# Patient Record
Sex: Male | Born: 1940 | Race: White | Hispanic: No | Smoking: Former smoker
Health system: Southern US, Community
[De-identification: ages and names within clinical notes are randomized; demographics above are authoritative.]

## PROBLEM LIST (undated history)

## (undated) DIAGNOSIS — I639 Cerebral infarction, unspecified: Secondary | ICD-10-CM

## (undated) DIAGNOSIS — I6529 Occlusion and stenosis of unspecified carotid artery: Secondary | ICD-10-CM

## (undated) DIAGNOSIS — T8859XA Other complications of anesthesia, initial encounter: Secondary | ICD-10-CM

## (undated) DIAGNOSIS — T4145XA Adverse effect of unspecified anesthetic, initial encounter: Secondary | ICD-10-CM

## (undated) DIAGNOSIS — I10 Essential (primary) hypertension: Secondary | ICD-10-CM

## (undated) HISTORY — DX: Cerebral infarction, unspecified: I63.9

## (undated) HISTORY — PX: NECK SURGERY: SHX720

## (undated) HISTORY — PX: PROSTATE SURGERY: SHX751

## (undated) HISTORY — DX: Essential (primary) hypertension: I10

## (undated) HISTORY — DX: Occlusion and stenosis of unspecified carotid artery: I65.29

## (undated) HISTORY — PX: LAPAROSCOPIC NEPHRECTOMY: SUR781

---
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
## 2019-05-24 ENCOUNTER — Encounter: Payer: Self-pay | Admitting: Cardiovascular Disease

## 2019-05-25 ENCOUNTER — Other Ambulatory Visit: Payer: Self-pay | Admitting: Family Medicine

## 2019-05-25 ENCOUNTER — Ambulatory Visit
Admission: RE | Admit: 2019-05-25 | Discharge: 2019-05-25 | Disposition: A | Discharge status: Home / Self Care | Payer: Medicare Other | Source: Ambulatory Visit | Attending: Family Medicine | Admitting: Family Medicine

## 2019-05-25 DIAGNOSIS — R55 Syncope and collapse: Secondary | ICD-10-CM

## 2019-05-25 IMAGING — CT CT NECK W/O CM
4 of 5 series · 14 of 33 positions shown, 16 images · non-contrast
Comparison: Head CT today reported separately.

CLINICAL DATA: 78-year-old male with syncope and dizziness this
month. Posterior neck pain. Dysphagia.

EXAM:
CT NECK WITHOUT CONTRAST
TECHNIQUE: Multidetector CT imaging of the neck was performed following the
standard protocol without intravenous contrast.

[Series 3: neck 2.00 br40 s3 soft tissue · axial · 0.49mm/px · z∈[-734,-696]mm · 2 of 113 slices shown]
[im 19/113  soft-tissue]
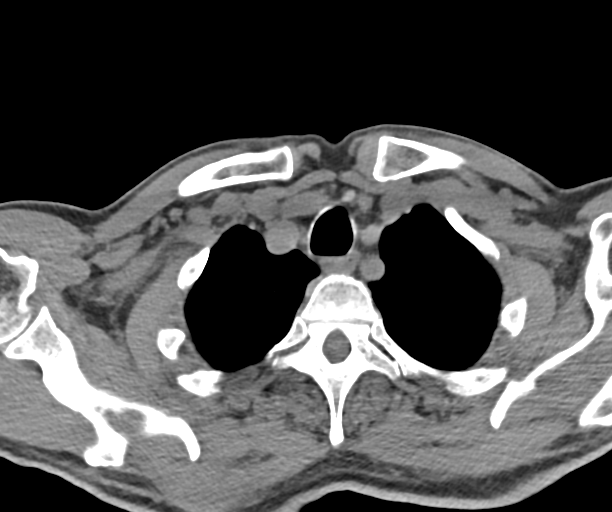
[im 38/113  soft-tissue]
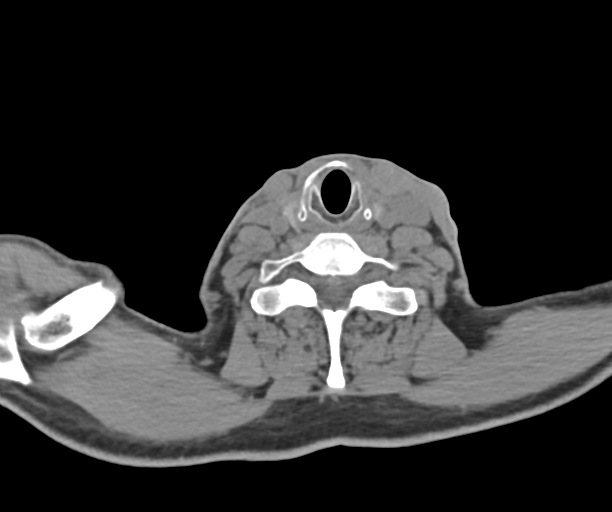

[Series 5: neck 2.00 br44 s3 soft tissue · sagittal · 0.44mm/px · 5 of 149 slices shown, 6 images]
[im 50/149  bone]
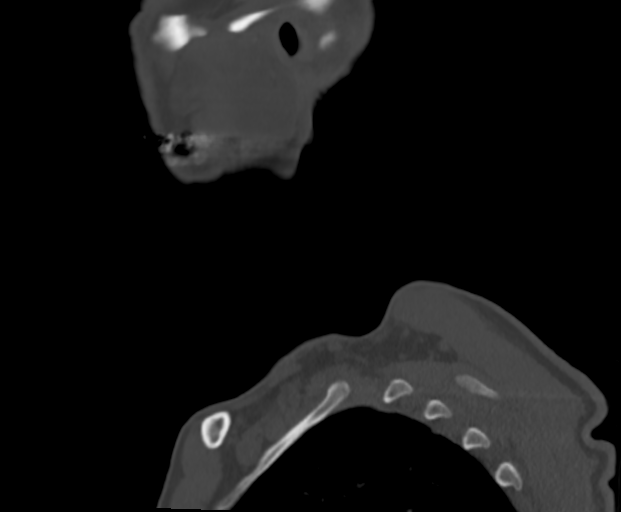
[im 62/149  bone]
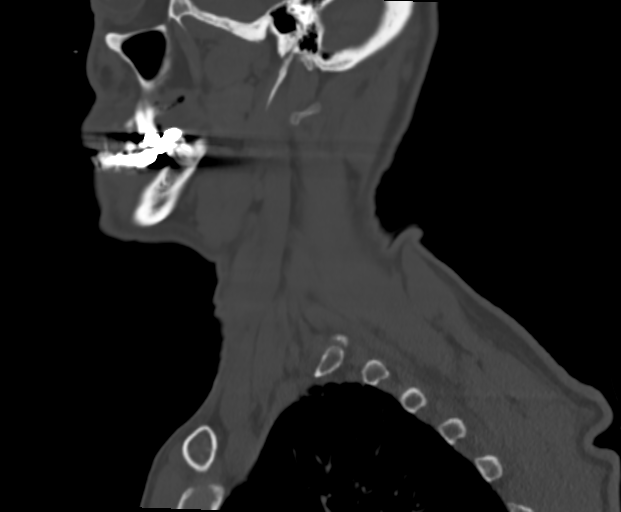
[im 75/149  soft-tissue]
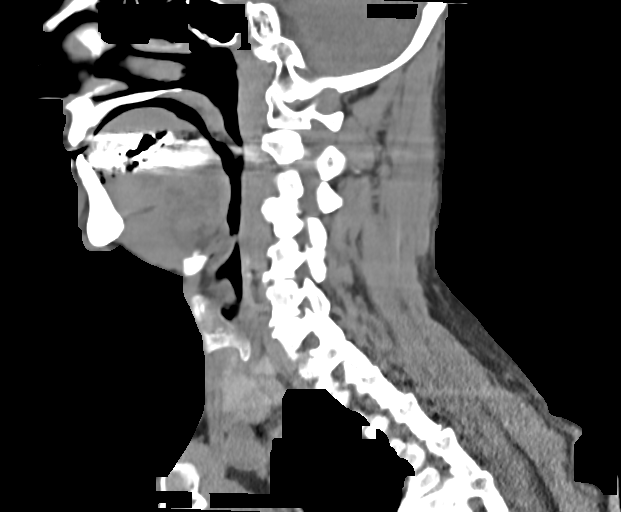
[im 75/149  bone]
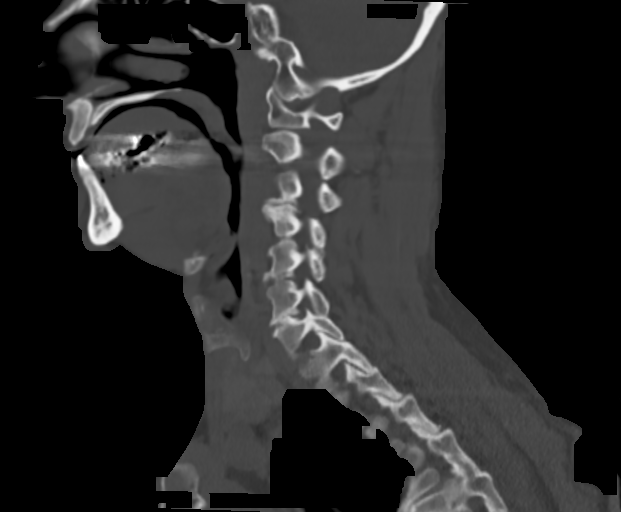
[im 87/149  bone]
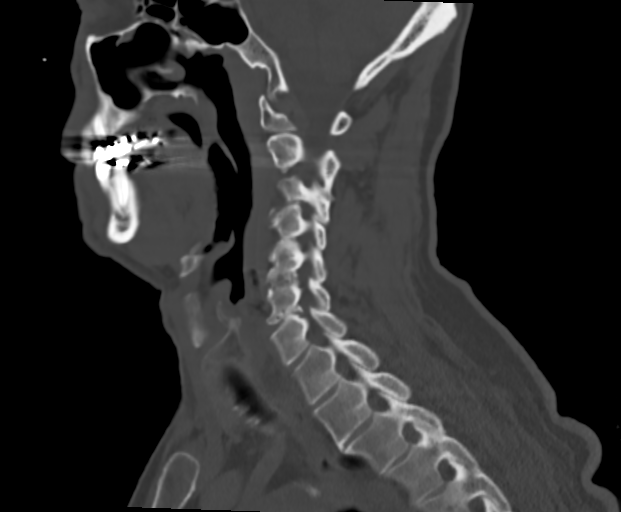
[im 99/149  bone]
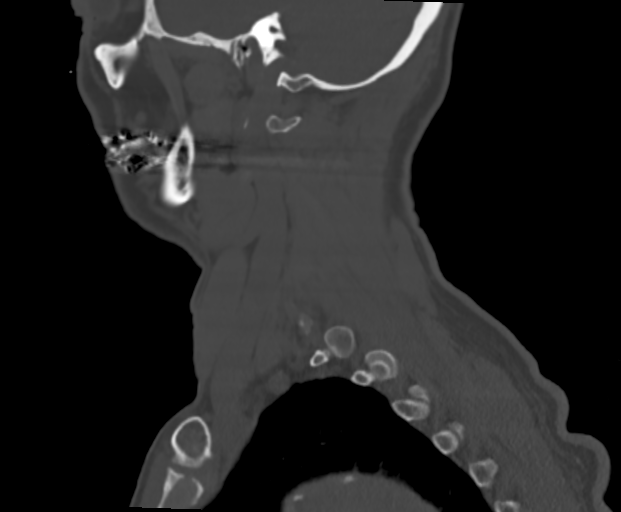

[Series 7: neck 2.00 br40 s3 oropharynx (person_name) · coronal · 0.44mm/px · 3 of 124 slices shown]
[im 25/124  bone]
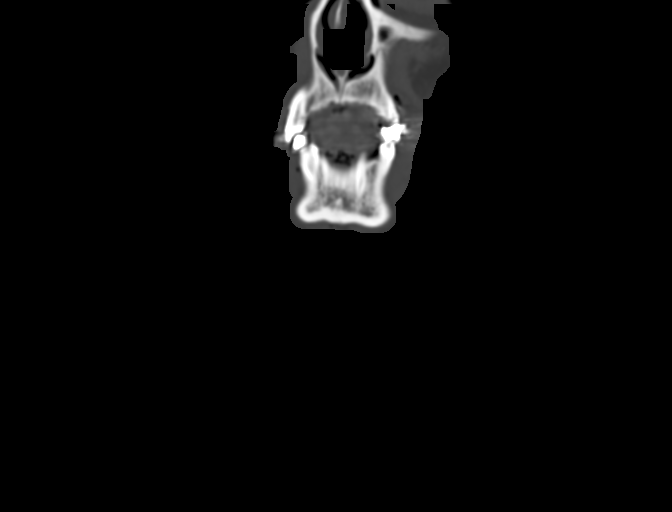
[im 50/124  bone]
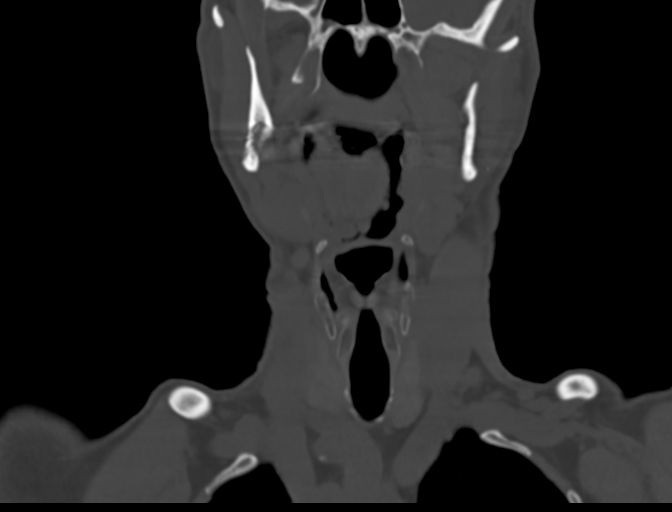
[im 74/124  bone]
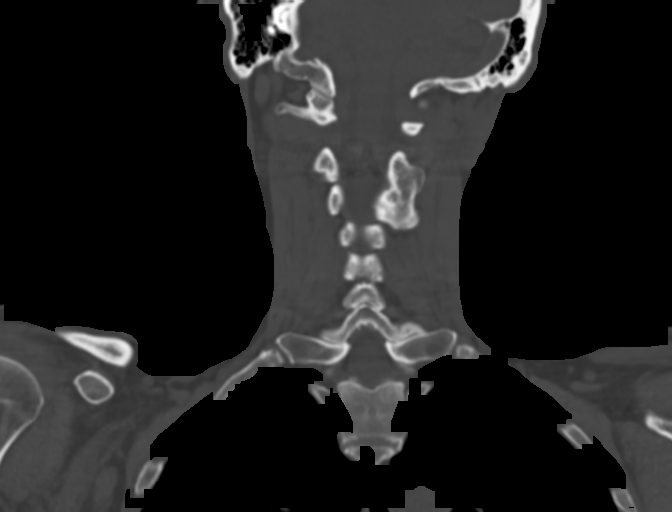

[Series 9: neck 2.00 br60 s3 bone · axial · 0.54mm/px · z∈[-708,-574]mm · 4 of 113 slices shown, 5 images]
[im 23/113  soft-tissue]
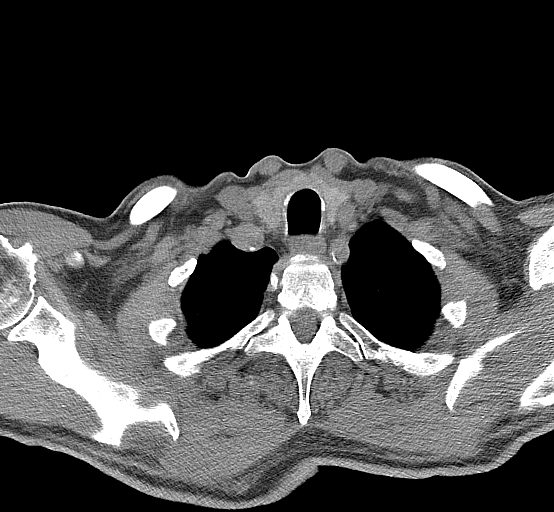
[im 23/113  bone]
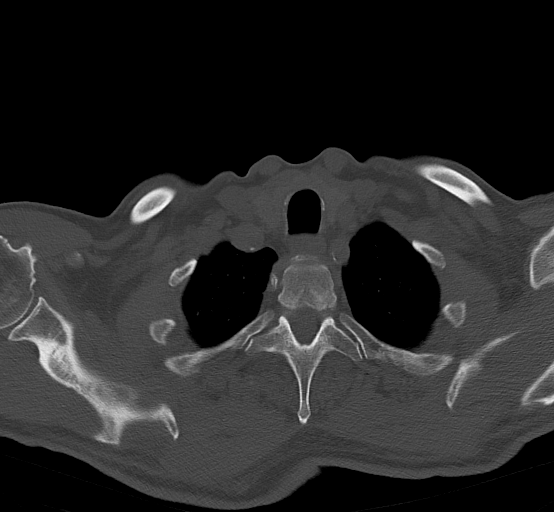
[im 45/113  bone]
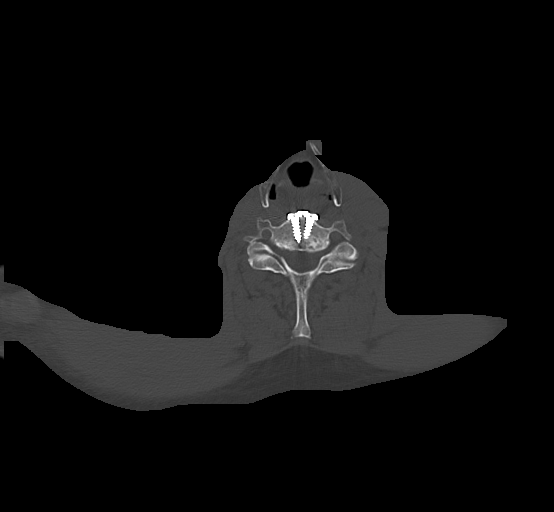
[im 68/113  bone]
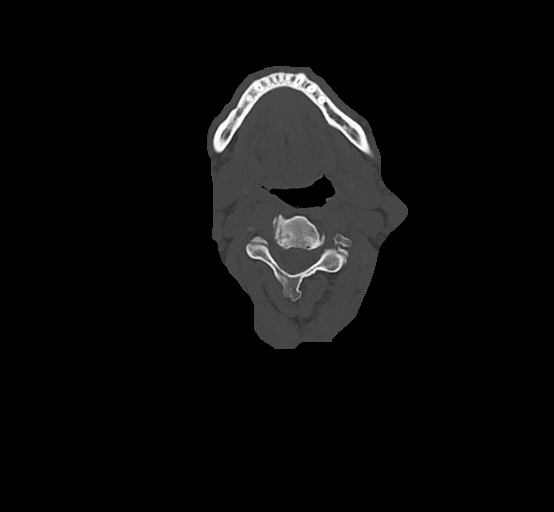
[im 90/113  bone]
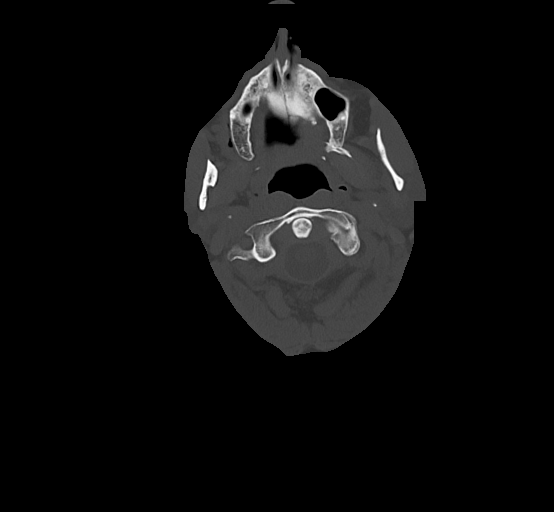

[14 of 33 positions shown; findings below may reference images not displayed]

FINDINGS: Pharynx and larynx: Noncontrast larynx and pharynx soft tissue
contours are within normal limits. Negative noncontrast
parapharyngeal and retropharyngeal spaces.

Decompressed and normal cervical esophagus. Negative visible upper
thoracic esophagus.

Salivary glands: Negative noncontrast sublingual space, bilateral
submandibular glands, parotid glands. No sialolithiasis identified.

Thyroid: Negative.

Lymph nodes: Negative. No cervical lymphadenopathy.

Vascular: Vascular patency is not evaluated in the absence of IV
contrast. Bilateral ICA origins soft and calcified plaque is
evident.

Limited intracranial: Negative.

Visualized orbits: Minimally included, negative.

Mastoids and visualized paranasal sinuses: Paranasal sinuses are
well pneumatized. There is minor mucosal thickening and bubbly
opacity in the anterior left sphenoid. The visible tympanic cavities
and mastoids are clear.

Skeleton: Previous C5-C6 cervical ACDF with solid interbody
arthrodesis and no evidence of hardware loosening. The C5 hardware
protrudes anteriorly only slightly (sagittal image 81). Advanced
disc and endplate degeneration at the adjacent C4-C5 and C6-C7
segments each with some vacuum disc. Similar degeneration at C3-C4.
But superimposed left side C2-C3 interbody and posterior element
ankylosis.

No superimposed No acute osseous abnormality identified.

Upper chest: Partially visible aortic arch calcified plaque. No
superior mediastinal lymphadenopathy. Negative visible upper lungs.
IMPRESSION: 1. No acute or inflammatory process identified in the noncontrast
neck. No explanation for dysphagia, esophagram may be valuable.

2. Previous C5-C6 cervical ACDF with solid interbody arthrodesis and
no evidence of hardware loosening.
Superimposed C2-C3 ankylosis.
Subsequent severe C3-C4, C4-C5, and C6-C7 disc and endplate
degeneration.

3. Aortic Atherosclerosis ([Y8]-[Y8]).

## 2019-05-25 IMAGING — CT CT HEAD W/O CM
4 series · 15 of 47 positions shown, 17 images · non-contrast
Comparison: None.

CLINICAL DATA: Syncopal episode [DATE]. Dizziness. History of
CVA.

EXAM:
CT HEAD WITHOUT CONTRAST
TECHNIQUE: Contiguous axial images were obtained from the base of the skull
through the vertex without intravenous contrast.

[Series 2: head 5.00 hr40 s3 axial ibhc · axial · 0.36mm/px · z∈[-548,-428]mm · 7 of 34 slices shown, 9 images]
[im 5/34  brain]
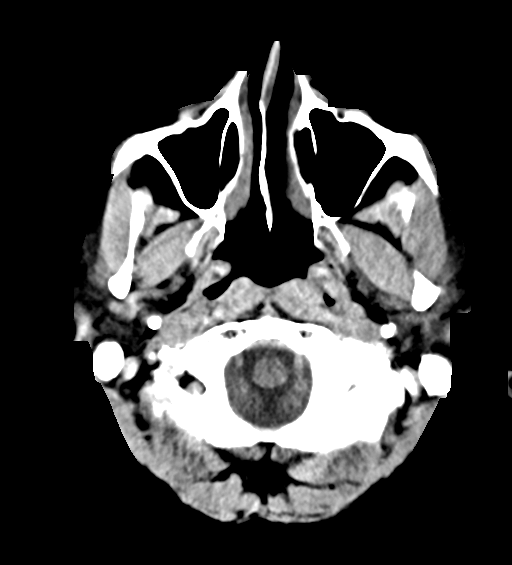
[im 5/34  bone]
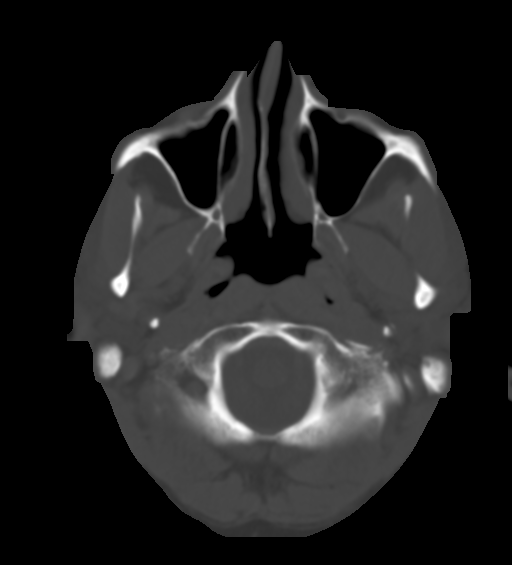
[im 9/34  brain]
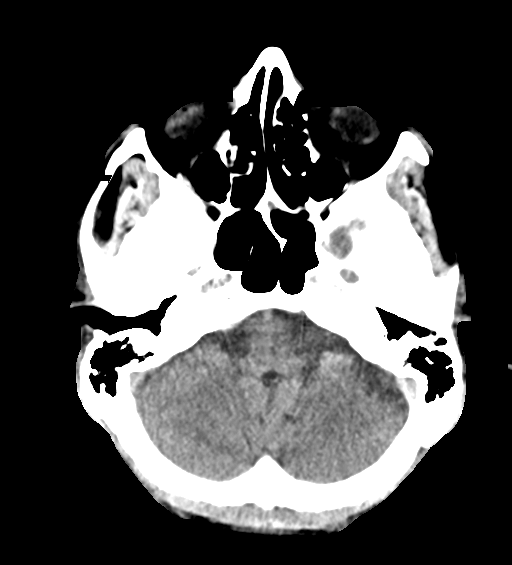
[im 13/34  brain]
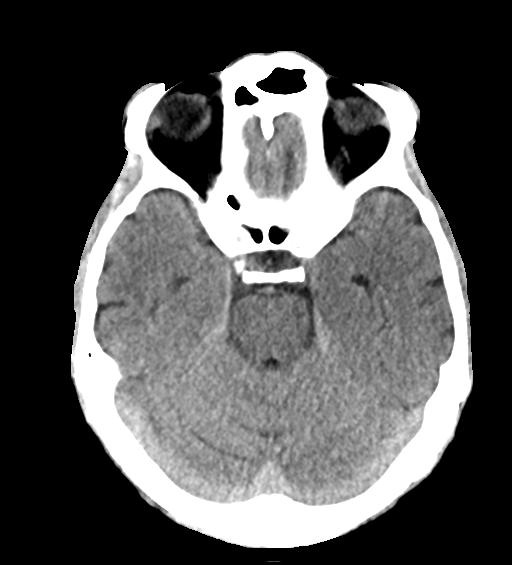
[im 17/34  brain]
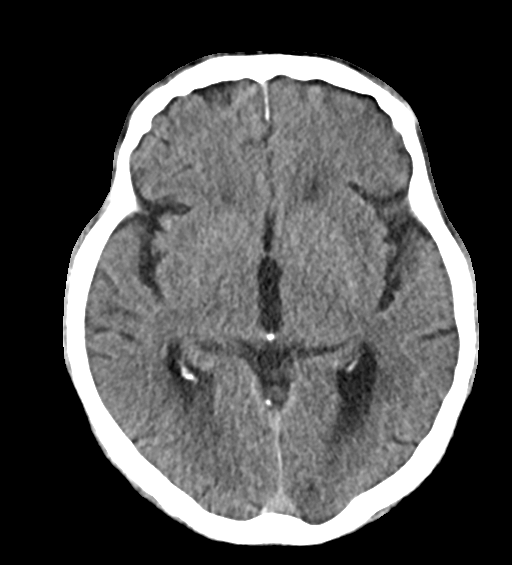
[im 21/34  brain]
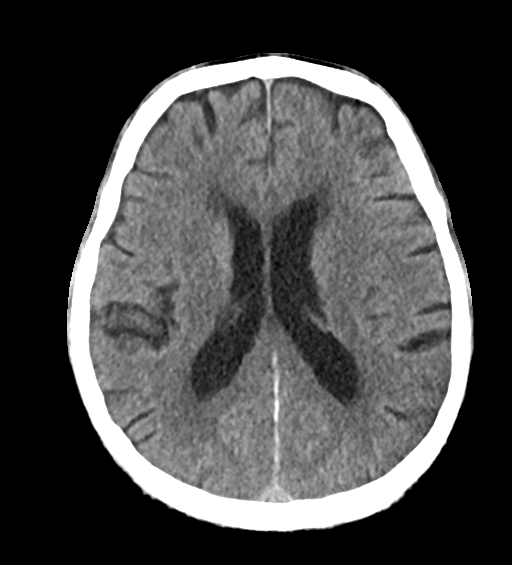
[im 21/34  bone]
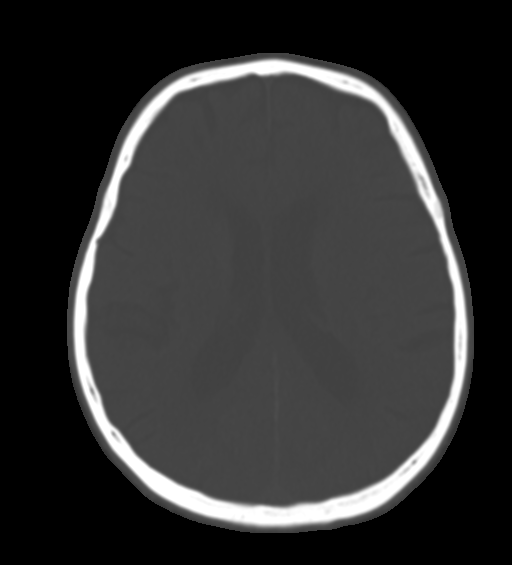
[im 25/34  brain]
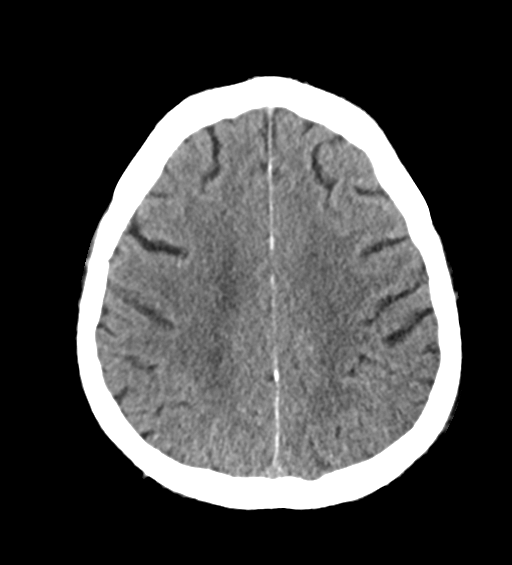
[im 29/34  brain]
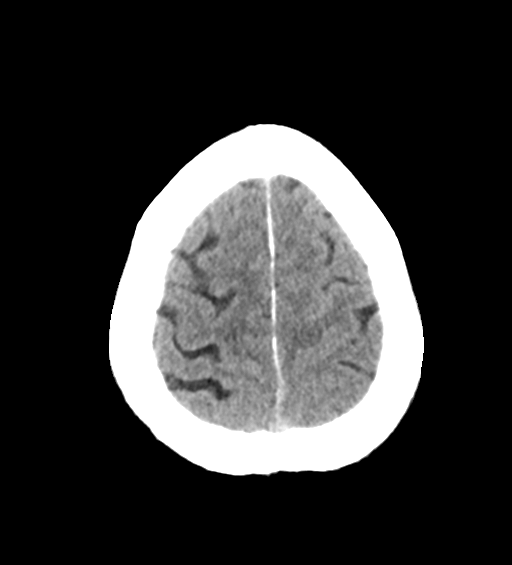

[Series 4: head 2.00 hr60 s3 axial bone · axial · 0.36mm/px · z∈[-553,-537]mm · 2 of 85 slices shown]
[im 9/85  bone]
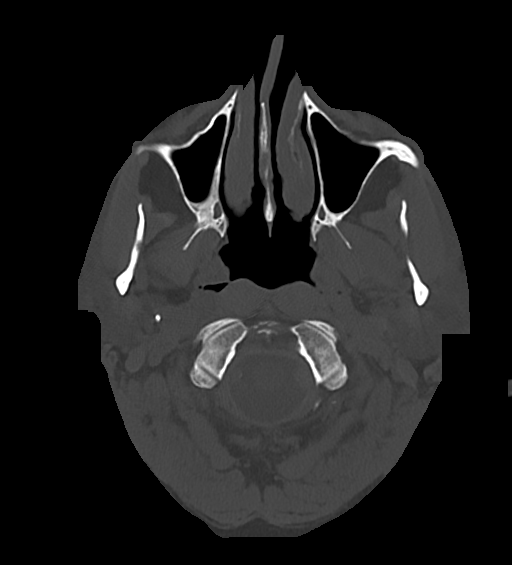
[im 17/85  bone]
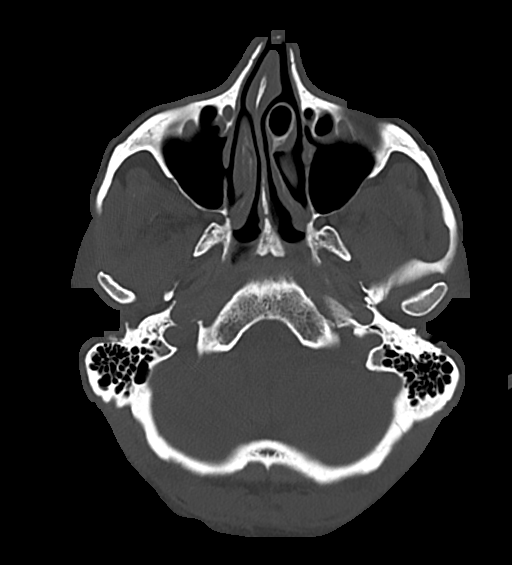

[Series 6: head 3.00 hr40 s3 sag · sagittal · 0.33mm/px · 3 of 92 slices shown]
[im 31/92  brain]
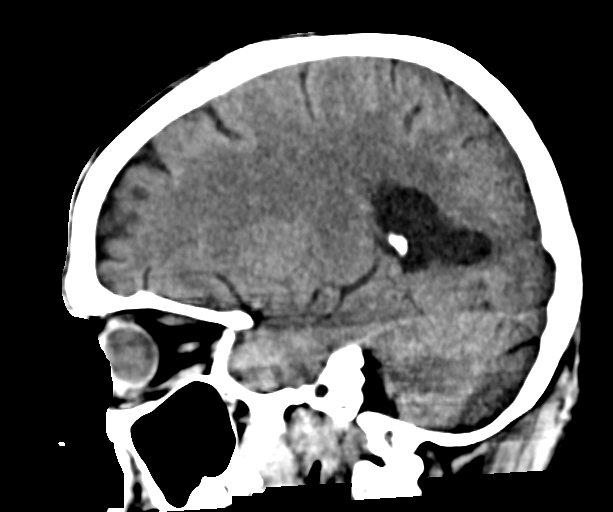
[im 46/92  brain]
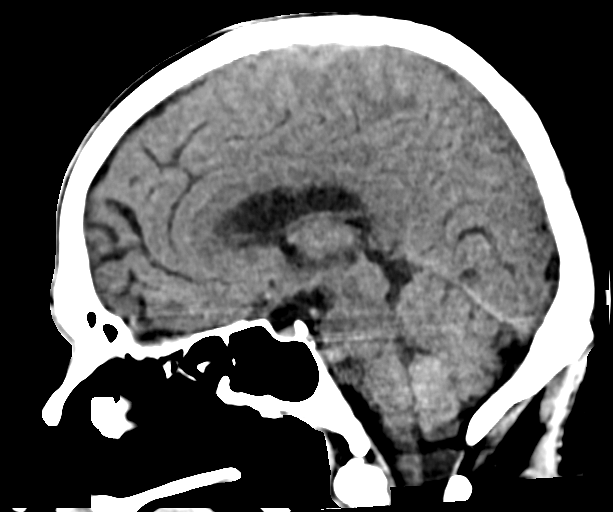
[im 61/92  brain]
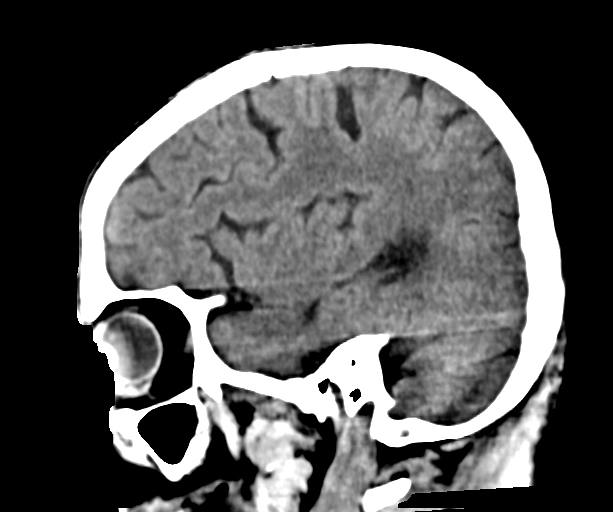

[Series 8: head 3.00 hr40 s3 cor · coronal · 0.33mm/px · 3 of 101 slices shown]
[im 34/101  brain]
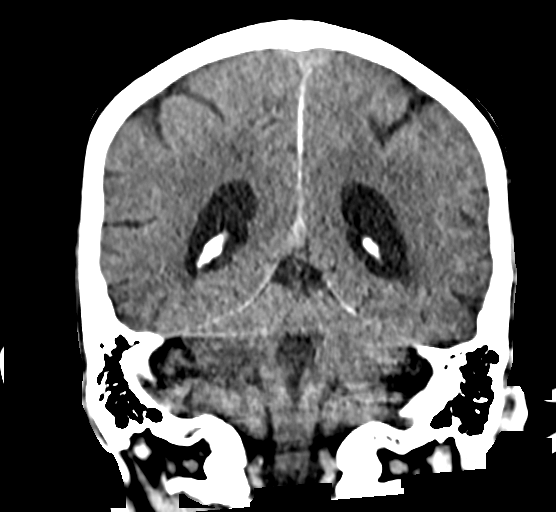
[im 45/101  brain]
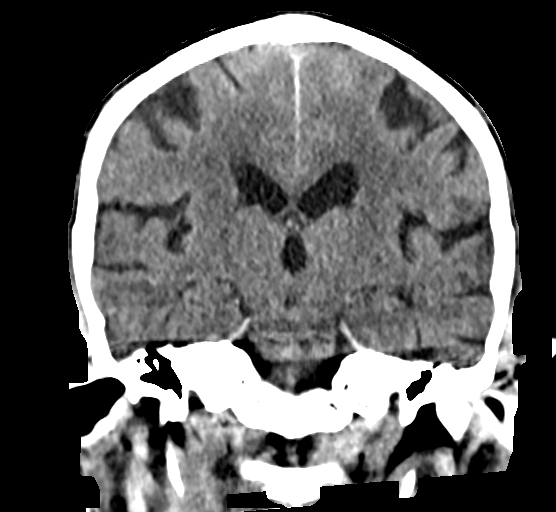
[im 56/101  brain]
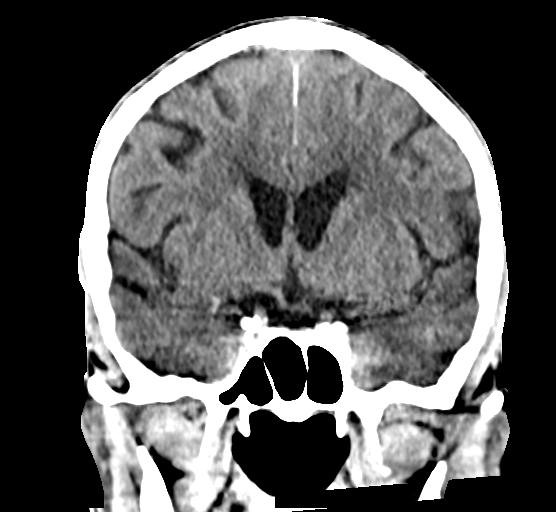

[15 of 47 positions shown; findings below may reference images not displayed]

FINDINGS: Brain: No intracranial hemorrhage, mass effect, or midline shift.
Age related atrophy with moderate chronic small vessel ischemia. No
hydrocephalus. The basilar cisterns are patent. Tiny remote lacunar
infarct in the left basal ganglia versus prominent perivascular
space. No cortical encephalomalacia. No evidence of territorial
infarct or acute ischemia. No extra-axial or intracranial fluid
collection.

Vascular: Atherosclerosis of skullbase vasculature without
hyperdense vessel or abnormal calcification.

Skull: No fracture or focal lesion.

Sinuses/Orbits: Minor mucosal thickening of scattered ethmoid air
cells. No sinus fluid levels. Mastoid air cells are clear. The
visualized orbits are unremarkable.

Other: None.
IMPRESSION: 1. No acute intracranial abnormality.
2. Age related atrophy and chronic small vessel ischemia. Tiny
remote lacunar infarct in the left basal ganglia versus prominent
perivascular space.

## 2019-06-05 ENCOUNTER — Other Ambulatory Visit: Payer: Self-pay

## 2019-06-05 ENCOUNTER — Ambulatory Visit (INDEPENDENT_AMBULATORY_CARE_PROVIDER_SITE_OTHER): Payer: Medicare Other | Admitting: Cardiovascular Disease

## 2019-06-05 ENCOUNTER — Encounter: Payer: Self-pay | Admitting: Cardiovascular Disease

## 2019-06-05 VITALS — BP 160/86 | HR 73 | Ht 67.0 in | Wt 149.4 lb

## 2019-06-05 DIAGNOSIS — I10 Essential (primary) hypertension: Secondary | ICD-10-CM

## 2019-06-05 DIAGNOSIS — E785 Hyperlipidemia, unspecified: Secondary | ICD-10-CM | POA: Insufficient documentation

## 2019-06-05 DIAGNOSIS — R55 Syncope and collapse: Secondary | ICD-10-CM

## 2019-06-05 DIAGNOSIS — E782 Mixed hyperlipidemia: Secondary | ICD-10-CM

## 2019-06-05 DIAGNOSIS — R0989 Other specified symptoms and signs involving the circulatory and respiratory systems: Secondary | ICD-10-CM

## 2019-06-05 NOTE — Assessment & Plan Note (Signed)
Jonathan Savage was referred to me by Dr. Laurine Blazer for recent episode of syncope.  He was doing some work outside, [employer] and trimming some bushes and briefly lost consciousness.  He attributes this to overheating and dehydration.  At this point, I am not further evaluate.

## 2019-06-05 NOTE — Progress Notes (Signed)
06/05/2019 Jonathan Savage   03-09-1940  [id number]  Primary Physician No primary care provider on file. Primary Cardiologist: Runell Gess MD Milagros Loll, [place], [place]  HPI:  Jonathan Savage is a 79 y.o. thin appearing widowed Caucasian male father of 3 daughters, grandfather of 5 grandchildren referred by Dr. Paulino Rily, his PCP, for a brief episode of syncope.  He is retired Music therapist.  He was recently relocated from [name] to [place] to be closer to his daughter Camelia Eng.  His cardiac risk factor profile is notable for treated hypertension and untreated hyperlipidemia.  Father did have bypass surgery.  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  He is never had a heart attack.  Denies chest pain or shortness of breath.  He had a recent episode of brief loss of consciousness while trimming bushes in the yard during the hot summer day.  He attributes this to overheating and dehydration.   Current Meds  Medication Sig  . losartan (COZAAR) 100 MG tablet Take 100 mg by mouth daily.     No Known Allergies  Social History   Socioeconomic History  . Marital status: Widowed    Spouse name: Not on file  . Number of children: Not on file  . Years of education: Not on file  . Highest education level: Not on file  Occupational History  . Not on file  Tobacco Use  . Smoking status: Former Games developer  . Smokeless tobacco: Never Used  Substance and Sexual Activity  . Alcohol use: Never  . Drug use: Not on file  . Sexual activity: Not on file  Other Topics Concern  . Not on file  Social History Narrative  . Not on file   Social Determinants of Health   Financial Resource Strain:   . Difficulty of Paying Living Expenses:   Food Insecurity:   . Worried About Programme researcher, broadcasting/film/video in the Last Year:   . Barista in the Last Year:   Transportation Needs:   . Freight forwarder (Medical):   Marland Kitchen Lack of  Transportation (Non-Medical):   Physical Activity:   . Days of Exercise per Week:   . Minutes of Exercise per Session:   Stress:   . Feeling of Stress :   Social Connections:   . Frequency of Communication with Friends and Family:   . Frequency of Social Gatherings with Friends and Family:   . Attends Religious Services:   . Active Member of Clubs or Organizations:   . Attends Banker Meetings:   Marland Kitchen Marital Status:   Intimate Partner Violence:   . Fear of Current or Ex-Partner:   . Emotionally Abused:   Marland Kitchen Physically Abused:   . Sexually Abused:      Review of Systems: General: negative for chills, fever, night sweats or weight changes.  Cardiovascular: negative for chest pain, dyspnea on exertion, edema, orthopnea, palpitations, paroxysmal nocturnal dyspnea or shortness of breath Dermatological: negative for rash Respiratory: negative for cough or wheezing Urologic: negative for hematuria Abdominal: negative for nausea, vomiting, diarrhea, bright red blood per rectum, melena, or hematemesis Neurologic: negative for visual changes, syncope, or dizziness All other systems reviewed and are otherwise negative except as noted above.    Blood pressure (!) 160/86, pulse 73, height 5\' 7"  (1.702 m), weight 149 lb 6.4 oz (67.8 kg), SpO2 97 %.  General appearance: alert and  no distress Neck: no adenopathy, no JVD, supple, symmetrical, trachea midline, thyroid not enlarged, symmetric, no tenderness/mass/nodules and Left carotid bruit Lungs: clear to auscultation bilaterally Heart: regular rate and rhythm, S1, S2 normal, no murmur, click, rub or gallop Extremities: extremities normal, atraumatic, no cyanosis or edema Pulses: 2+ and symmetric Skin: Skin color, texture, turgor normal. No rashes or lesions Neurologic: Alert and oriented X 3, normal strength and tone. Normal symmetric reflexes. Normal coordination and gait  EKG sinus rhythm at 73 with nonspecific ST and T wave  changes.  I personally reviewed this EKG.  ASSESSMENT AND PLAN:   Syncope and collapse Mr. Baumler was referred to me by Dr. Cheron Schaumann for recent episode of syncope.  He was doing some work outside, [employer] and trimming some bushes and briefly lost consciousness.  He attributes this to overheating and dehydration.  At this point, I am not further evaluate.  Carotid bruit We will check carotid Doppler studies  Hyperlipidemia History of hyperlipidemia intolerant to statin therapy.  His most recent lipid profile performed 05/21/2019 revealed total cholesterol 187, LDL 126 and HDL 38.  Essential hypertension History of essential hypertension on losartan with blood pressure measured today of 160/86.  Of asked him to keep a 30-day blood pressure log and come back in 4 weeks to review with one of our Pharm.D.'s and make further recommendations.      Lorretta Harp MD FACP,FACC,FAHA, [facility] 06/05/2019 4:15 PM

## 2019-06-05 NOTE — Patient Instructions (Signed)
Medication Instructions:  Your physician recommends that you continue on your current medications as directed. Please refer to the Current Medication list given to you today.  *If you need a refill on your cardiac medications before your next appointment, please call your pharmacy*  Testing/Procedures: Your physician has requested that you have a carotid duplex. This test is an ultrasound of the carotid arteries in your neck. It looks at blood flow through these arteries that supply the brain with blood. Allow one hour for this exam. There are no restrictions or special instructions. -- this is done at Dr. Hazle Coca office   Follow-Up: At [facility], you and your health needs are our priority.  As part of our continuing mission to provide you with exceptional heart care, we have created designated Provider Care Teams.  These Care Teams include your primary Cardiologist (physician) and Advanced Practice Providers (APPs -  Physician Assistants and Nurse Practitioners) who all work together to provide you with the care you need, when you need it.  We recommend signing up for the patient portal called "MyChart".  Sign up information is provided on this After Visit Summary.  MyChart is used to connect with patients for Virtual Visits (Telemedicine).  Patients are able to view lab/test results, encounter notes, upcoming appointments, etc.  Non-urgent messages can be sent to your provider as well.   To learn more about what you can do with MyChart, go to [email or web address].    Your next appointment:   6 month(s)  The format for your next appointment:   In Person  Provider:   You may see Dr. Allyson Sabal or one of the following Advanced Practice Providers on your designated Care Team:    Corine Shelter, PA-C  [place], [place]  Edd Fabian, [place]    Other Instructions Dr. Allyson Sabal has requested that you schedule an appointment with one of our clinical pharmacists for a blood pressure check  appointment in about 4 weeks with our clinical pharmacists.  Please monitor your blood pressure (BP) at home, please bring your BP cuff and your BP readings with you to this appointment  HOW TO TAKE YOUR BLOOD PRESSURE:  Rest 5 minutes before taking your blood pressure.  Don't smoke or drink caffeinated beverages for at least 30 minutes before.  Take your blood pressure before (not after) you eat.  Sit comfortably with your back supported and both feet on the floor (don't cross your legs).  Elevate your arm to heart level on a table or a desk.  Use the proper sized cuff. It should fit smoothly and snugly around your bare upper arm. There should be enough room to slip a fingertip under the cuff. The bottom edge of the cuff should be 1 inch above the crease of the elbow.  Ideally, take 3 measurements at one sitting and record the average.

## 2019-06-05 NOTE — Assessment & Plan Note (Signed)
We will check carotid Doppler studies 

## 2019-06-05 NOTE — Assessment & Plan Note (Signed)
History of essential hypertension on losartan with blood pressure measured today of 160/86.  Of asked him to keep a 30-day blood pressure log and come back in 4 weeks to review with one of our Pharm.D.'s and make further recommendations.

## 2019-06-05 NOTE — Assessment & Plan Note (Signed)
History of hyperlipidemia intolerant to statin therapy.  His most recent lipid profile performed 05/21/2019 revealed total cholesterol 187, LDL 126 and HDL 38.

## 2019-06-06 ENCOUNTER — Other Ambulatory Visit: Payer: Self-pay

## 2019-06-06 ENCOUNTER — Other Ambulatory Visit: Payer: Self-pay | Admitting: Cardiovascular Disease

## 2019-06-06 DIAGNOSIS — R0989 Other specified symptoms and signs involving the circulatory and respiratory systems: Secondary | ICD-10-CM

## 2019-06-06 DIAGNOSIS — R55 Syncope and collapse: Secondary | ICD-10-CM

## 2019-06-07 ENCOUNTER — Other Ambulatory Visit: Payer: Self-pay

## 2019-06-07 ENCOUNTER — Ambulatory Visit (HOSPITAL_COMMUNITY)
Admission: RE | Admit: 2019-06-07 | Discharge: 2019-06-07 | Disposition: A | Discharge status: Home / Self Care | Payer: Medicare Other | Source: Ambulatory Visit | Attending: Cardiology | Admitting: Cardiology

## 2019-06-07 DIAGNOSIS — R0989 Other specified symptoms and signs involving the circulatory and respiratory systems: Secondary | ICD-10-CM

## 2019-06-07 DIAGNOSIS — R55 Syncope and collapse: Secondary | ICD-10-CM

## 2019-06-08 ENCOUNTER — Encounter: Payer: Self-pay | Admitting: Cardiovascular Disease

## 2019-06-08 ENCOUNTER — Ambulatory Visit (INDEPENDENT_AMBULATORY_CARE_PROVIDER_SITE_OTHER): Payer: Medicare Other | Admitting: Cardiovascular Disease

## 2019-06-08 VITALS — BP 166/84 | HR 70 | Ht 67.0 in | Wt 149.2 lb

## 2019-06-08 DIAGNOSIS — I739 Peripheral vascular disease, unspecified: Secondary | ICD-10-CM

## 2019-06-08 DIAGNOSIS — Z01810 Encounter for preprocedural cardiovascular examination: Secondary | ICD-10-CM | POA: Diagnosis not present

## 2019-06-08 DIAGNOSIS — I6529 Occlusion and stenosis of unspecified carotid artery: Secondary | ICD-10-CM

## 2019-06-08 NOTE — Progress Notes (Signed)
06/08/2019 [name]   [birth year]  [id number]  Primary Physician London Pepper, MD Primary Cardiologist: Lorretta Harp MD Lupe Carney, [place]  HPI:  Jonathan Savage is a 79 y.o.    thin appearing widowed Caucasian male father of 3 daughters, grandfather of 5 grandchildren referred by Dr. Stephanie Acre, his PCP, for a brief episode of syncope.  He is retired Games developer.  He was recently relocated from [place] to [place] to be closer to his daughter Jonathan Savage.  I just saw him in the office on 06/05/2019.  His cardiac risk factor profile is notable for treated hypertension and untreated hyperlipidemia.  Father did have bypass surgery.  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  He is never had a heart attack.  Denies chest pain or shortness of breath.  He had a recent episode of brief loss of consciousness while trimming bushes in the yard during the hot summer day.  He attributes this to overheating and dehydration.  I obtained carotid Dopplers on him on 06/07/2019 that showed high-grade left ICA stenosis.  He is also has been complaining claudication.  He denies chest pain or shortness of breath.   Current Meds  Medication Sig  . losartan (COZAAR) 100 MG tablet Take 100 mg by mouth daily.     No Known Allergies  Social History   Socioeconomic History  . Marital status: Widowed    Spouse name: Not on file  . Number of children: Not on file  . Years of education: Not on file  . Highest education level: Not on file  Occupational History  . Not on file  Tobacco Use  . Smoking status: Former Research scientist (life sciences)  . Smokeless tobacco: Never Used  Substance and Sexual Activity  . Alcohol use: Never  . Drug use: Not on file  . Sexual activity: Not on file  Other Topics Concern  . Not on file  Social History Narrative  . Not on file   Social Determinants of Health   Financial Resource Strain:   . Difficulty of Paying  Living Expenses:   Food Insecurity:   . Worried About Charity fundraiser in the Last Year:   . Arboriculturist in the Last Year:   Transportation Needs:   . Film/video editor (Medical):   Marland Kitchen Lack of Transportation (Non-Medical):   Physical Activity:   . Days of Exercise per Week:   . Minutes of Exercise per Session:   Stress:   . Feeling of Stress :   Social Connections:   . Frequency of Communication with Friends and Family:   . Frequency of Social Gatherings with Friends and Family:   . Attends Religious Services:   . Active Member of Clubs or Organizations:   . Attends Archivist Meetings:   Marland Kitchen Marital Status:   Intimate Partner Violence:   . Fear of Current or Ex-Partner:   . Emotionally Abused:   Marland Kitchen Physically Abused:   . Sexually Abused:      Review of Systems: General: negative for chills, fever, night sweats or weight changes.  Cardiovascular: negative for chest pain, dyspnea on exertion, edema, orthopnea, palpitations, paroxysmal nocturnal dyspnea or shortness of breath Dermatological: negative for rash Respiratory: negative for cough or wheezing Urologic: negative for hematuria Abdominal: negative for nausea, vomiting, diarrhea, bright red blood per rectum, melena, or hematemesis Neurologic: negative for visual changes, syncope, or  dizziness All other systems reviewed and are otherwise negative except as noted above.    Blood pressure (!) 166/84, pulse 70, height 5\' 7"  (1.702 m), weight 149 lb 3.2 oz (67.7 kg), SpO2 95 %.  General appearance: alert and no distress Neck: no adenopathy, no JVD, supple, symmetrical, trachea midline, thyroid not enlarged, symmetric, no tenderness/mass/nodules and Left carotid bruit Lungs: clear to auscultation bilaterally Heart: regular rate and rhythm, S1, S2 normal, no murmur, click, rub or gallop Extremities: extremities normal, atraumatic, no cyanosis or edema Pulses: 2+ and symmetric Skin: Skin color, texture,  turgor normal. No rashes or lesions Neurologic: Alert and oriented X 3, normal strength and tone. Normal symmetric reflexes. 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  I am going to get lower extremity arterial Dopplers and ABIs to further evaluate  Carotid bruit Recent carotid Dopplers performed 06/07/2019 because of an auscultated bruit reveals a high-grade left ICA stenosis.  He is asymptomatic from this.  I am referring him to Dr. 08/07/2019 for consideration of carotid endarterectomy versus stenting.  I am going to get a Lexiscan Myoview to restratify him.      Myra Gianotti MD FACP,FACC,FAHA, [facility] 06/08/2019 1:55 PM

## 2019-06-08 NOTE — Assessment & Plan Note (Signed)
Recent carotid Dopplers performed 06/07/2019 because of an auscultated bruit reveals a high-grade left ICA stenosis.  He is asymptomatic from this.  I am referring him to Dr. Myra Gianotti for consideration of carotid endarterectomy versus stenting.  I am going to get a Lexiscan Myoview to restratify him.

## 2019-06-08 NOTE — Patient Instructions (Signed)
Medication Instructions:  Your physician recommends that you continue on your current medications as directed. Please refer to the Current Medication list given to you today.  *If you need a refill on your cardiac medications before your next appointment, please call your pharmacy*   Lab Work: NONE If you have labs (blood work) drawn today and your tests are completely normal, you will receive your results only by: Marland Kitchen MyChart Message (if you have MyChart) OR . A paper copy in the mail If you have any lab test that is abnormal or we need to change your treatment, we will call you to review the results.   Testing/Procedures: Lower Extremity Arterial Doppler & ABI to assess claudication (leg pain)  Lexiscan Myoview - stress test   Follow-Up: At [facility], you and your health needs are our priority.  As part of our continuing mission to provide you with exceptional heart care, we have created designated Provider Care Teams.  These Care Teams include your primary Cardiologist (physician) and Advanced Practice Providers (APPs -  Physician Assistants and Nurse Practitioners) who all work together to provide you with the care you need, when you need it.  We recommend signing up for the patient portal called "MyChart".  Sign up information is provided on this After Visit Summary.  MyChart is used to connect with patients for Virtual Visits (Telemedicine).  Patients are able to view lab/test results, encounter notes, upcoming appointments, etc.  Non-urgent messages can be sent to your provider as well.   To learn more about what you can do with MyChart, go to [email or web address].    Your next appointment:   3 month(s)  The format for your next appointment:   In Person  Provider:   You may see Nanetta Batty, MD or one of the following Advanced Practice Providers on your designated Care Team:    Corine Shelter, PA-C  [place], [place]  Edd Fabian, [place]    Other  Instructions  You have been referred to Dr. Coral Else with [facility] [address] [place], [place] [address] [phone]

## 2019-06-08 NOTE — Assessment & Plan Note (Signed)
Patient complains of symptoms compatible with claudication.  I am going to get lower extremity arterial Dopplers and ABIs to further evaluate

## 2019-06-11 ENCOUNTER — Emergency Department (HOSPITAL_COMMUNITY)
Admission: EM | Admit: 2019-06-11 | Discharge: 2019-06-11 | Disposition: A | Discharge status: Home / Self Care | Payer: No Typology Code available for payment source | Attending: Emergency Medicine | Admitting: Emergency Medicine

## 2019-06-11 ENCOUNTER — Emergency Department (HOSPITAL_COMMUNITY): Payer: No Typology Code available for payment source

## 2019-06-11 ENCOUNTER — Other Ambulatory Visit: Payer: Self-pay

## 2019-06-11 DIAGNOSIS — R200 Anesthesia of skin: Secondary | ICD-10-CM | POA: Diagnosis not present

## 2019-06-11 DIAGNOSIS — I6522 Occlusion and stenosis of left carotid artery: Secondary | ICD-10-CM | POA: Insufficient documentation

## 2019-06-11 DIAGNOSIS — Z87891 Personal history of nicotine dependence: Secondary | ICD-10-CM | POA: Insufficient documentation

## 2019-06-11 DIAGNOSIS — Y999 Unspecified external cause status: Secondary | ICD-10-CM | POA: Diagnosis not present

## 2019-06-11 DIAGNOSIS — Y929 Unspecified place or not applicable: Secondary | ICD-10-CM | POA: Insufficient documentation

## 2019-06-11 DIAGNOSIS — Y939 Activity, unspecified: Secondary | ICD-10-CM | POA: Diagnosis not present

## 2019-06-11 DIAGNOSIS — S161XXA Strain of muscle, fascia and tendon at neck level, initial encounter: Secondary | ICD-10-CM

## 2019-06-11 DIAGNOSIS — Z7982 Long term (current) use of aspirin: Secondary | ICD-10-CM | POA: Diagnosis not present

## 2019-06-11 DIAGNOSIS — R42 Dizziness and giddiness: Secondary | ICD-10-CM | POA: Diagnosis not present

## 2019-06-11 DIAGNOSIS — M542 Cervicalgia: Secondary | ICD-10-CM | POA: Diagnosis present

## 2019-06-11 DIAGNOSIS — I10 Essential (primary) hypertension: Secondary | ICD-10-CM | POA: Insufficient documentation

## 2019-06-11 LAB — BASIC METABOLIC PANEL
Anion gap: 8 (ref 5–15)
BUN: 16 mg/dL (ref 8–23)
CO2: 26 mmol/L (ref 22–32)
Calcium: 9.4 mg/dL (ref 8.9–10.3)
Chloride: 103 mmol/L (ref 98–111)
Creatinine, Ser: 1.28 mg/dL — ABNORMAL HIGH (ref 0.61–1.24)
GFR calc Af Amer: >60 mL/min (ref >60)
GFR calc non Af Amer: 53 mL/min — ABNORMAL LOW (ref >60)
Glucose, Bld: 93 mg/dL (ref 70–99)
Potassium: 4.2 mmol/L (ref 3.5–5.1)
Sodium: 137 mmol/L (ref 135–145)

## 2019-06-11 LAB — CBC WITH DIFFERENTIAL/PLATELET
Abs Immature Granulocytes: 0.01 10*3/uL (ref 0.00–0.07)
Basophils Absolute: 0.1 10*3/uL (ref 0.0–0.1)
Basophils Relative: 1 %
Eosinophils Absolute: 0.2 10*3/uL (ref 0.0–0.5)
Eosinophils Relative: 3 %
HCT: 46.3 % (ref 39.0–52.0)
Hemoglobin: 14.6 g/dL (ref 13.0–17.0)
Immature Granulocytes: 0 %
Lymphocytes Relative: 19 %
Lymphs Abs: 1.3 10*3/uL (ref 0.7–4.0)
MCH: 29.1 pg (ref 26.0–34.0)
MCHC: 31.5 g/dL (ref 30.0–36.0)
MCV: 92.4 fL (ref 80.0–100.0)
Monocytes Absolute: 0.8 10*3/uL (ref 0.1–1.0)
Monocytes Relative: 11 %
Neutro Abs: 4.5 10*3/uL (ref 1.7–7.7)
Neutrophils Relative %: 66 %
Platelets: 287 10*3/uL (ref 150–400)
RBC: 5.01 MIL/uL (ref 4.22–5.81)
RDW: 13.3 % (ref 11.5–15.5)
WBC: 6.8 10*3/uL (ref 4.0–10.5)
nRBC: 0 % (ref 0.0–0.2)

## 2019-06-11 LAB — CBG MONITORING, ED: Glucose-Capillary: 89 mg/dL (ref 70–99)

## 2019-06-11 IMAGING — MR MR HEAD W/O CM
11 of 20 series · 25 of 48 positions shown · non-contrast
Comparison: None.

CLINICAL DATA: Ataxia with stroke suspected

EXAM:
MRI HEAD WITHOUT CONTRAST
TECHNIQUE: Multiplanar, multiecho pulse sequences of the brain and surrounding
structures were obtained without intravenous contrast.

[Series 3: ax (id) · axial · 2.8mm · 0.47mm/px · z∈[-34,+71]mm · 2 of 76 slices shown]
[im 1/76]
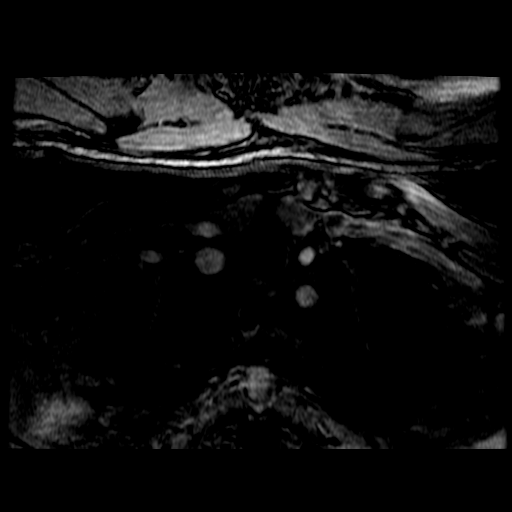
[im 76/76]
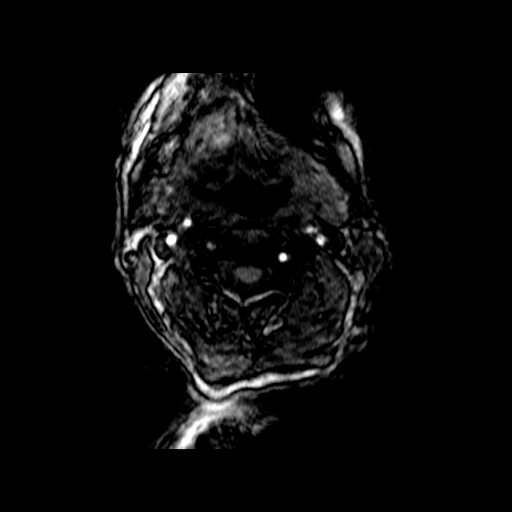

[Series 4: DWI · axial · 3.0mm · 1.09mm/px · z∈[-57,+87]mm · 3 of 98 slices shown (1 of 4)]
[im 1/98]
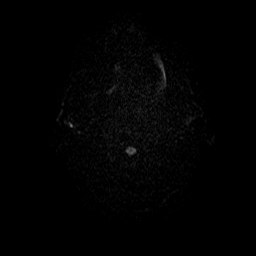
[im 49/98]
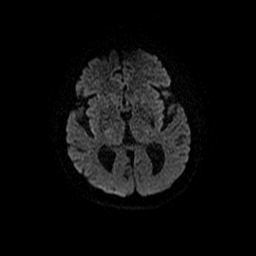
[im 98/98]
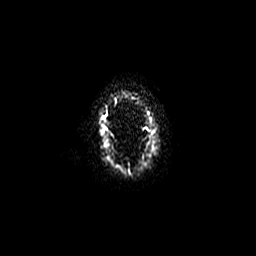

[Series 4: opt sag inhance · sagittal · 1.6mm · 0.66mm/px · 8 of 399 slices shown]
[im 1/399]
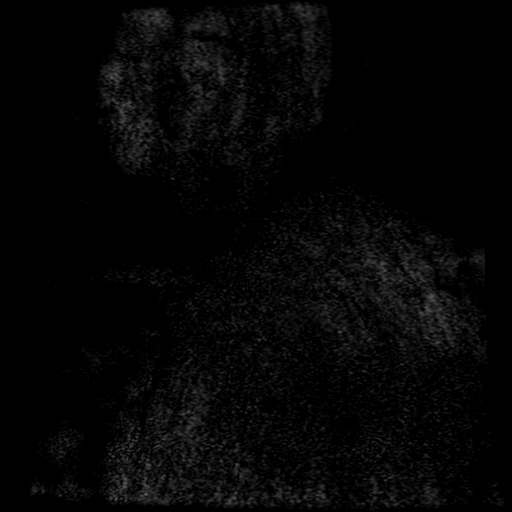
[im 73/399]
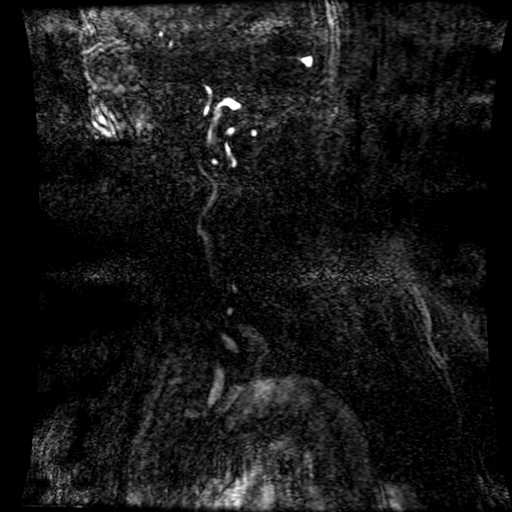
[im 109/399]
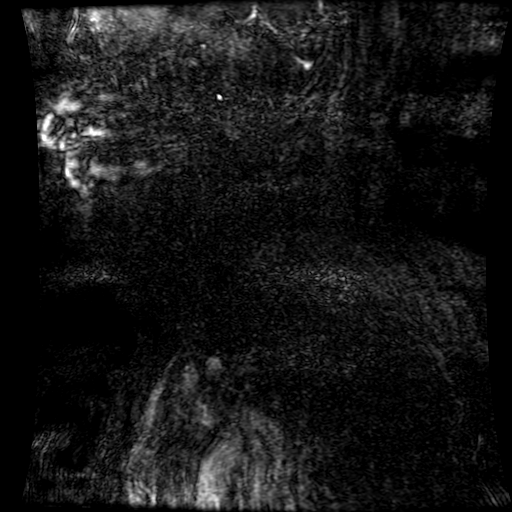
[im 181/399]
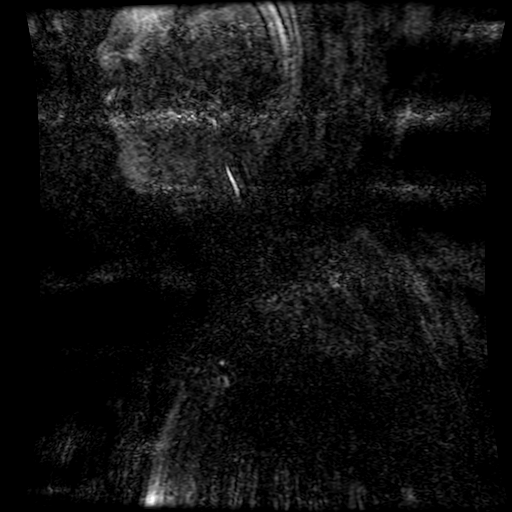
[im 218/399]
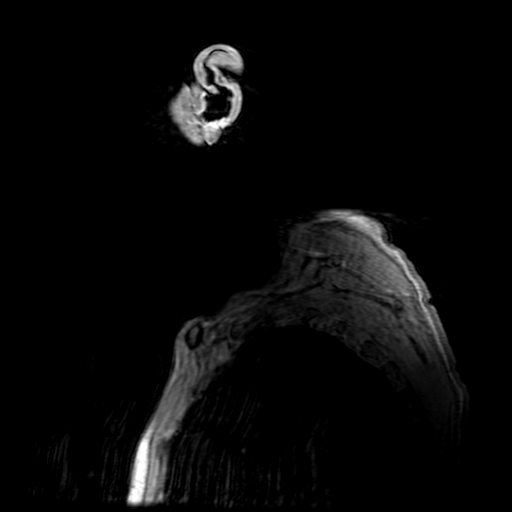
[im 290/399]
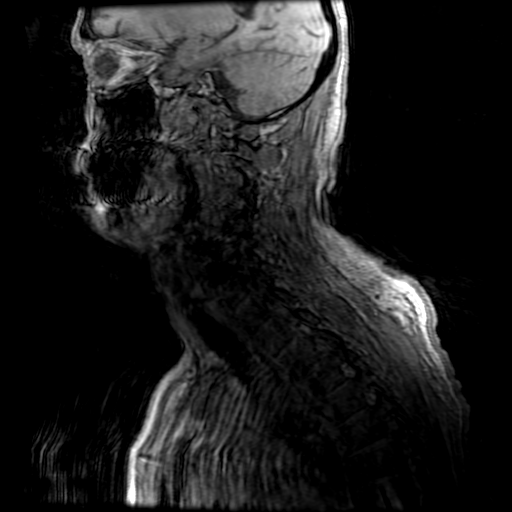
[im 326/399]
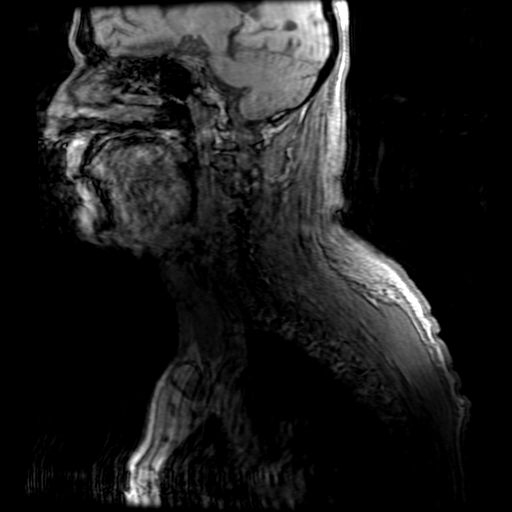
[im 399/399]
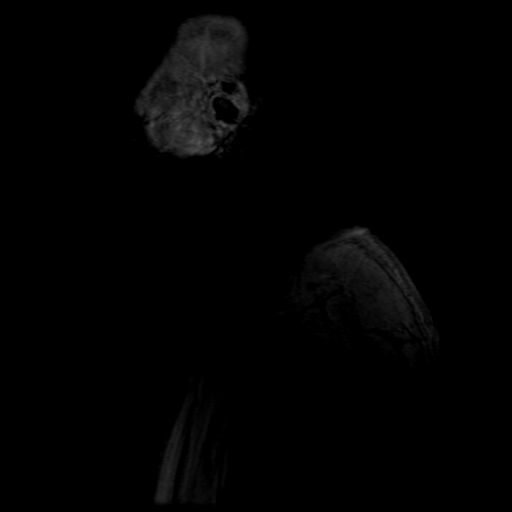

[Series 5: DWI · coronal · 5.0mm · 1.09mm/px · 2 of 70 slices shown (2 of 4)]
[im 1/70]
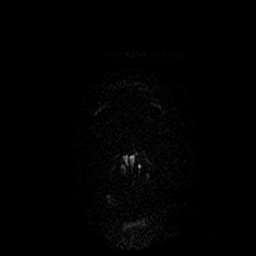
[im 70/70]
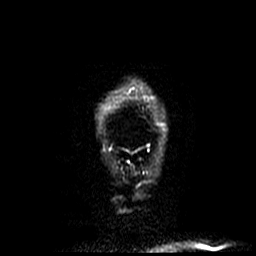

[Series 6: T1 · sagittal · 5.0mm · 0.47mm/px · 1 of 25 slices shown (1 of 2)]
[im 1/25]
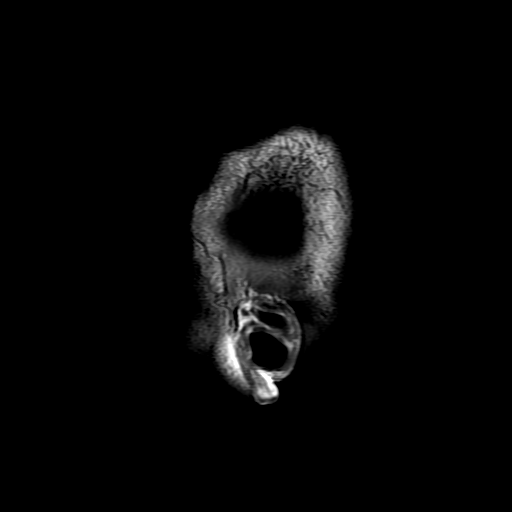

[Series 7: T2 · axial · 5.0mm · 0.43mm/px · 1 of 25 slices shown (1 of 2)]
[im 1/25]
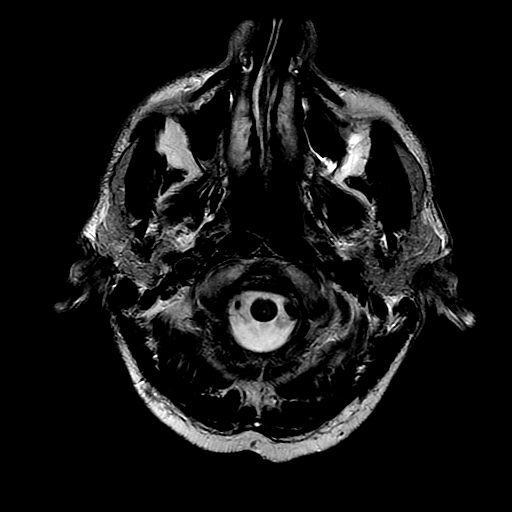

[Series 8: FLAIR · axial · 3.0mm · 0.43mm/px · 1 of 25 slices shown]
[im 1/25]
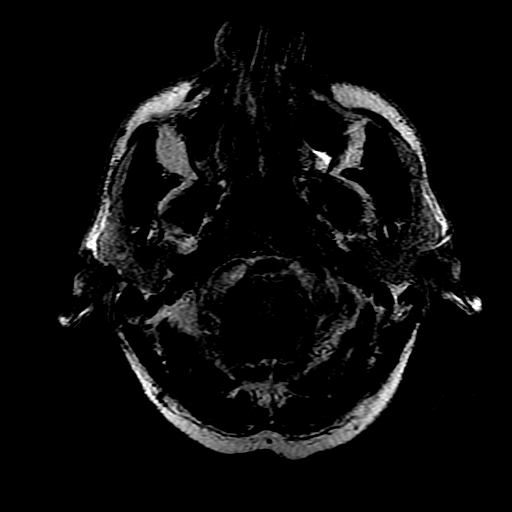

[Series 10: T1 · axial · 3.0mm · 0.47mm/px · z∈[-65,+83]mm · 3 of 100 slices shown (2 of 2)]
[im 1/100]
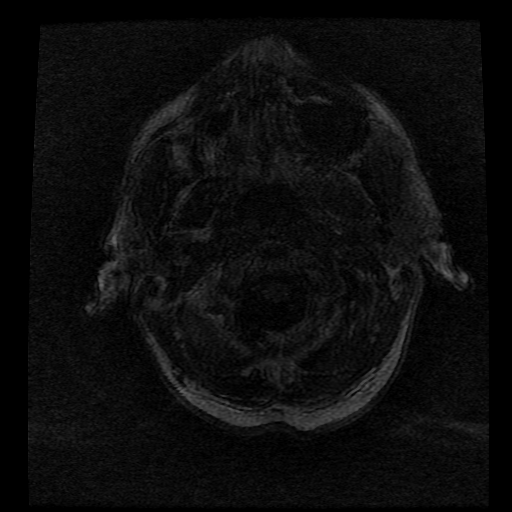
[im 50/100]
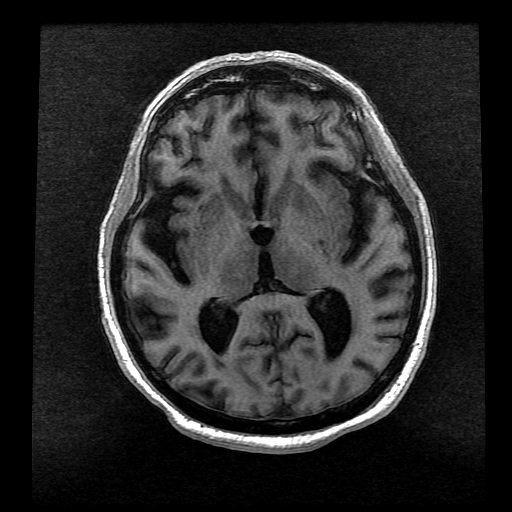
[im 100/100]
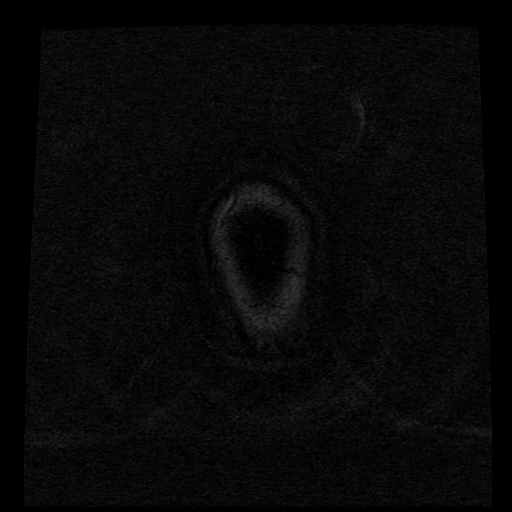

[Series 11: T2 · coronal · 5.0mm · 0.39mm/px · 1 of 28 slices shown (2 of 2)]
[im 1/28]
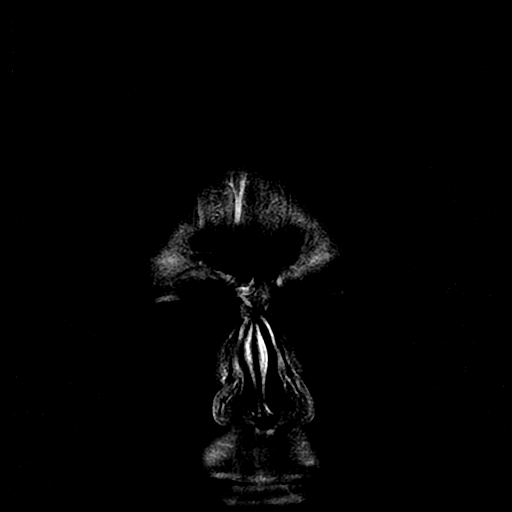

[Series 400: DWI · axial · 3.0mm · 1.09mm/px · z∈[-57,+87]mm · 2 of 49 slices shown (3 of 4)]
[im 1/49]
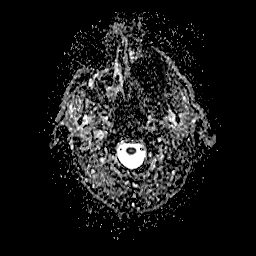
[im 49/49]
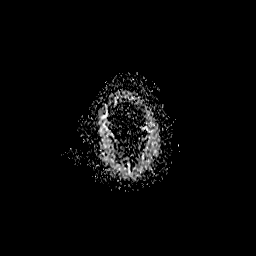

[Series 500: DWI · coronal · 5.0mm · 1.09mm/px · 1 of 35 slices shown (4 of 4)]
[im 1/35]
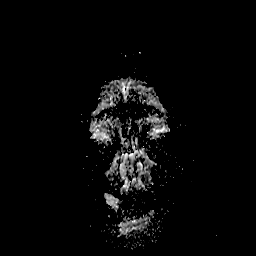

[25 of 48 positions shown; findings below may reference images not displayed]

FINDINGS: Brain: No acute infarction, hemorrhage, hydrocephalus, extra-axial
collection or mass lesion. Remote microhemorrhage at the right pons
and left putamen. Mild chronic small vessel ischemic change in the
periventricular white matter. Age normal brain volume.

Vascular: Normal flow voids

Skull and upper cervical spine: Normal marrow signal

Sinuses/Orbits: No significant finding. Clear mastoid and middle ear
spaces.
IMPRESSION: Aging brain.  No acute finding, including infarct.

## 2019-06-11 MED ORDER — LORAZEPAM 2 MG/ML IJ SOLN
1.0000 mg | Freq: Once | INTRAMUSCULAR | Status: AC
  Administered 2019-06-11: 1 mg via INTRAVENOUS
  Filled 2019-06-11: qty 1

## 2019-06-11 MED ORDER — GADOBUTROL 1 MMOL/ML IV SOLN
6.0000 mL | Freq: Once | INTRAVENOUS | Status: AC | PRN
  Administered 2019-06-11: 6 mL via INTRAVENOUS

## 2019-06-11 NOTE — ED Notes (Signed)
Pt transported back to MRI.  
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
## 2019-06-11 NOTE — ED Notes (Signed)
Patient Alert and oriented to baseline. Stable and ambulatory to baseline. Patient verbalized understanding of the discharge instructions.  Patient belongings were taken by the patient.   

## 2019-06-11 NOTE — Discharge Instructions (Addendum)
The MRI of your brain and cervical spine do not reveal any evidence of stroke.  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  We do not see any evidence of damage to your other blood vessels that are clinically concerning.  Continue to follow-up with vascular surgery as planned for the carotid endarterectomy. Please follow-up with your primary care doctor for further management of the neck pain.  You might need physical therapy to assist with the cervical spasms.  Return to the ER if you have any new and sudden one-sided weakness, numbness, slurred speech, vision changes.
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
## 2019-06-19 ENCOUNTER — Telehealth (HOSPITAL_COMMUNITY): Payer: Self-pay

## 2019-06-19 NOTE — Telephone Encounter (Signed)
Encounter complete. 

## 2019-06-20 ENCOUNTER — Telehealth (HOSPITAL_COMMUNITY): Payer: Self-pay

## 2019-06-20 NOTE — Telephone Encounter (Signed)
Encounter complete. 

## 2019-06-21 ENCOUNTER — Telehealth: Payer: Self-pay

## 2019-06-21 ENCOUNTER — Other Ambulatory Visit: Payer: Self-pay

## 2019-06-21 ENCOUNTER — Ambulatory Visit (HOSPITAL_COMMUNITY)
Admission: RE | Admit: 2019-06-21 | Discharge: 2019-06-21 | Disposition: A | Discharge status: Home / Self Care | Payer: Medicare Other | Source: Ambulatory Visit | Attending: Cardiology | Admitting: Cardiology

## 2019-06-21 DIAGNOSIS — I739 Peripheral vascular disease, unspecified: Secondary | ICD-10-CM | POA: Diagnosis present

## 2019-06-21 DIAGNOSIS — Z01810 Encounter for preprocedural cardiovascular examination: Secondary | ICD-10-CM | POA: Diagnosis not present

## 2019-06-21 LAB — MYOCARDIAL PERFUSION IMAGING
LV dias vol: 90 mL (ref 62–150)
LV sys vol: 40 mL
Peak HR: 90 {beats}/min
Rest HR: 83 {beats}/min
SDS: 6
SRS: 2
SSS: 8
TID: 0.97

## 2019-06-21 MED ORDER — TECHNETIUM TC 99M TETROFOSMIN IV KIT
32.5000 | PACK | Freq: Once | INTRAVENOUS | Status: AC | PRN
  Administered 2019-06-21: 32.5 via INTRAVENOUS
  Filled 2019-06-21: qty 33

## 2019-06-21 MED ORDER — REGADENOSON 0.4 MG/5ML IV SOLN
0.4000 mg | Freq: Once | INTRAVENOUS | Status: AC
Start: 2019-06-21 — End: 2019-06-21
  Administered 2019-06-21: 0.4 mg via INTRAVENOUS

## 2019-06-21 MED ORDER — TECHNETIUM TC 99M TETROFOSMIN IV KIT
10.7000 | PACK | Freq: Once | INTRAVENOUS | Status: AC | PRN
  Administered 2019-06-21: 10.7 via INTRAVENOUS
  Filled 2019-06-21: qty 11

## 2019-06-21 NOTE — Telephone Encounter (Signed)
Spoke to patient he stated he is concerned his B/P has been elevated.174/101,178/98,159/95,149/87,169/91,169/99,149/82,162/97,194/85.WQVLD-44,46,19,01,22,24,11,46,43.Stated he is taking Losartan 100 mg daily.Advised I will send message to [facility] for advice.

## 2019-06-22 NOTE — Telephone Encounter (Signed)
Add amlodipine 5 mg a day, keep a blood pressure log for next 30 days.  Have him come in this see a Pharm.D. for review and medicine titration

## 2019-06-22 NOTE — Telephone Encounter (Signed)
Spoke to patient Dr.Berry's advice given.Stated his PCP prescribed Amlodipine 2.5 mg daily and he only took one time caused dizziness.Stated he will try taking again.He already has pharmacist appointment 6/29 at 8:30 am.Advised to continue to monitor B/P and bring B/P readings to appointment.Also I called Dr.Brabham's office for a sooner appointment.He does not have any openings before your appointment scheduled 08/06/19.They advised to call office daily for a cancellation.

## 2019-06-26 ENCOUNTER — Ambulatory Visit (HOSPITAL_COMMUNITY): Payer: No Typology Code available for payment source

## 2019-06-26 ENCOUNTER — Encounter: Payer: Self-pay | Admitting: Physical Therapy

## 2019-06-26 ENCOUNTER — Ambulatory Visit: Payer: Medicare Other | Attending: Family Medicine | Admitting: Physical Therapy

## 2019-06-26 ENCOUNTER — Other Ambulatory Visit: Payer: Self-pay

## 2019-06-26 DIAGNOSIS — M6281 Muscle weakness (generalized): Secondary | ICD-10-CM

## 2019-06-26 DIAGNOSIS — R278 Other lack of coordination: Secondary | ICD-10-CM | POA: Insufficient documentation

## 2019-06-26 NOTE — Patient Instructions (Signed)
Access Code: [email or web address] URL: [email or web address]: 06/22/2021Prepared by: Loistine Simas BeuhringExercises  Seated Sidebending Arms Overhead - 1 x daily - 7 x weekly - 3 sets - 5 reps - 5 hold  Sit to Stand with Arms Crossed - 1 x daily - 7 x weekly - 3 sets - 10 reps  Seated Long Arc Quad - 1 x daily - 7 x weekly - 3 sets - 10 reps  Seated March with Resistance - 1 x daily - 7 x weekly - 3 sets - 20 reps  Seated Hip Abduction with Resistance - 1 x daily - 7 x weekly - 3 sets - 10 reps  Standing Single Leg Stance with Counter Support - 1 x daily - 7 x weekly - 1 sets - 3 reps - 15 hold

## 2019-06-26 NOTE — Therapy (Signed)
[facility] [address], [address] [place], [place], [address] Phone: [phone]   Fax:  [phone]  Physical Therapy Evaluation  Patient Details  Name: Jonathan Savage MRN: [id number] Date of Birth: [birth year] Referring Provider (PT): Farris Has, MD   Encounter Date: 06/26/2019   PT End of Session - 06/26/19 0957    Visit Number 1    Date for PT Re-Evaluation 08/21/19    Authorization Type Medicare    Progress Note Due on Visit 10    PT Start Time 1000    PT Stop Time 1045    PT Time Calculation (min) 45 min    Activity Tolerance Patient tolerated treatment well    Behavior During Therapy [facility] for tasks assessed/performed           History reviewed. No pertinent past medical history.  History reviewed. No pertinent surgical history.  There were no vitals filed for this visit.    Subjective Assessment - 06/26/19 0958    Subjective Pt had a decrease in activity during COVID and following his wife's death and has been referred to PT for LE weakness.  Pt reports he lost his wife about 2 years ago.  Pt is used to being very active and worked as a Music therapist until age 51.    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 Yes    Pain Score 4     Pain Location Knee    Pain Orientation Left;Medial    Pain Type Chronic pain    Pain Onset More than a month ago    Pain Frequency Intermittent    Aggravating Factors  increased use, walking              [facility] PT Assessment - 06/26/19 0001      Assessment   Medical Diagnosis R29.898 (ICD-10-CM) - Other symptoms and signs involving the musculoskeletal system    Referring Provider (PT) Farris Has, MD    Onset Date/Surgical Date --   2 years ago after wife died, declined activity   Prior Therapy no      Precautions    Precautions None      Restrictions   Weight Bearing Restrictions No      Balance Screen   Has the patient fallen in the past 6 months No      Home Environment   Living Environment Private residence    Living Arrangements Children    Type of Home House    Home Access Level entry    Home Layout Two level    Alternate Level Stairs-Number of Steps 12    Alternate Level Stairs-Rails Right    Home Equipment None      Prior Function   Level of Independence Independent      Cognition   Overall Cognitive Status Within Functional Limits for tasks assessed      Observation/Other Assessments   Observations Lt medial knee increased bone formation, arthritis present      Functional Tests   Functional tests Squat;Single leg stance;Other      Squat   Comments slow but reaches floor and stands with hands on thighs      Single Leg Stance   Comments unable to balance bil > 3"      Other:  Other/ Comments stand to sit difficulty with eccentric control end range      Posture/Postural Control   Posture/Postural Control Postural limitations    Postural Limitations Decreased lumbar lordosis;Increased thoracic kyphosis    Posture Comments genu varum bil      ROM / Strength   AROM / PROM / Strength AROM;Strength      AROM   Overall AROM Comments trunk flexion hands to floor, trunk bil SB, ext, rot bil extlimited 75%    AROM Assessment Site Lumbar    Lumbar Flexion 55   hands to floor   Lumbar Extension 3    Lumbar - Right Side Bend 5    Lumbar - Left Side Bend 5    Lumbar - Right Rotation 3    Lumbar - Left Rotation 3      Strength   Overall Strength Comments Lt LE 4-/5 (Hx of stroke affecting Lt side), Rt LE 4/5 throughout      Palpation   Palpation comment Lt knee medial joint line tender to palpation      Transfers   Five time sit to stand comments  11 sec, hands on thighs for balance      Ambulation/Gait   Stairs Yes    Stairs Assistance 6: Modified independent  (Device/Increase time)   alternating pattern up/down   Stair Management Technique One rail Left      Standardized Balance Assessment   Standardized Balance Assessment Timed Up and Go Test      Timed Up and Go Test   TUG Normal TUG    Normal TUG (seconds) 11    TUG Comments multi-step turn vs pivot turn                      Objective measurements completed on examination: See above findings.               PT Education - 06/26/19 1040    Education Details Access Code: [email or web address]    Person(s) Educated Patient    Methods Explanation;Demonstration;Handout    Comprehension Verbalized understanding;Returned demonstration            PT Short Term Goals - 06/26/19 1339      PT SHORT TERM GOAL #1   Title Pt will be ind with initial HEP.    Status New    Target Date 07/24/19      PT SHORT TERM GOAL #2   Title Pt will participate in a BERG Balance Assessment at first follow up visit to tease out any dynamic balance deficits, with LTG set if so.    Time 4    Period Weeks    Status New    Target Date 07/24/19             PT Long Term Goals - 06/26/19 1340      PT LONG TERM GOAL #1   Title Pt will be ind with advanced HEP for LE strength and spine ROM.    Time 8    Period Weeks    Status New    Target Date 08/21/19      PT LONG TERM GOAL #2   Title Pt will achieve improved trunk A/ROM of at least 10 deg each of bil lumbar SB, ext and bil rotation for improved mobility for functional tasks.    Time 8    Period Weeks    Status New    Target Date 08/21/19  PT LONG TERM GOAL #3   Title Pt will achieve at least 4+/5 throughout bil LEs for improved ability to rise from deep squat and do stairs with more confidence.    Time 8    Period Weeks    Status New    Target Date 08/21/19      PT LONG TERM GOAL #4   Title Pt will improve 5x sit to stand to 9 sec or less for improved strength and endurance for transfers    Time 8    Period Weeks     Status New    Target Date 08/21/19      PT LONG TERM GOAL #5   Title Pt will perform TUG with use of pivot turn to reduce time to </= 9 sec.    Time 8    Period Weeks    Status New    Target Date 08/21/19      Additional Long Term Goals   Additional Long Term Goals Yes      PT LONG TERM GOAL #6   Title Pt will report improved perception of strength, endurance and steadiness for all daily tasks by at least 70%    Time 8    Period Weeks    Status New    Target Date 08/21/19                  Plan - 06/26/19 1051    Clinical Impression Statement Pt is a pleasant 79yo male who reports a decline in activity 2 years ago when his wife passed away.  COVID further limited his activity.  He currently lives with his adult daughter.  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  He has a history of a spinal cord stroke which contributes to Lt LE weakness.  He is referred for LE strengthening.  He presents with limited trunk mobility, LE weakness, and unsteadiness on feet.  5x sit to stand and TUG were both 11 sec.  He is able to perform alt pattern on stairs using single rail.  He uses multi-step turn vs pivot turn during TUG test.  He is unable to balance in SLS on Rt/Lt.  Eccentric control weakness in quads is present with squatting and stand to sit.  With fatigue he uses Rt LE> Lt LE for sit to stands.  He needs cueing for knee control in squat to avoid valgus angle.  He has good LE flexibility and hip ROM.  Trunk flexion is hands to floor but all other motions are very stiff.  Pt will benefit from skilled PT for strength, trunk mobility, functional strength training for stairs, transfers and dynamic gait, and balance.    Personal Factors and Comorbidities Age;Fitness;Comorbidity 1;Social Background    Comorbidities Lt medial knee pain, currently monitoring BP/HR and working with MD on meds which may be contributing to dizziness/imbalance with dynamic gait, Hx of spinal  stroke with Lt LE weakness, lost his wife 2 years ago    Examination-Activity Limitations Locomotion Level;Transfers;Bend;Squat;Stairs    Examination-Participation Restrictions Cleaning;Community Activity;Shop;Laundry;Yard Work    Stability/Clinical Decision Making Stable/Uncomplicated    Optometrist Low    Rehab Potential Excellent    PT Frequency 2x / week    PT Duration 8 weeks   taper to 1x/week when ind with HEP   PT Treatment/Interventions ADLs/Self Care Home Management;Gait training;Stair training;Functional mobility training;Therapeutic activities;Therapeutic exercise;Neuromuscular re-education;Balance training;Patient/family education;Manual techniques;Passive range of motion;Spinal Manipulations;Joint Manipulations  PT Next Visit Plan do BERG and make goal, f/u on initial HEP, dynamic gait/balance, progress standing medbridge LE strength as tol    PT Home Exercise Plan Access Code: [email or web address]    Consulted and Agree with Plan of Care Patient           Patient will benefit from skilled therapeutic intervention in order to improve the following deficits and impairments:  Decreased range of motion, Dizziness, Decreased endurance, Decreased activity tolerance, Pain, Decreased balance, Postural dysfunction, Decreased strength, Decreased mobility, Improper body mechanics  Visit Diagnosis: Muscle weakness (generalized) - Plan: PT plan of care cert/re-cert  Other lack of coordination - Plan: PT plan of care cert/re-cert     Problem List Patient Active Problem List   Diagnosis Date Noted  . 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 Essential hypertension 06/05/2019  . Hyperlipidemia 06/05/2019  . Carotid bruit 06/05/2019  . Syncope and collapse 06/05/2019    Jonathan Savage, PT 06/26/19 1:57 PM   [facility] [address] [place], [place], [address] Phone: [phone]   Fax:   [phone]  Name: Jonathan Savage MRN: [id number] Date of Birth: 11-23-1940

## 2019-07-03 ENCOUNTER — Other Ambulatory Visit: Payer: Self-pay

## 2019-07-03 ENCOUNTER — Ambulatory Visit (INDEPENDENT_AMBULATORY_CARE_PROVIDER_SITE_OTHER): Payer: Medicare Other | Admitting: Pharmacist

## 2019-07-03 ENCOUNTER — Encounter: Payer: Self-pay | Admitting: Pharmacist

## 2019-07-03 VITALS — BP 156/76 | HR 68

## 2019-07-03 DIAGNOSIS — I6522 Occlusion and stenosis of left carotid artery: Secondary | ICD-10-CM | POA: Diagnosis not present

## 2019-07-03 DIAGNOSIS — I10 Essential (primary) hypertension: Secondary | ICD-10-CM
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
## 2019-07-03 NOTE — Patient Instructions (Addendum)
It was nice meeting you today.  Your blood pressure goal is less than 130/80  Continue your losartan 100 mg once daily and increase your amlodipine to 5 mg daily.    Eat a heart healthy diet.  Increase vegetables, fruits and whole grains   Please give [place] a call with any questions  Laural Golden, PharmD, Patsy [place], CDCES [facility] [address] [place], [place] [address] Phone: [phone]; Fax: [phone] 07/03/2019 9:04 AM

## 2019-07-05 ENCOUNTER — Telehealth: Payer: Self-pay | Admitting: Physical Therapy

## 2019-07-05 ENCOUNTER — Ambulatory Visit: Payer: Medicare Other | Attending: Family Medicine | Admitting: Physical Therapy

## 2019-07-05 DIAGNOSIS — R278 Other lack of coordination: Secondary | ICD-10-CM | POA: Insufficient documentation

## 2019-07-05 DIAGNOSIS — M6281 Muscle weakness (generalized): Secondary | ICD-10-CM | POA: Insufficient documentation

## 2019-07-05 NOTE — Telephone Encounter (Signed)
PT called Pt about missed appointment today at 2:00pm.  Spoke to Pt who was waiting for appointment outside in his car thinking he was too early.  He hadn't changed the time on his car console from daylight savings and thought it was 1:25 vs 2:25.  Confirmed next appointment for 07/18/19 and verified he is on the cancellation list to try to get appointment sooner.  Serin Thornell, PT 07/05/19 2:31 PM

## 2019-07-12 ENCOUNTER — Encounter: Payer: Self-pay | Admitting: Physical Therapy

## 2019-07-12 ENCOUNTER — Ambulatory Visit: Payer: Medicare Other | Admitting: Physical Therapy

## 2019-07-12 ENCOUNTER — Other Ambulatory Visit: Payer: Self-pay

## 2019-07-12 DIAGNOSIS — M6281 Muscle weakness (generalized): Secondary | ICD-10-CM | POA: Diagnosis present

## 2019-07-12 DIAGNOSIS — R278 Other lack of coordination: Secondary | ICD-10-CM

## 2019-07-12 NOTE — Patient Instructions (Signed)
Access Code: [email or web address] URL: [email or web address] Date: 07/12/2019 Prepared by: Lavinia Sharps  Exercises Seated Sidebending Arms Overhead - 1 x daily - 7 x weekly - 3 sets - 5 reps - 5 hold Sit to Stand with Arms Crossed - 1 x daily - 7 x weekly - 3 sets - 10 reps Seated Long Arc Quad - 1 x daily - 7 x weekly - 3 sets - 10 reps Seated March with Resistance - 1 x daily - 7 x weekly - 3 sets - 20 reps Seated Hip Abduction with Resistance - 1 x daily - 7 x weekly - 3 sets - 10 reps Standing Single Leg Stance with Counter Support - 1 x daily - 7 x weekly - 1 sets - 3 reps - 15 hold Standing Diagonal Shoulder Extension with Anchored Resistance - 1 x daily - 7 x weekly - 2 sets - 10 reps Standing Bilateral Low Shoulder Row with Anchored Resistance - 1 x daily - 7 x weekly - 2 sets - 10 reps Shoulder Extension with Resistance - Palms Forward - 1 x daily - 7 x weekly - 2 sets - 10 reps Standing Heel Raise with Support - 1 x daily - 7 x weekly - 2 sets - 10 reps

## 2019-07-12 NOTE — Therapy (Signed)
[facility] [address] [place], [place], [address] Phone: [phone]   Fax:  [phone]  Physical Therapy Treatment  Patient Details  Name: Jonathan Savage MRN: [id number] Date of Birth: Apr 27, 1940 Referring Provider (PT): Farris Has, MD   Encounter Date: 07/12/2019   PT End of Session - 07/12/19 1125    Visit Number 2    Date for PT Re-Evaluation 08/21/19    Authorization Type Medicare    Progress Note Due on Visit 10    PT Start Time 0930    PT Stop Time 1014    PT Time Calculation (min) 44 min    Activity Tolerance Patient tolerated treatment well           History reviewed. No pertinent past medical history.  History reviewed. No pertinent surgical history.  There were no vitals filed for this visit.   Subjective Assessment - 07/12/19 0932    Subjective I have a messed up knee.  It hurts a little bit this morning.    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 Yes    Pain Score 1     Pain Location Knee                             [facility] Adult PT Treatment/Exercise - 07/12/19 0001      Berg Balance Test   Sit to Stand Able to stand without using hands and stabilize independently    Standing Unsupported Able to stand safely 2 minutes    Sitting with Back Unsupported but Feet Supported on Floor or Stool Able to sit safely and securely 2 minutes    Stand to Sit Sits safely with minimal use of hands    Transfers Able to transfer safely, minor use of hands    Standing Unsupported with Eyes Closed Able to stand 10 seconds with supervision    Standing Ubsupported with Feet Together Able to place feet together independently and stand 1 minute safely    From Standing, Reach Forward with Outstretched Arm Can reach confidently >25 cm (10")    From Standing Position, Pick up  Object from Floor Able to pick up shoe safely and easily    From Standing Position, Turn to Look Behind Over each Shoulder Looks behind from both sides and weight shifts well    Turn 360 Degrees Able to turn 360 degrees safely one side only in 4 seconds or less    Standing Unsupported, Alternately Place Feet on Step/Stool Able to stand independently and safely and complete 8 steps in 20 seconds    Standing Unsupported, One Foot in Front Able to place foot tandem independently and hold 30 seconds    Standing on One Leg Able to lift leg independently and hold > 10 seconds    Total Score 54      Neuro Re-ed    Neuro Re-ed Details  narrow base of support with dynamic movements       Knee/Hip Exercises: Standing   Heel Raises Both;15 reps      Knee/Hip Exercises: Seated   Other Seated Knee/Hip Exercises review of initial HEP     Sit to Sand 10 reps   modified to full stand to sit rather than hover due to pain     Shoulder  Exercises: Standing   Extension Strengthening;Both;20 reps;Theraband    Theraband Level (Shoulder Extension) Level 3 (Green)    Extension Limitations discussed stagger foot position for added balance challenge    Row Strengthening;15 reps;Theraband    Theraband Level (Shoulder Row) Level 3 (Green)    Row Limitations staggered stand for balance challenge    Diagonals Strengthening;Right;Left;12 reps    Theraband Level (Shoulder Diagonals) Level 2 (Red)    Diagonals Limitations Kickstand position while single arm diagonal                   PT Education - 07/12/19 1124    Education Details green band rows, extensions, UE diagonals; heel raises    Person(s) Educated Patient    Methods Explanation;Demonstration;Handout    Comprehension Returned demonstration;Verbalized understanding            PT Short Term Goals - 07/12/19 1131      PT SHORT TERM GOAL #1   Title Pt will be ind with initial HEP.    Status On-going    Target Date 07/24/19      PT SHORT  TERM GOAL #2   Title Pt will participate in a BERG Balance Assessment at first follow up visit to tease out any dynamic balance deficits, with LTG set if so.    Baseline completed on  7/8 54/56    Status Achieved             PT Long Term Goals - 06/26/19 1340      PT LONG TERM GOAL #1   Title Pt will be ind with advanced HEP for LE strength and spine ROM.    Time 8    Period Weeks    Status New    Target Date 08/21/19      PT LONG TERM GOAL #2   Title Pt will achieve improved trunk A/ROM of at least 10 deg each of bil lumbar SB, ext and bil rotation for improved mobility for functional tasks.    Time 8    Period Weeks    Status New    Target Date 08/21/19      PT LONG TERM GOAL #3   Title Pt will achieve at least 4+/5 throughout bil LEs for improved ability to rise from deep squat and do stairs with more confidence.    Time 8    Period Weeks    Status New    Target Date 08/21/19      PT LONG TERM GOAL #4   Title Pt will improve 5x sit to stand to 9 sec or less for improved strength and endurance for transfers    Time 8    Period Weeks    Status New    Target Date 08/21/19      PT LONG TERM GOAL #5   Title Pt will perform TUG with use of pivot turn to reduce time to </= 9 sec.    Time 8    Period Weeks    Status New    Target Date 08/21/19      Additional Long Term Goals   Additional Long Term Goals Yes      PT LONG TERM GOAL #6   Title Pt will report improved perception of strength, endurance and steadiness for all daily tasks by at least 70%    Time 8    Period Weeks    Status New    Target Date 08/21/19  Plan - 07/12/19 0934    Clinical Impression Statement The patient scores 54/56 indicating a lower risk of falls.  He reports he feels his balance is "bad" when his eyes are closed in the shower, when it's dark and when standing on a soft surface.  He demonstrates good carryover with his initial HEP but complains of pain in his left  knee with hovering above the sit with sit to stand exercise.  Discussed a modification to full sitting which reduced his pain and improved patellofemoral alignment.  He is eager to do some "arm strengthening" and incorporated dynamic balance challenges with UE movements.  Therapist providing close supervision for safety and monitoring response.    Comorbidities Lt medial knee pain, currently monitoring BP/HR and working with MD on meds which may be contributing to dizziness/imbalance with dynamic gait, Hx of spinal stroke with Lt LE weakness, lost his wife 2 years ago    Rehab Potential Excellent    PT Frequency 2x / week    PT Duration 8 weeks    PT Treatment/Interventions ADLs/Self Care Home Management;Gait training;Stair training;Functional mobility training;Therapeutic activities;Therapeutic exercise;Neuromuscular re-education;Balance training;Patient/family education;Manual techniques;Passive range of motion;Spinal Manipulations;Joint Manipulations    PT Next Visit Plan dynamic gait/balance with focus on eyes closed and/or standing on soft surfaces and loss of balance recovery;   progress standing medbridge LE strength as tol with mindfulness of left knee pain    PT Home Exercise Plan Access Code: [email or web address]           Patient will benefit from skilled therapeutic intervention in order to improve the following deficits and impairments:  Decreased range of motion, Dizziness, Decreased endurance, Decreased activity tolerance, Pain, Decreased balance, Postural dysfunction, Decreased strength, Decreased mobility, Improper body mechanics  Visit Diagnosis: Muscle weakness (generalized)  Other lack of coordination     Problem List Patient Active Problem List   Diagnosis Date Noted  . 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 Essential hypertension 06/05/2019  . Hyperlipidemia 06/05/2019  . Carotid bruit 06/05/2019  . Syncope and collapse 06/05/2019   Lavinia Sharps,  PT 07/12/19 11:35 AM Phone: [phone] Fax: [phone] Vivien Presto 07/12/2019, 11:34 AM  [facility] [address] [place], [place], [address] Phone: [phone]   Fax:  [phone]  Name: Jonathan Savage MRN: [id number] Date of Birth: 1941/01/01

## 2019-07-18 ENCOUNTER — Other Ambulatory Visit: Payer: Self-pay

## 2019-07-18 ENCOUNTER — Encounter: Payer: Self-pay | Admitting: Physical Therapy

## 2019-07-18 ENCOUNTER — Ambulatory Visit: Payer: Medicare Other | Admitting: Physical Therapy

## 2019-07-18 DIAGNOSIS — R278 Other lack of coordination: Secondary | ICD-10-CM | POA: Diagnosis not present

## 2019-07-18 DIAGNOSIS — M6281 Muscle weakness (generalized): Secondary | ICD-10-CM
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
## 2019-07-19 ENCOUNTER — Other Ambulatory Visit: Payer: Self-pay

## 2019-07-19 ENCOUNTER — Ambulatory Visit (INDEPENDENT_AMBULATORY_CARE_PROVIDER_SITE_OTHER): Payer: Medicare Other | Admitting: Pharmacist

## 2019-07-19 ENCOUNTER — Encounter: Payer: Self-pay | Admitting: Pharmacist

## 2019-07-19 VITALS — BP 144/80 | HR 68

## 2019-07-19 DIAGNOSIS — I10 Essential (primary) hypertension: Secondary | ICD-10-CM

## 2019-07-19 DIAGNOSIS — I6522 Occlusion and stenosis of left carotid artery: Secondary | ICD-10-CM

## 2019-07-19 MED ORDER — AMLODIPINE BESYLATE 2.5 MG PO TABS: 7.5000 mg | ORAL_TABLET | Freq: Every day | ORAL | 1 refills | Status: DC

## 2019-07-19 NOTE — Patient Instructions (Addendum)
It was good seeing you again!  Your blood pressure goal is <130/80  Continue your losartan 100 mg once daily  We are going to increase your amlodipine to 7.5mg  (3 tablets) once a day  Please call us with any questions  Laural Golden, PharmD, Patsy [place], CDCES [facility] [address] [place], [place] [address] Phone: [phone]; Fax: [phone] 07/19/2019 9:15 AM
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
## 2019-07-23 ENCOUNTER — Encounter: Payer: Self-pay | Admitting: Physical Therapy

## 2019-07-23 ENCOUNTER — Ambulatory Visit: Payer: Medicare Other | Admitting: Physical Therapy

## 2019-07-23 ENCOUNTER — Other Ambulatory Visit: Payer: Self-pay

## 2019-07-23 DIAGNOSIS — R278 Other lack of coordination: Secondary | ICD-10-CM | POA: Diagnosis not present

## 2019-07-23 DIAGNOSIS — M6281 Muscle weakness (generalized): Secondary | ICD-10-CM

## 2019-07-23 NOTE — Therapy (Signed)
[facility] [address] [place], [place], [address] Phone: [phone]   Fax:  [phone]  Physical Therapy Treatment  Patient Details  Name: Jonathan Savage MRN: [id number] Date of Birth: 22-Feb-1940 Referring Provider (PT): Farris Has, MD   Encounter Date: 07/23/2019   PT End of Session - 07/23/19 0810    Visit Number 4    Date for PT Re-Evaluation 08/21/19    Authorization Type Medicare    Progress Note Due on Visit 10    PT Start Time 0755    PT Stop Time 0842    PT Time Calculation (min) 47 min    Activity Tolerance Patient tolerated treatment well    Behavior During Therapy [facility] for tasks assessed/performed           History reviewed. No pertinent past medical history.  History reviewed. No pertinent surgical history.  There were no vitals filed for this visit.   Subjective Assessment - 07/23/19 0811    Subjective i joined the [facility] and will start taking classes this week. 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 No/denies    Multiple Pain Sites No                             [facility] Adult PT Treatment/Exercise - 07/23/19 0001      Neuro Re-ed    Neuro Re-ed Details  3 different stances with eyes closed 3x 10 sec bil, no UE: Black pad marching and SLS with light UE support/touch 3x 10-20 sec each   grenn band quick ossilations in tandem stance 1 min 3x     Knee/Hip Exercises: Aerobic   Nustep L3 x 10 min    PTA present to monitor pt's response.      Shoulder Exercises: Stretch   Other Shoulder Stretches Seated cane overhead stretch with lateral trunk bending  and trunk rotation 10x bil    VC to bend at spine VS moving just arms                   PT Short Term Goals - 07/23/19 0827      PT SHORT TERM GOAL  #1   Title Pt will be ind with initial HEP.    Status Achieved    Target Date 07/24/19             PT Long Term Goals - 07/18/19 0905      PT LONG TERM GOAL #5   Title Pt will perform TUG with use of pivot turn to reduce time to </= 9 sec.    Time 8    Period Weeks    Status Achieved   8 sec                Plan - 07/23/19 0810    Clinical Impression Statement Pt independent in initial HEP, meeting goal. Pt demonstrates much improved steadiness with eyes closed balance exercises today, requiring no upper extremity support. Pt did require upper extremity support for single leg stance on teh balck pad. Pt worked on trunk AROM today.    Personal Factors and Comorbidities Age;Fitness;Comorbidity 1;Social Background    Comorbidities Lt medial knee pain, currently monitoring BP/HR and working  with MD on meds which may be contributing to dizziness/imbalance with dynamic gait, Hx of spinal stroke with Lt LE weakness, lost his wife 2 years ago    Examination-Activity Limitations Locomotion Level;Transfers;Bend;Squat;Stairs    Examination-Participation Restrictions Cleaning;Community Activity;Shop;Laundry;Yard Work    Stability/Clinical Decision Making Stable/Uncomplicated    Rehab Potential Excellent    PT Frequency 2x / week    PT Duration 8 weeks    PT Treatment/Interventions ADLs/Self Care Home Management;Gait training;Stair training;Functional mobility training;Therapeutic activities;Therapeutic exercise;Neuromuscular re-education;Balance training;Patient/family education;Manual techniques;Passive range of motion;Spinal Manipulations;Joint Manipulations    PT Next Visit Plan BERG next session and give trunk stretches for HEP: Internet issues today so not given    PT Home Exercise Plan Access Code: [email or web address]    Consulted and Agree with Plan of Care Patient           Patient will benefit from skilled therapeutic intervention in order to improve the following deficits and  impairments:  Decreased range of motion, Dizziness, Decreased endurance, Decreased activity tolerance, Pain, Decreased balance, Postural dysfunction, Decreased strength, Decreased mobility, Improper body mechanics  Visit Diagnosis: Other lack of coordination  Muscle weakness (generalized)     Problem List Patient Active Problem List   Diagnosis Date Noted  . 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 Essential hypertension 06/05/2019  . Hyperlipidemia 06/05/2019  . Carotid bruit 06/05/2019  . Syncope and collapse 06/05/2019    Kenedy Haisley, PTA 07/23/2019, 8:44 AM  [facility] [address], [address] [place], [place], [address] Phone: [phone]   Fax:  [phone]  Name: Izayah Miner MRN: [id number] Date of Birth: 02-Jul-1940

## 2019-07-25 ENCOUNTER — Other Ambulatory Visit: Payer: Self-pay

## 2019-07-25 DIAGNOSIS — I6529 Occlusion and stenosis of unspecified carotid artery: Secondary | ICD-10-CM

## 2019-07-27 ENCOUNTER — Encounter: Payer: Self-pay | Admitting: Physical Therapy

## 2019-07-27 ENCOUNTER — Ambulatory Visit: Payer: Medicare Other | Admitting: Physical Therapy

## 2019-07-27 ENCOUNTER — Other Ambulatory Visit: Payer: Self-pay

## 2019-07-27 DIAGNOSIS — R278 Other lack of coordination: Secondary | ICD-10-CM

## 2019-07-27 DIAGNOSIS — M6281 Muscle weakness (generalized): Secondary | ICD-10-CM

## 2019-07-27 NOTE — Therapy (Signed)
[facility] [address] [place], [place], [address] Phone: [phone]   Fax:  [phone]  Physical Therapy Treatment  Patient Details  Name: Jonathan Savage MRN: [id number] Date of Birth: April 10, 1940 Referring Provider (PT): Farris Has, MD   Encounter Date: 07/27/2019   PT End of Session - 07/27/19 0940    Visit Number 5    Date for PT Re-Evaluation 08/21/19    Authorization Type Medicare    Progress Note Due on Visit 10    PT Start Time 0847    PT Stop Time 0930    PT Time Calculation (min) 43 min    Activity Tolerance Patient tolerated treatment well           History reviewed. No pertinent past medical history.  History reviewed. No pertinent surgical history.  There were no vitals filed for this visit.   Subjective Assessment - 07/27/19 0850    Subjective Had a good birthday.  Walked a lot at a car show about 5 miles. My knee hurts some.   My BP was a bit high this morning 148/80.  I joined an ex class at [employer].    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 Yes    Pain Score 1     Pain Location Knee    Pain Orientation Left              [facility] PT Assessment - 07/27/19 0001      Timed Up and Go Test   TUG Normal TUG    Normal TUG (seconds) 7                         [facility] Adult PT Treatment/Exercise - 07/27/19 0001      Therapeutic Activites    Therapeutic Activities Other Therapeutic Activities    Other Therapeutic Activities sit to stand, walking fast; lifting, climbing steps      Neuro Re-ed    Neuro Re-ed Details  standing on foam staggered stand and kickstand positions with green band pulls 4x 10; eyes closed with 4 way weight shift; SLS with attempted head turns but loses balance       Knee/Hip Exercises: Stretches   Other Knee/Hip Stretches doorway  psoas stretch with and without UE movements right/left       Knee/Hip Exercises: Aerobic   Nustep L3 8 min while discussing status       Knee/Hip Exercises: Standing   Forward Step Up Right;Left;10 reps;Hand Hold: 0;Step Height: 6"    Other Standing Knee Exercises 8# weight carry 4x 20 feet     Other Standing Knee Exercises 8# modified dead lift to knee level 2x 5       Knee/Hip Exercises: Seated   Sit to Sand 2 sets;5 reps   holding 8# weight      Shoulder Exercises: Standing   Other Standing Exercises counter push ups 15x                     PT Short Term Goals - 07/27/19 0856      PT SHORT TERM GOAL #1   Title Pt will be ind with initial HEP.    Status Achieved      PT SHORT TERM GOAL #2   Title Pt will participate  in a BERG Balance Assessment at first follow up visit to tease out any dynamic balance deficits, with LTG set if so.    Status Achieved             PT Long Term Goals - 07/18/19 0905      PT LONG TERM GOAL #5   Title Pt will perform TUG with use of pivot turn to reduce time to </= 9 sec.    Time 8    Period Weeks    Status Achieved   8 sec                Plan - 07/27/19 0856    Clinical Impression Statement The patient is able to perform moderate challenges to standing balance with soft surface and narrow base of support as well as weight shifting with eyes closed.  CGA for safety but no major loss of balance.  He has difficulty with single leg standing 5 sec max.  He is able to do loaded strengthening with reports of mild knee pain but not overly limiting.  Therapist closely monitoring response with all interventions.    Comorbidities Lt medial knee pain, currently monitoring BP/HR and working with MD on meds which may be contributing to dizziness/imbalance with dynamic gait, Hx of spinal stroke with Lt LE weakness, lost his wife 2 years ago    Examination-Activity Limitations Locomotion Level;Transfers;Bend;Squat;Stairs     Examination-Participation Restrictions Cleaning;Community Activity;Shop;Laundry;Yard Work    Designer, fashion/clothing    PT Frequency 2x / week    PT Duration 8 weeks    PT Treatment/Interventions ADLs/Self Care Home Management;Gait training;Stair training;Functional mobility training;Therapeutic activities;Therapeutic exercise;Neuromuscular re-education;Balance training;Patient/family education;Manual techniques;Passive range of motion;Spinal Manipulations;Joint Manipulations    PT Next Visit Plan give trunk stretches for HEP;  dynamic balance particularly uneven surface and eyes closed;  loaded strengthening within limitations of knee pain    PT Home Exercise Plan Access Code: [email or web address]           Patient will benefit from skilled therapeutic intervention in order to improve the following deficits and impairments:  Decreased range of motion, Dizziness, Decreased endurance, Decreased activity tolerance, Pain, Decreased balance, Postural dysfunction, Decreased strength, Decreased mobility, Improper body mechanics  Visit Diagnosis: Other lack of coordination  Muscle weakness (generalized)     Problem List Patient Active Problem List   Diagnosis Date Noted  . 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 Essential hypertension 06/05/2019  . Hyperlipidemia 06/05/2019  . Carotid bruit 06/05/2019  . Syncope and collapse 06/05/2019  Lavinia Sharps, PT 07/27/19 9:48 AM Phone: [phone] Fax: [phone]  Vivien Presto 07/27/2019, 9:47 AM  [facility] [address], [address] [place], [place], [address] Phone: [phone]   Fax:  [phone]  Name: Jonathan Savage MRN: [id number] Date of Birth: [birth year]

## 2019-07-30 ENCOUNTER — Ambulatory Visit: Payer: Medicare Other | Admitting: Physical Therapy

## 2019-07-30 ENCOUNTER — Encounter: Payer: Self-pay | Admitting: Physical Therapy

## 2019-07-30 ENCOUNTER — Other Ambulatory Visit: Payer: Self-pay

## 2019-07-30 DIAGNOSIS — R278 Other lack of coordination: Secondary | ICD-10-CM | POA: Diagnosis not present

## 2019-07-30 DIAGNOSIS — M6281 Muscle weakness (generalized): Secondary | ICD-10-CM

## 2019-07-30 NOTE — Therapy (Signed)
[facility] [address] [place], [place], [address] Phone: [phone]   Fax:  [phone]  Physical Therapy Treatment  Patient Details  Name: Jonathan Savage MRN: [id number] Date of Birth: 04-08-1940 Referring Provider (PT): Farris Has, MD   Encounter Date: 07/30/2019   PT End of Session - 07/30/19 0927    Visit Number 6    Date for PT Re-Evaluation 08/21/19    Authorization Type Medicare    Progress Note Due on Visit 10    PT Start Time 0927    PT Stop Time 1015    PT Time Calculation (min) 48 min    Activity Tolerance Patient tolerated treatment well    Behavior During Therapy [facility] for tasks assessed/performed           History reviewed. No pertinent past medical history.  History reviewed. No pertinent surgical history.  There were no vitals filed for this visit.   Subjective Assessment - 07/30/19 0930    Subjective No new complaints. I did not get to the [facility] because I was at teh car show all afternoon.    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 No/denies    Multiple Pain Sites No                             [facility] Adult PT Treatment/Exercise - 07/30/19 0001      Knee/Hip Exercises: Aerobic   Nustep L3 x 10 min    PTA present to monitor pt's response.      Shoulder Exercises: Stretch   Other Shoulder Stretches Shoulder and trunk seated stretches with cane; VC for technique; discussed ways to anchor performing this stretch at home  to an already established routine.   Side bending, rotations, rotation with forward flexion: QL                 PT Education - 07/30/19 1015    Education Details Helped pt get on [employer] website to get access to class schedule, PTA printed pt weekly schedule    Person(s) Educated Patient            PT Short Term Goals -  07/27/19 0856      PT SHORT TERM GOAL #1   Title Pt will be ind with initial HEP.    Status Achieved      PT SHORT TERM GOAL #2   Title Pt will participate in a BERG Balance Assessment at first follow up visit to tease out any dynamic balance deficits, with LTG set if so.    Status Achieved             PT Long Term Goals - 07/18/19 0905      PT LONG TERM GOAL #5   Title Pt will perform TUG with use of pivot turn to reduce time to </= 9 sec.    Time 8    Period Weeks    Status Achieved   8 sec                Plan - 07/30/19 0933    Clinical Impression Statement Pt did not get to attend the exercise class but plans on trying it this week. We reviewewd and performed his trunk stretches and AROM exercises as he is not  doing them at home. PTA had suggestions for how to increase his compliance by anchoring the exercises to an already established behavior/routine. Pt will try performing some trunk stretches while he eats his morning cereal.    Personal Factors and Comorbidities Age;Fitness;Comorbidity 1;Social Background    Comorbidities Lt medial knee pain, currently monitoring BP/HR and working with MD on meds which may be contributing to dizziness/imbalance with dynamic gait, Hx of spinal stroke with Lt LE weakness, lost his wife 2 years ago    Examination-Activity Limitations Locomotion Level;Transfers;Bend;Squat;Stairs    Examination-Participation Restrictions Cleaning;Community Activity;Shop;Laundry;Yard Work    Stability/Clinical Decision Making Stable/Uncomplicated    Rehab Potential Excellent    PT Frequency 2x / week    PT Duration 8 weeks    PT Treatment/Interventions ADLs/Self Care Home Management;Gait training;Stair training;Functional mobility training;Therapeutic activities;Therapeutic exercise;Neuromuscular re-education;Balance training;Patient/family education;Manual techniques;Passive range of motion;Spinal Manipulations;Joint Manipulations    PT Next Visit Plan  Dynamic balance particularly uneven surface and eyes closed;  loaded strengthening within limitations of knee pain: measure pt's ROM and stregnth for LTG    PT Home Exercise Plan Access Code: [email or web address]    Consulted and Agree with Plan of Care Patient           Patient will benefit from skilled therapeutic intervention in order to improve the following deficits and impairments:  Decreased range of motion, Dizziness, Decreased endurance, Decreased activity tolerance, Pain, Decreased balance, Postural dysfunction, Decreased strength, Decreased mobility, Improper body mechanics  Visit Diagnosis: Other lack of coordination  Muscle weakness (generalized)     Problem List Patient Active Problem List   Diagnosis Date Noted  . 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 Essential hypertension 06/05/2019  . Hyperlipidemia 06/05/2019  . Carotid bruit 06/05/2019  . Syncope and collapse 06/05/2019    Jonathan Savage, PTA 07/30/2019, 10:47 AM  [facility] [address], [address] [place], [place], [address] Phone: [phone]   Fax:  [phone]  Name: Jonathan Savage MRN: [id number] Date of Birth: [birth year]

## 2019-08-02 ENCOUNTER — Other Ambulatory Visit: Payer: Self-pay

## 2019-08-02 ENCOUNTER — Ambulatory Visit: Payer: Medicare Other | Admitting: Physical Therapy

## 2019-08-02 DIAGNOSIS — R278 Other lack of coordination: Secondary | ICD-10-CM | POA: Diagnosis not present

## 2019-08-02 DIAGNOSIS — M6281 Muscle weakness (generalized): Secondary | ICD-10-CM

## 2019-08-02 NOTE — Patient Instructions (Signed)
Access Code: [email or web address] URL: [email or web address] Date: 08/02/2019 Prepared by: Lavinia Sharps  Exercises Seated Sidebending Arms Overhead - 1 x daily - 7 x weekly - 3 sets - 5 reps - 5 hold Sit to Stand with Arms Crossed - 1 x daily - 7 x weekly - 3 sets - 10 reps Seated Long Arc Quad - 1 x daily - 7 x weekly - 3 sets - 10 reps Seated March with Resistance - 1 x daily - 7 x weekly - 3 sets - 20 reps Seated Hip Abduction with Resistance - 1 x daily - 7 x weekly - 3 sets - 10 reps Standing Single Leg Stance with Counter Support - 1 x daily - 7 x weekly - 1 sets - 3 reps - 15 hold Standing Diagonal Shoulder Extension with Anchored Resistance - 1 x daily - 7 x weekly - 2 sets - 10 reps Standing Bilateral Low Shoulder Row with Anchored Resistance - 1 x daily - 7 x weekly - 2 sets - 10 reps Shoulder Extension with Resistance - Palms Forward - 1 x daily - 7 x weekly - 2 sets - 10 reps Standing Heel Raise with Support - 1 x daily - 7 x weekly - 2 sets - 10 reps Standing Balance with Eyes Closed - 1 x daily - 7 x weekly - 1 sets - 3 reps - 10 hold Half Tandem Stance Balance with Eyes Closed and Head Nods - 1 x daily - 7 x weekly - 1 sets - 3 reps - 10 hold Romberg Stance with Eyes Closed - 1 x daily - 7 x weekly - 1 sets - 5 reps Half Dead Lift with Kettlebell - 1 x daily - 7 x weekly - 1 sets - 10 reps Push-Up on Counter - 1 x daily - 7 x weekly - 1 sets - 10 reps Forward Step Up - 1 x daily - 7 x weekly - 1 sets - 10 reps

## 2019-08-02 NOTE — Therapy (Signed)
[facility] [address] [place], [place], [address] Phone: [phone]   Fax:  [phone]  Physical Therapy Treatment/Discharge Summary   Patient Details  Name: Jonathan Savage MRN: [id number] Date of Birth: 05-Mar-1940 Referring Provider (PT): London Pepper, MD   Encounter Date: 08/02/2019   PT End of Session - 08/02/19 1931    Visit Number 7    Date for PT Re-Evaluation 08/21/19    Authorization Type Medicare    Progress Note Due on Visit 10    PT Start Time 0845    PT Stop Time 0925    PT Time Calculation (min) 40 min    Activity Tolerance Patient tolerated treatment well           No past medical history on file.  No past surgical history on file.  There were no vitals filed for this visit.   Subjective Assessment - 08/02/19 0846    Subjective I'm ready for today to be my last day.  I'm working out at [employer].    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, Hx of spinal cord stroke, neck fusion              [facility] PT Assessment - 08/02/19 0001      AROM   Lumbar Flexion 90    Lumbar Extension 33    Lumbar - Right Side Bend 35    Lumbar - Left Side Bend 35    Lumbar - Right Rotation 45    Lumbar - Left Rotation 45      Strength   Overall Strength Comments 5/5       Berg Balance Test   Sit to Stand Able to stand without using hands and stabilize independently    Standing Unsupported Able to stand safely 2 minutes    Sitting with Back Unsupported but Feet Supported on Floor or Stool Able to sit safely and securely 2 minutes    Stand to Sit Sits safely with minimal use of hands    Transfers Able to transfer safely, minor use of hands    Standing Unsupported with Eyes Closed Able to stand 10 seconds safely    Standing Unsupported with Feet Together Able to place feet together independently and stand 1 minute safely    From Standing, Reach  Forward with Outstretched Arm Can reach confidently >25 cm (10")    From Standing Position, Pick up Object from Floor Able to pick up shoe safely and easily    From Standing Position, Turn to Look Behind Over each Shoulder Looks behind from both sides and weight shifts well    Turn 360 Degrees Able to turn 360 degrees safely in 4 seconds or less    Standing Unsupported, Alternately Place Feet on Step/Stool Able to stand independently and safely and complete 8 steps in 20 seconds    Standing Unsupported, One Foot in Front Able to place foot tandem independently and hold 30 seconds    Standing on One Leg Able to lift leg independently and hold > 10 seconds    Total Score 56      Timed Up and Go Test   TUG Normal TUG    Normal TUG (seconds) 7                         [facility] Adult PT Treatment/Exercise - 08/02/19 0001  Knee/Hip Exercises: Stretches   Other Knee/Hip Stretches 2nd step hip flexor stretch with UE movements 10x right/left       Knee/Hip Exercises: Aerobic   Nustep L3 x 8 min    PTA present to monitor pt's response.      Knee/Hip Exercises: Standing   Forward Step Up Right;Left;10 reps;Hand Hold: 0;Step Height: 6"    Other Standing Knee Exercises 10# modified dead lifts 15x     Other Standing Knee Exercises review of HEP and self progression       Knee/Hip Exercises: Seated   Sit to Sand 5 reps      Shoulder Exercises: Standing   Other Standing Exercises counter push ups 15x                   PT Education - 08/02/19 0919    Education Details [email or web address] dead lifts, counter push ups, step ups    Person(s) Educated Patient    Methods Explanation;Demonstration;Handout    Comprehension Returned demonstration;Verbalized understanding            PT Short Term Goals - 08/02/19 0848      PT SHORT TERM GOAL #1   Title Pt will be ind with initial HEP.    Status Achieved      PT SHORT TERM GOAL #2   Title Pt will participate in a BERG Balance  Assessment at first follow up visit to tease out any dynamic balance deficits, with LTG set if so.    Baseline 54/56    Status Achieved             PT Long Term Goals - 08/02/19 0847      PT LONG TERM GOAL #1   Title Pt will be ind with advanced HEP for LE strength and spine ROM.    Status Achieved      PT LONG TERM GOAL #2   Title Pt will achieve improved trunk A/ROM of at least 10 deg each of bil lumbar SB, ext and bil rotation for improved mobility for functional tasks.    Status Achieved      PT LONG TERM GOAL #3   Title Pt will achieve at least 4+/5 throughout bil LEs for improved ability to rise from deep squat and do stairs with more confidence.    Status Achieved      PT LONG TERM GOAL #4   Title Pt will improve 5x sit to stand to 9 sec or less for improved strength and endurance for transfers    Status Achieved      PT LONG TERM GOAL #5   Title Pt will perform TUG with use of pivot turn to reduce time to </= 9 sec.    Status Achieved      PT LONG TERM GOAL #6   Title Pt will report improved perception of strength, endurance and steadiness for all daily tasks by at least 70%    Status Achieved                 Plan - 08/02/19 0915    Clinical Impression Statement The patient has signficantly improved trunk ROM in all planes including flexion with all fingers and almost palms touching the floor.  His BERG balance score is 56/56 with much improved single leg standing 15 sec and improved narrow base of support challenges.  He has been instructed in a HEP for further improvements in strength and [facility]  Will discharge from PT at this time with all rehab goals met.    PT Home Exercise Plan Access Code: [email or web address]           Patient will benefit from skilled therapeutic intervention in order to improve the following deficits and impairments:     Visit Diagnosis: Other lack of coordination  Muscle weakness (generalized)     PHYSICAL THERAPY DISCHARGE SUMMARY  Visits from Start of Care: 7  Current functional level related to goals / functional outcomes: See clinical impressions above   Remaining deficits: As above   Education / Equipment: HEP Plan: Patient agrees to discharge.  Patient goals were met. Patient is being discharged due to meeting the stated rehab goals.  ?????        Problem List Patient Active Problem List   Diagnosis Date Noted  . Claudication in peripheral vascular disease ([place]) 06/08/2019  . Essential hypertension 06/05/2019  . Hyperlipidemia 06/05/2019  . Carotid bruit 06/05/2019  . Syncope and collapse 06/05/2019   Ruben Im, PT 08/02/19 7:36 PM Phone: [phone] Fax: [phone] Alvera Singh 08/02/2019, 7:35 PM  [facility] [address], [place], [place], [address] Phone: [phone]   Fax:  [phone]  Name: Jonathan Savage MRN: [id number] Date of Birth: 10-15-1940

## 2019-08-06 ENCOUNTER — Other Ambulatory Visit: Payer: Self-pay

## 2019-08-06 ENCOUNTER — Ambulatory Visit (HOSPITAL_COMMUNITY)
Admission: RE | Admit: 2019-08-06 | Discharge: 2019-08-06 | Disposition: A | Discharge status: Home / Self Care | Payer: Medicare Other | Source: Ambulatory Visit | Attending: Surgery | Admitting: Surgery

## 2019-08-06 ENCOUNTER — Ambulatory Visit (INDEPENDENT_AMBULATORY_CARE_PROVIDER_SITE_OTHER): Payer: Medicare Other | Admitting: Surgery

## 2019-08-06 ENCOUNTER — Encounter: Payer: Self-pay | Admitting: Surgery

## 2019-08-06 VITALS — BP 146/75 | HR 63 | Temp 98.0°F | Resp 20 | Ht 67.0 in | Wt 145.0 lb

## 2019-08-06 DIAGNOSIS — I6529 Occlusion and stenosis of unspecified carotid artery: Secondary | ICD-10-CM | POA: Insufficient documentation

## 2019-08-06 DIAGNOSIS — I6522 Occlusion and stenosis of left carotid artery: Secondary | ICD-10-CM | POA: Diagnosis not present

## 2019-08-06 MED ORDER — ROSUVASTATIN CALCIUM 5 MG PO TABS: 5.0000 mg | ORAL_TABLET | Freq: Every day | ORAL | 10 refills | Status: DC

## 2019-08-06 NOTE — H&P (View-Only) (Signed)
[facility] [place]  Patient name: Jonathan Savage MRN: [id number] DOB: [birth year] Sex: male   REQUESTING PROVIDER:    Dr. Allyson Sabal   REASON FOR CONSULT:    Carotid stenosis  HISTORY OF PRESENT ILLNESS:   Dylon Correa is a 79 y.o. male, who was found to have a bruit on and exam which led to carotid Dopplers showing a high-grade left carotid stenosis.  He is asymptomatic.  Specifically he denies any new issues with motor or sensory function in his extremities.  He denies slurred speech or amaurosis fugax.  Patient does have history of stroke 10 years ago which left him with some moderate left upper and lower extremity weaknes/tingling s.  He is medically managed for hypertension.  He is a former smoker.  He is not on a statin.  He tried 1 many many years ago and did not like the way it made him feel  PAST MEDICAL HISTORY    Past Medical History:  Diagnosis Date  . Hypertension   . Stroke [facility])      FAMILY HISTORY   History reviewed. No pertinent family history.  SOCIAL HISTORY:   Social History   Socioeconomic History  . Marital status: Widowed    Spouse name: Not on file  . Number of children: Not on file  . Years of education: Not on file  . Highest education level: Not on file  Occupational History  . Not on file  Tobacco Use  . Smoking status: Former Games developer  . Smokeless tobacco: Never Used  Vaping Use  . Vaping Use: Never used  Substance and Sexual Activity  . Alcohol use: Never  . Drug use: Never  . Sexual activity: Not on file  Other Topics Concern  . Not on file  Social History Narrative  . Not on file   Social Determinants of Health   Financial Resource Strain:   . Difficulty of Paying Living Expenses:   Food Insecurity:   . Worried About Programme researcher, broadcasting/film/video in the Last Year:   . Barista in the Last Year:   Transportation Needs:   . Freight forwarder (Medical):   Marland Kitchen Lack of  Transportation (Non-Medical):   Physical Activity:   . Days of Exercise per Week:   . Minutes of Exercise per Session:   Stress:   . Feeling of Stress :   Social Connections:   . Frequency of Communication with Friends and Family:   . Frequency of Social Gatherings with Friends and Family:   . Attends Religious Services:   . Active Member of Clubs or Organizations:   . Attends Banker Meetings:   Marland Kitchen Marital Status:   Intimate Partner Violence:   . Fear of Current or Ex-Partner:   . Emotionally Abused:   Marland Kitchen Physically Abused:   . Sexually Abused:     ALLERGIES:    No Known Allergies  CURRENT MEDICATIONS:    Current Outpatient Medications  Medication Sig Dispense Refill  . acetaminophen (TYLENOL) 500 MG tablet Take 1,000 mg by mouth every 8 (eight) hours as needed for mild pain.    Marland Kitchen amLODipine (NORVASC) 2.5 MG tablet Take 3 tablets (7.5 mg total) by mouth daily. 90 tablet 1  . aspirin EC 81 MG tablet Take 81 mg by mouth daily.    Marland Kitchen losartan (COZAAR) 100 MG tablet Take 100 mg by mouth every evening.      No current facility-administered medications for this  visit.    REVIEW OF SYSTEMS:   [X] denotes positive finding, [ ] denotes negative finding Cardiac  Comments:  Chest pain or chest pressure:    Shortness of breath upon exertion:    Short of breath when lying flat:    Irregular heart rhythm:        Vascular    Pain in calf, thigh, or hip brought on by ambulation:    Pain in feet at night that wakes you up from your sleep:     Blood clot in your veins:    Leg swelling:         Pulmonary    Oxygen at home:    Productive cough:     Wheezing:         Neurologic    Sudden weakness in arms or legs:     Sudden numbness in arms or legs:     Sudden onset of difficulty speaking or slurred speech:    Temporary loss of vision in one eye:     Problems with dizziness:         Gastrointestinal    Blood in stool:      Vomited blood:           Genitourinary    Burning when urinating:     Blood in urine:        Psychiatric    Major depression:         Hematologic    Bleeding problems:    Problems with blood clotting too easily:        Skin    Rashes or ulcers:        Constitutional    Fever or chills:     PHYSICAL EXAM:   Vitals:   08/06/19 1352  BP: (!) 149/78  Pulse: 63  Resp: 20  Temp: 98 F (36.7 C)  SpO2: 95%  Weight: 145 lb (65.8 kg)  Height: 5' 7" (1.702 m)    GENERAL: The patient is a well-nourished male, in no acute distress. The vital signs are documented above. CARDIAC: There is a regular rate and rhythm.  VASCULAR: Palpable dorsalis pedis pulses bilaterally PULMONARY: Nonlabored respirations ABDOMEN: Soft and non-tender.  No pulsatile mass.  MUSCULOSKELETAL: There are no major deformities or cyanosis. NEUROLOGIC: No focal weakness or paresthesias are detected. SKIN: There are no ulcers or rashes noted. PSYCHIATRIC: The patient has a normal affect.  STUDIES:   I have reviewed the following carotid duplex: Right Carotid: Velocities in the right ICA are consistent with a 1-39%  stenosis.   Left Carotid: Velocities in the left ICA are consistent with a 80-99%  stenosis.   Vertebrals: Bilateral vertebral arteries demonstrate antegrade flow.  Subclavians: Normal flow hemodynamics were seen in bilateral subclavian        arteries.   ASSESSMENT and PLAN   Asymptomatic left carotid stenosis: We discussed treatment options including stenting versus endarterectomy.  I feel he is a better candidate for endarterectomy.  I discussed the risks of the procedure including but not limited to the risk of stroke, nerve injury, and bleeding.  All of his questions were answered.  He has been scheduled for left carotid endarterectomy on Wednesday, August 18.  Hypercholesterolemia: The patient is currently not on a statin.  I am starting you on 5 mg of Crestor   Wells Olan Kurek, IV, MD,  FACS [facility] [place] Tel [phone] Pager [phone] 

## 2019-08-06 NOTE — Progress Notes (Signed)
[facility] [place]  Patient name: Jonathan Savage MRN: [id number] DOB: [birth year] Sex: male   REQUESTING PROVIDER:    Dr. Allyson Sabal   REASON FOR CONSULT:    Carotid stenosis  HISTORY OF PRESENT ILLNESS:   Jonathan Savage is a 79 y.o. male, who was found to have a bruit on and exam which led to carotid Dopplers showing a high-grade left carotid stenosis.  He is asymptomatic.  Specifically he denies any new issues with motor or sensory function in his extremities.  He denies slurred speech or amaurosis fugax.  Patient does have history of stroke 10 years ago which left him with some moderate left upper and lower extremity weaknes/tingling s.  He is medically managed for hypertension.  He is a former smoker.  He is not on a statin.  He tried 1 many many years ago and did not like the way it made him feel  PAST MEDICAL HISTORY    Past Medical History:  Diagnosis Date  . Hypertension   . Stroke [facility])      FAMILY HISTORY   History reviewed. No pertinent family history.  SOCIAL HISTORY:   Social History   Socioeconomic History  . Marital status: Widowed    Spouse name: Not on file  . Number of children: Not on file  . Years of education: Not on file  . Highest education level: Not on file  Occupational History  . Not on file  Tobacco Use  . Smoking status: Former Games developer  . Smokeless tobacco: Never Used  Vaping Use  . Vaping Use: Never used  Substance and Sexual Activity  . Alcohol use: Never  . Drug use: Never  . Sexual activity: Not on file  Other Topics Concern  . Not on file  Social History Narrative  . Not on file   Social Determinants of Health   Financial Resource Strain:   . Difficulty of Paying Living Expenses:   Food Insecurity:   . Worried About Programme researcher, broadcasting/film/video in the Last Year:   . Barista in the Last Year:   Transportation Needs:   . Freight forwarder (Medical):   Marland Kitchen Lack of  Transportation (Non-Medical):   Physical Activity:   . Days of Exercise per Week:   . Minutes of Exercise per Session:   Stress:   . Feeling of Stress :   Social Connections:   . Frequency of Communication with Friends and Family:   . Frequency of Social Gatherings with Friends and Family:   . Attends Religious Services:   . Active Member of Clubs or Organizations:   . Attends Banker Meetings:   Marland Kitchen Marital Status:   Intimate Partner Violence:   . Fear of Current or Ex-Partner:   . Emotionally Abused:   Marland Kitchen Physically Abused:   . Sexually Abused:     ALLERGIES:    No Known Allergies  CURRENT MEDICATIONS:    Current Outpatient Medications  Medication Sig Dispense Refill  . acetaminophen (TYLENOL) 500 MG tablet Take 1,000 mg by mouth every 8 (eight) hours as needed for mild pain.    Marland Kitchen amLODipine (NORVASC) 2.5 MG tablet Take 3 tablets (7.5 mg total) by mouth daily. 90 tablet 1  . aspirin EC 81 MG tablet Take 81 mg by mouth daily.    Marland Kitchen losartan (COZAAR) 100 MG tablet Take 100 mg by mouth every evening.      No current facility-administered medications for this  visit.    REVIEW OF SYSTEMS:   [X]  denotes positive finding, [ ]  denotes negative finding Cardiac  Comments:  Chest pain or chest pressure:    Shortness of breath upon exertion:    Short of breath when lying flat:    Irregular heart rhythm:        Vascular    Pain in calf, thigh, or hip brought on by ambulation:    Pain in feet at night that wakes you up from your sleep:     Blood clot in your veins:    Leg swelling:         Pulmonary    Oxygen at home:    Productive cough:     Wheezing:         Neurologic    Sudden weakness in arms or legs:     Sudden numbness in arms or legs:     Sudden onset of difficulty speaking or slurred speech:    Temporary loss of vision in one eye:     Problems with dizziness:         Gastrointestinal    Blood in stool:      Vomited blood:           Genitourinary    Burning when urinating:     Blood in urine:        Psychiatric    Major depression:         Hematologic    Bleeding problems:    Problems with blood clotting too easily:        Skin    Rashes or ulcers:        Constitutional    Fever or chills:     PHYSICAL EXAM:   Vitals:   08/06/19 1352  BP: (!) 149/78  Pulse: 63  Resp: 20  Temp: 98 F (36.7 C)  SpO2: 95%  Weight: 145 lb (65.8 kg)  Height: 5\' 7"  (1.702 m)    GENERAL: The patient is a well-nourished male, in no acute distress. The vital signs are documented above. CARDIAC: There is a regular rate and rhythm.  VASCULAR: Palpable dorsalis pedis pulses bilaterally PULMONARY: Nonlabored respirations ABDOMEN: Soft and non-tender.  No pulsatile mass.  MUSCULOSKELETAL: There are no major deformities or cyanosis. NEUROLOGIC: No focal weakness or paresthesias are detected. SKIN: There are no ulcers or rashes noted. PSYCHIATRIC: The patient has a normal affect.  STUDIES:   I have reviewed the following carotid duplex: Right Carotid: Velocities in the right ICA are consistent with a 1-39%  stenosis.   Left Carotid: Velocities in the left ICA are consistent with a 80-99%  stenosis.   Vertebrals: Bilateral vertebral arteries demonstrate antegrade flow.  Subclavians: Normal flow hemodynamics were seen in bilateral subclavian        arteries.   ASSESSMENT and PLAN   Asymptomatic left carotid stenosis: We discussed treatment options including stenting versus endarterectomy.  I feel he is a better candidate for endarterectomy.  I discussed the risks of the procedure including but not limited to the risk of stroke, nerve injury, and bleeding.  All of his questions were answered.  He has been scheduled for left carotid endarterectomy on Wednesday, August 18.  Hypercholesterolemia: The patient is currently not on a statin.  I am starting you on 5 mg of Crestor   10/06/19, MD,  FACS [facility] [phone] Pager [phone]

## 2019-08-07 ENCOUNTER — Encounter: Payer: Medicare Other | Admitting: Physical Therapy

## 2019-08-08 ENCOUNTER — Other Ambulatory Visit: Payer: Self-pay

## 2019-08-09 ENCOUNTER — Encounter: Payer: Medicare Other | Admitting: Physical Therapy

## 2019-08-13 ENCOUNTER — Encounter: Payer: Medicare Other | Admitting: Physical Therapy

## 2019-08-15 NOTE — Pre-Procedure Instructions (Signed)
[id number] - [place], [place] - [address] [place] [address] Phone: [phone] Fax: [phone]     Your procedure is scheduled on Wednesday, August 18.  Report to [facility] Main Entrance "A" at 06:30 A.M., and check in at the Admitting office.  Call this number if you have problems the morning of surgery:  [phone]  Call [phone] if you have any questions prior to your surgery date Monday-Friday 8am-4pm.    Remember:  Do not eat or drink after midnight the night before your surgery.     Take these medicines the morning of surgery with A SIP OF WATER: amLODipine (NORVASC)  rosuvastatin (CRESTOR)   *Follow your surgeon's instructions on when to stop Aspirin.  If no instructions were given by your surgeon then you will need to call the office to get those instructions.     As of today, STOP taking any Aleve, Naproxen, Ibuprofen, Motrin, Advil, Goody's, BC's, all herbal medications, fish oil, and all vitamins.       The Morning of Surgery:               Do not wear jewelry.            Do not wear lotions, powders, colognes, or deodorant.            Men may shave face and neck.            Do not bring valuables to the hospital.            [facility] is not responsible for any belongings or valuables.  Do NOT Smoke (Tobacco/Vaping) or drink Alcohol 24 hours prior to your procedure.  If you use a CPAP at night, you may bring all equipment for your overnight stay.   Contacts, glasses, dentures or bridgework may not be worn into surgery.      For patients admitted to the hospital, discharge time will be determined by your treatment team.   Patients discharged the day of surgery will not be allowed to drive home, and someone needs to stay with them for 24 hours.    Special instructions:   [place]- Preparing For Surgery  Before surgery, you can play an important role. Because skin is  not sterile, your skin needs to be as free of germs as possible. You can reduce the number of germs on your skin by washing with CHG (chlorahexidine gluconate) Soap before surgery.  CHG is an antiseptic cleaner which kills germs and bonds with the skin to continue killing germs even after washing.    Oral Hygiene is also important to reduce your risk of infection.  Remember - BRUSH YOUR TEETH THE MORNING OF SURGERY WITH YOUR REGULAR TOOTHPASTE  Please do not use if you have an allergy to CHG or antibacterial soaps. If your skin becomes reddened/irritated stop using the CHG.  Do not shave (including legs and underarms) for at least 48 hours prior to first CHG shower. It is OK to shave your face.  Please follow these instructions carefully.   1. Shower the NIGHT BEFORE SURGERY and the MORNING OF SURGERY with CHG Soap.   2. If you chose to wash your hair, wash your hair first as usual with your normal shampoo.  3. After you shampoo, rinse your hair and body thoroughly to remove the shampoo.  4. Use CHG as you would any other liquid soap. You can  apply CHG directly to the skin and wash gently with a scrungie or a clean washcloth.   5. Apply the CHG Soap to your body ONLY FROM THE NECK DOWN.  Do not use on open wounds or open sores. Avoid contact with your eyes, ears, mouth and genitals (private parts). Wash Face and genitals (private parts)  with your normal soap.   6. Wash thoroughly, paying special attention to the area where your surgery will be performed.  7. Thoroughly rinse your body with warm water from the neck down.  8. DO NOT shower/wash with your normal soap after using and rinsing off the CHG Soap.  9. Pat yourself dry with a CLEAN TOWEL.  10. Wear CLEAN PAJAMAS to bed the night before surgery  11. Place CLEAN SHEETS on your bed the night of your first shower and DO NOT SLEEP WITH PETS.   Day of Surgery: Wear Clean/Comfortable clothing the morning of surgery. Do not apply  any deodorants/lotions.   Remember to brush your teeth WITH YOUR REGULAR TOOTHPASTE.   Please read over the following fact sheets that you were given.

## 2019-08-16 ENCOUNTER — Other Ambulatory Visit: Payer: Self-pay

## 2019-08-16 ENCOUNTER — Encounter (HOSPITAL_COMMUNITY)
Admission: RE | Admit: 2019-08-16 | Discharge: 2019-08-16 | Disposition: A | Discharge status: Home / Self Care | Payer: Medicare Other | Source: Ambulatory Visit | Attending: Surgery | Admitting: Surgery

## 2019-08-16 ENCOUNTER — Encounter (HOSPITAL_COMMUNITY): Payer: Self-pay

## 2019-08-16 DIAGNOSIS — Z01812 Encounter for preprocedural laboratory examination: Secondary | ICD-10-CM | POA: Insufficient documentation

## 2019-08-16 HISTORY — DX: Other complications of anesthesia, initial encounter: T88.59XA

## 2019-08-16 LAB — CBC
HCT: 45.3 % (ref 39.0–52.0)
Hemoglobin: 14.4 g/dL (ref 13.0–17.0)
MCH: 29.2 pg (ref 26.0–34.0)
MCHC: 31.8 g/dL (ref 30.0–36.0)
MCV: 91.9 fL (ref 80.0–100.0)
Platelets: 338 10*3/uL (ref 150–400)
RBC: 4.93 MIL/uL (ref 4.22–5.81)
RDW: 13.4 % (ref 11.5–15.5)
WBC: 6.4 10*3/uL (ref 4.0–10.5)
nRBC: 0 % (ref 0.0–0.2)

## 2019-08-16 LAB — COMPREHENSIVE METABOLIC PANEL
ALT: 19 U/L (ref 0–44)
AST: 20 U/L (ref 15–41)
Albumin: 4.2 g/dL (ref 3.5–5.0)
Alkaline Phosphatase: 57 U/L (ref 38–126)
Anion gap: 8 (ref 5–15)
BUN: 30 mg/dL — ABNORMAL HIGH (ref 8–23)
CO2: 28 mmol/L (ref 22–32)
Calcium: 9.8 mg/dL (ref 8.9–10.3)
Chloride: 103 mmol/L (ref 98–111)
Creatinine, Ser: 1.45 mg/dL — ABNORMAL HIGH (ref 0.61–1.24)
GFR calc Af Amer: 52 mL/min — ABNORMAL LOW (ref >60)
GFR calc non Af Amer: 45 mL/min — ABNORMAL LOW (ref >60)
Glucose, Bld: 95 mg/dL (ref 70–99)
Potassium: 4.6 mmol/L (ref 3.5–5.1)
Sodium: 139 mmol/L (ref 135–145)
Total Bilirubin: 0.7 mg/dL (ref 0.3–1.2)
Total Protein: 7.7 g/dL (ref 6.5–8.1)

## 2019-08-16 LAB — URINALYSIS, ROUTINE W REFLEX MICROSCOPIC
Bilirubin Urine: NEGATIVE
Glucose, UA: NEGATIVE mg/dL
Hgb urine dipstick: NEGATIVE
Ketones, ur: NEGATIVE mg/dL
Leukocytes,Ua: NEGATIVE
Nitrite: NEGATIVE
Protein, ur: NEGATIVE mg/dL
Specific Gravity, Urine: 1.016 (ref 1.005–1.030)
pH: 5 (ref 5.0–8.0)

## 2019-08-16 LAB — APTT: aPTT: 29 seconds (ref 24–36)

## 2019-08-16 LAB — PROTIME-INR
INR: 1 (ref 0.8–1.2)
Prothrombin Time: 12.6 seconds (ref 11.4–15.2)

## 2019-08-16 LAB — TYPE AND SCREEN
ABO/RH(D): A POS
Antibody Screen: NEGATIVE

## 2019-08-16 LAB — SURGICAL PCR SCREEN
MRSA, PCR: NEGATIVE
Staphylococcus aureus: NEGATIVE

## 2019-08-16 NOTE — Progress Notes (Signed)
PCP - Dr. Farris Has Cardiologist - Dr. Allyson Sabal  Chest x-ray - N/A EKG - 06/12/19 Stress Test -06/21/19 ECHO - N/A Cardiac Cath - N/A   Sleep Study - denies  Aspirin Instructions: per pt- to continue ASA; Patient instructed to hold all  NSAID's, herbal medications, fish oil and vitamins 7 days prior to surgery.   Anesthesia review:   Patient denies shortness of breath, fever, cough and chest pain at PAT appointment   Patient verbalized understanding of instructions that were given to them at the PAT appointment. Patient was also instructed that they will need to review over the PAT instructions again at home before surgery.

## 2019-08-16 NOTE — Progress Notes (Signed)
°   08/16/19 0819  OBSTRUCTIVE SLEEP APNEA  Have you ever been diagnosed with sleep apnea through a sleep study? No  Do you snore loudly (loud enough to be heard through closed doors)?  1  Do you often feel tired, fatigued, or sleepy during the daytime (such as falling asleep during driving or talking to someone)? 0  Has anyone observed you stop breathing during your sleep? 1  Do you have, or are you being treated for high blood pressure? 1  BMI more than 35 kg/m2? 0  Age > 50 (1-yes) 1  Neck circumference greater than:Male 16 inches or larger, Male 17inches or larger? 0  Male Gender (Yes=1) 1  Obstructive Sleep Apnea Score 5  Score 5 or greater  Results sent to PCP

## 2019-08-20 ENCOUNTER — Other Ambulatory Visit (HOSPITAL_COMMUNITY)
Admission: RE | Admit: 2019-08-20 | Discharge: 2019-08-20 | Disposition: A | Discharge status: Home / Self Care | Payer: Medicare Other | Source: Ambulatory Visit | Attending: Surgery | Admitting: Surgery

## 2019-08-20 ENCOUNTER — Encounter: Payer: Medicare Other | Admitting: Physical Therapy

## 2019-08-20 DIAGNOSIS — Z01812 Encounter for preprocedural laboratory examination: Secondary | ICD-10-CM | POA: Insufficient documentation

## 2019-08-20 DIAGNOSIS — Z20822 Contact with and (suspected) exposure to covid-19: Secondary | ICD-10-CM | POA: Insufficient documentation

## 2019-08-20 LAB — SARS CORONAVIRUS 2 (TAT 6-24 HRS): SARS Coronavirus 2: NEGATIVE

## 2019-08-22 ENCOUNTER — Encounter (HOSPITAL_COMMUNITY)
Admission: RE | Disposition: A | Discharge status: Home / Self Care | Payer: Self-pay | Source: Home / Self Care | Attending: Surgery

## 2019-08-22 ENCOUNTER — Inpatient Hospital Stay (HOSPITAL_COMMUNITY): Payer: Medicare Other | Admitting: Certified Registered Nurse Anesthetist

## 2019-08-22 ENCOUNTER — Inpatient Hospital Stay (HOSPITAL_COMMUNITY)
Admission: RE | Admit: 2019-08-22 | Discharge: 2019-08-23 | DRG: 039 | Disposition: A | Discharge status: Home Health | Payer: Medicare Other | Attending: Surgery | Admitting: Surgery

## 2019-08-22 ENCOUNTER — Encounter (HOSPITAL_COMMUNITY): Payer: Self-pay | Admitting: Surgery

## 2019-08-22 ENCOUNTER — Other Ambulatory Visit: Payer: Self-pay

## 2019-08-22 DIAGNOSIS — I6522 Occlusion and stenosis of left carotid artery: Secondary | ICD-10-CM

## 2019-08-22 DIAGNOSIS — I6529 Occlusion and stenosis of unspecified carotid artery: Secondary | ICD-10-CM | POA: Diagnosis present

## 2019-08-22 DIAGNOSIS — I10 Essential (primary) hypertension: Secondary | ICD-10-CM | POA: Diagnosis present

## 2019-08-22 DIAGNOSIS — E785 Hyperlipidemia, unspecified: Secondary | ICD-10-CM | POA: Diagnosis present

## 2019-08-22 DIAGNOSIS — R55 Syncope and collapse: Secondary | ICD-10-CM | POA: Diagnosis not present

## 2019-08-22 DIAGNOSIS — Z8673 Personal history of transient ischemic attack (TIA), and cerebral infarction without residual deficits: Secondary | ICD-10-CM

## 2019-08-22 DIAGNOSIS — Z87891 Personal history of nicotine dependence: Secondary | ICD-10-CM

## 2019-08-22 DIAGNOSIS — R001 Bradycardia, unspecified: Secondary | ICD-10-CM | POA: Diagnosis present

## 2019-08-22 DIAGNOSIS — Z7982 Long term (current) use of aspirin: Secondary | ICD-10-CM

## 2019-08-22 DIAGNOSIS — Z20822 Contact with and (suspected) exposure to covid-19: Secondary | ICD-10-CM | POA: Diagnosis present

## 2019-08-22 DIAGNOSIS — Z79899 Other long term (current) drug therapy: Secondary | ICD-10-CM

## 2019-08-22 DIAGNOSIS — I959 Hypotension, unspecified: Secondary | ICD-10-CM | POA: Diagnosis present

## 2019-08-22 DIAGNOSIS — E7800 Pure hypercholesterolemia, unspecified: Secondary | ICD-10-CM | POA: Diagnosis present

## 2019-08-22 DIAGNOSIS — J029 Acute pharyngitis, unspecified: Secondary | ICD-10-CM | POA: Diagnosis not present

## 2019-08-22 HISTORY — PX: ENDARTERECTOMY: SHX5162

## 2019-08-22 LAB — ABO/RH: ABO/RH(D): A POS

## 2019-08-22 LAB — POCT ACTIVATED CLOTTING TIME: Activated Clotting Time: 230 seconds

## 2019-08-22 SURGERY — ENDARTERECTOMY, CAROTID
Anesthesia: General | Site: Neck | Laterality: Left

## 2019-08-22 MED ORDER — PANTOPRAZOLE SODIUM 40 MG PO TBEC
40.0000 mg | DELAYED_RELEASE_TABLET | Freq: Every day | ORAL | Status: DC
  Administered 2019-08-22 – 2019-08-23 (×2): 40 mg via ORAL
  Filled 2019-08-22 (×2): qty 1

## 2019-08-22 MED ORDER — POTASSIUM CHLORIDE CRYS ER 20 MEQ PO TBCR: 20.0000 meq | EXTENDED_RELEASE_TABLET | Freq: Every day | ORAL | Status: DC | PRN

## 2019-08-22 MED ORDER — ROCURONIUM BROMIDE 100 MG/10ML IV SOLN
INTRAVENOUS | Status: DC | PRN
  Administered 2019-08-22: 60 mg via INTRAVENOUS
  Administered 2019-08-22: 10 mg via INTRAVENOUS
  Administered 2019-08-22: 20 mg via INTRAVENOUS

## 2019-08-22 MED ORDER — OXYCODONE-ACETAMINOPHEN 5-325 MG PO TABS
1.0000 | ORAL_TABLET | ORAL | Status: DC | PRN
  Administered 2019-08-22: 2 via ORAL
  Filled 2019-08-22: qty 2
  Filled 2019-08-22: qty 1

## 2019-08-22 MED ORDER — HEPARIN SODIUM (PORCINE) 5000 UNIT/ML IJ SOLN: 5000.0000 [IU] | Freq: Three times a day (TID) | INTRAMUSCULAR | Status: DC

## 2019-08-22 MED ORDER — LABETALOL HCL 5 MG/ML IV SOLN: 10.0000 mg | INTRAVENOUS | Status: DC | PRN

## 2019-08-22 MED ORDER — CHLORHEXIDINE GLUCONATE CLOTH 2 % EX PADS: 6.0000 | MEDICATED_PAD | Freq: Once | CUTANEOUS | Status: DC

## 2019-08-22 MED ORDER — PROPOFOL 10 MG/ML IV BOLUS
INTRAVENOUS | Status: DC | PRN
  Administered 2019-08-22: 120 mg via INTRAVENOUS

## 2019-08-22 MED ORDER — ROSUVASTATIN CALCIUM 5 MG PO TABS
5.0000 mg | ORAL_TABLET | Freq: Every day | ORAL | Status: DC
  Administered 2019-08-22: 5 mg via ORAL
  Filled 2019-08-22 (×2): qty 1

## 2019-08-22 MED ORDER — PHENYLEPHRINE HCL-NACL 10-0.9 MG/250ML-% IV SOLN
INTRAVENOUS | Status: DC | PRN
  Administered 2019-08-22: 50 ug/min via INTRAVENOUS

## 2019-08-22 MED ORDER — ACETAMINOPHEN 650 MG RE SUPP: 325.0000 mg | RECTAL | Status: DC | PRN

## 2019-08-22 MED ORDER — ONDANSETRON HCL 4 MG/2ML IJ SOLN
INTRAMUSCULAR | Status: DC | PRN
Start: 2019-08-22 — End: 2019-08-22
  Administered 2019-08-22: 4 mg via INTRAVENOUS

## 2019-08-22 MED ORDER — DEXAMETHASONE SODIUM PHOSPHATE 10 MG/ML IJ SOLN
INTRAMUSCULAR | Status: AC
  Filled 2019-08-22: qty 1

## 2019-08-22 MED ORDER — SODIUM CHLORIDE 0.9 % IV SOLN: INTRAVENOUS | Status: DC

## 2019-08-22 MED ORDER — HEPARIN SODIUM (PORCINE) 1000 UNIT/ML IJ SOLN
INTRAMUSCULAR | Status: AC
  Filled 2019-08-22: qty 1

## 2019-08-22 MED ORDER — ONDANSETRON HCL 4 MG/2ML IJ SOLN: 4.0000 mg | Freq: Four times a day (QID) | INTRAMUSCULAR | Status: DC | PRN

## 2019-08-22 MED ORDER — FENTANYL CITRATE (PF) 250 MCG/5ML IJ SOLN
INTRAMUSCULAR | Status: DC | PRN
  Administered 2019-08-22: 50 ug via INTRAVENOUS
  Administered 2019-08-22: 150 ug via INTRAVENOUS

## 2019-08-22 MED ORDER — HYDRALAZINE HCL 20 MG/ML IJ SOLN: 5.0000 mg | INTRAMUSCULAR | Status: DC | PRN

## 2019-08-22 MED ORDER — BISACODYL 5 MG PO TBEC: 5.0000 mg | DELAYED_RELEASE_TABLET | Freq: Every day | ORAL | Status: DC | PRN

## 2019-08-22 MED ORDER — MAGNESIUM SULFATE 2 GM/50ML IV SOLN: 2.0000 g | Freq: Every day | INTRAVENOUS | Status: DC | PRN

## 2019-08-22 MED ORDER — METOPROLOL TARTRATE 5 MG/5ML IV SOLN: 2.0000 mg | INTRAVENOUS | Status: DC | PRN

## 2019-08-22 MED ORDER — PROPOFOL 10 MG/ML IV BOLUS
INTRAVENOUS | Status: AC
  Filled 2019-08-22: qty 20

## 2019-08-22 MED ORDER — SODIUM CHLORIDE 0.9 % IV SOLN: 500.0000 mL | Freq: Once | INTRAVENOUS | Status: DC | PRN

## 2019-08-22 MED ORDER — LACTATED RINGERS IV SOLN: INTRAVENOUS | Status: DC

## 2019-08-22 MED ORDER — HYDROMORPHONE HCL 1 MG/ML IJ SOLN
0.2500 mg | INTRAMUSCULAR | Status: DC | PRN
  Administered 2019-08-22: 0.5 mg via INTRAVENOUS

## 2019-08-22 MED ORDER — CLEVIDIPINE BUTYRATE 0.5 MG/ML IV EMUL
INTRAVENOUS | Status: DC | PRN
  Administered 2019-08-22: 1 mg/h via INTRAVENOUS

## 2019-08-22 MED ORDER — SUGAMMADEX SODIUM 200 MG/2ML IV SOLN
INTRAVENOUS | Status: DC | PRN
  Administered 2019-08-22: 260 mg via INTRAVENOUS

## 2019-08-22 MED ORDER — ONDANSETRON HCL 4 MG/2ML IJ SOLN
INTRAMUSCULAR | Status: AC
  Filled 2019-08-22: qty 2

## 2019-08-22 MED ORDER — LIDOCAINE 2% (20 MG/ML) 5 ML SYRINGE
INTRAMUSCULAR | Status: AC
  Filled 2019-08-22: qty 5

## 2019-08-22 MED ORDER — HYDROMORPHONE HCL 1 MG/ML IJ SOLN: 0.5000 mg | INTRAMUSCULAR | Status: DC | PRN

## 2019-08-22 MED ORDER — HYDROMORPHONE HCL 1 MG/ML IJ SOLN
INTRAMUSCULAR | Status: AC
  Filled 2019-08-22: qty 1

## 2019-08-22 MED ORDER — PHENOL 1.4 % MT LIQD: 1.0000 | OROMUCOSAL | Status: DC | PRN

## 2019-08-22 MED ORDER — LIDOCAINE HCL (PF) 1 % IJ SOLN
INTRAMUSCULAR | Status: AC
  Filled 2019-08-22: qty 30

## 2019-08-22 MED ORDER — HEPARIN SODIUM (PORCINE) 1000 UNIT/ML IJ SOLN
INTRAMUSCULAR | Status: DC | PRN
  Administered 2019-08-22: 7000 [IU] via INTRAVENOUS

## 2019-08-22 MED ORDER — CEFAZOLIN SODIUM-DEXTROSE 2-4 GM/100ML-% IV SOLN
2.0000 g | INTRAVENOUS | Status: AC
  Administered 2019-08-22: 2 g via INTRAVENOUS
  Filled 2019-08-22: qty 100

## 2019-08-22 MED ORDER — ASPIRIN EC 81 MG PO TBEC
81.0000 mg | DELAYED_RELEASE_TABLET | Freq: Every day | ORAL | Status: DC
  Administered 2019-08-23: 81 mg via ORAL
  Filled 2019-08-22: qty 1

## 2019-08-22 MED ORDER — CHLORHEXIDINE GLUCONATE 0.12 % MT SOLN
15.0000 mL | Freq: Once | OROMUCOSAL | Status: AC
  Administered 2019-08-22: 15 mL via OROMUCOSAL
  Filled 2019-08-22: qty 15

## 2019-08-22 MED ORDER — PROTAMINE SULFATE 10 MG/ML IV SOLN
INTRAVENOUS | Status: AC
  Filled 2019-08-22: qty 5

## 2019-08-22 MED ORDER — PHENYLEPHRINE 40 MCG/ML (10ML) SYRINGE FOR IV PUSH (FOR BLOOD PRESSURE SUPPORT)
PREFILLED_SYRINGE | INTRAVENOUS | Status: DC | PRN
  Administered 2019-08-22: 80 ug via INTRAVENOUS
  Administered 2019-08-22: 40 ug via INTRAVENOUS
  Administered 2019-08-22: 120 ug via INTRAVENOUS
  Administered 2019-08-22: 80 ug via INTRAVENOUS

## 2019-08-22 MED ORDER — ACETAMINOPHEN 325 MG PO TABS: 325.0000 mg | ORAL_TABLET | ORAL | Status: DC | PRN

## 2019-08-22 MED ORDER — PROTAMINE SULFATE 10 MG/ML IV SOLN
INTRAVENOUS | Status: DC | PRN
  Administered 2019-08-22: 40 mg via INTRAVENOUS
  Administered 2019-08-22: 10 mg via INTRAVENOUS

## 2019-08-22 MED ORDER — SENNOSIDES-DOCUSATE SODIUM 8.6-50 MG PO TABS: 1.0000 | ORAL_TABLET | Freq: Every evening | ORAL | Status: DC | PRN

## 2019-08-22 MED ORDER — HEMOSTATIC AGENTS (NO CHARGE) OPTIME
TOPICAL | Status: DC | PRN
  Administered 2019-08-22: 1 via TOPICAL

## 2019-08-22 MED ORDER — GUAIFENESIN-DM 100-10 MG/5ML PO SYRP
15.0000 mL | ORAL_SOLUTION | ORAL | Status: DC | PRN
  Administered 2019-08-23: 15 mL via ORAL
  Filled 2019-08-22: qty 15

## 2019-08-22 MED ORDER — ALUM & MAG HYDROXIDE-SIMETH 200-200-20 MG/5ML PO SUSP: 15.0000 mL | ORAL | Status: DC | PRN

## 2019-08-22 MED ORDER — ESMOLOL HCL 100 MG/10ML IV SOLN
INTRAVENOUS | Status: AC
  Filled 2019-08-22: qty 10

## 2019-08-22 MED ORDER — AMLODIPINE BESYLATE 5 MG PO TABS: 7.5000 mg | ORAL_TABLET | Freq: Every day | ORAL | Status: DC

## 2019-08-22 MED ORDER — DOCUSATE SODIUM 100 MG PO CAPS
100.0000 mg | ORAL_CAPSULE | Freq: Every day | ORAL | Status: DC
  Administered 2019-08-23: 100 mg via ORAL
  Filled 2019-08-22: qty 1

## 2019-08-22 MED ORDER — 0.9 % SODIUM CHLORIDE (POUR BTL) OPTIME
TOPICAL | Status: DC | PRN
  Administered 2019-08-22: 2000 mL

## 2019-08-22 MED ORDER — AMLODIPINE BESYLATE 5 MG PO TABS: 5.0000 mg | ORAL_TABLET | Freq: Every day | ORAL | Status: DC

## 2019-08-22 MED ORDER — FLEET ENEMA 7-19 GM/118ML RE ENEM: 1.0000 | ENEMA | Freq: Once | RECTAL | Status: DC | PRN

## 2019-08-22 MED ORDER — SODIUM CHLORIDE 0.9 % IV SOLN
INTRAVENOUS | Status: DC | PRN
  Administered 2019-08-22: 500 mL

## 2019-08-22 MED ORDER — DEXAMETHASONE SODIUM PHOSPHATE 10 MG/ML IJ SOLN
INTRAMUSCULAR | Status: DC | PRN
Start: 2019-08-22 — End: 2019-08-22
  Administered 2019-08-22: 5 mg via INTRAVENOUS

## 2019-08-22 MED ORDER — LIDOCAINE HCL (CARDIAC) PF 100 MG/5ML IV SOSY
PREFILLED_SYRINGE | INTRAVENOUS | Status: DC | PRN
  Administered 2019-08-22: 80 mg via INTRATRACHEAL

## 2019-08-22 MED ORDER — LOSARTAN POTASSIUM 50 MG PO TABS
100.0000 mg | ORAL_TABLET | Freq: Every evening | ORAL | Status: DC
  Administered 2019-08-22: 100 mg via ORAL
  Filled 2019-08-22: qty 2

## 2019-08-22 MED ORDER — CEFAZOLIN SODIUM-DEXTROSE 2-4 GM/100ML-% IV SOLN
2.0000 g | Freq: Three times a day (TID) | INTRAVENOUS | Status: AC
  Administered 2019-08-22 – 2019-08-23 (×2): 2 g via INTRAVENOUS
  Filled 2019-08-22 (×2): qty 100

## 2019-08-22 MED ORDER — SODIUM CHLORIDE 0.9 % IV SOLN
INTRAVENOUS | Status: AC
  Filled 2019-08-22: qty 1.2

## 2019-08-22 MED ORDER — EPHEDRINE SULFATE 50 MG/ML IJ SOLN
INTRAMUSCULAR | Status: DC | PRN
Start: 2019-08-22 — End: 2019-08-22
  Administered 2019-08-22 (×2): 5 mg via INTRAVENOUS

## 2019-08-22 MED ORDER — ROCURONIUM BROMIDE 10 MG/ML (PF) SYRINGE
PREFILLED_SYRINGE | INTRAVENOUS | Status: AC
  Filled 2019-08-22: qty 10

## 2019-08-22 MED ORDER — ORAL CARE MOUTH RINSE: 15.0000 mL | Freq: Once | OROMUCOSAL | Status: AC

## 2019-08-22 MED ORDER — FENTANYL CITRATE (PF) 250 MCG/5ML IJ SOLN
INTRAMUSCULAR | Status: AC
  Filled 2019-08-22: qty 5

## 2019-08-22 SURGICAL SUPPLY — 46 items
CANISTER SUCT 3000ML PPV (MISCELLANEOUS) ×2 IMPLANT
CATH ROBINSON RED A/P 18FR (CATHETERS) ×2 IMPLANT
CATH SUCT 10FR WHISTLE TIP (CATHETERS) ×2 IMPLANT
CLIP VESOCCLUDE MED 6/CT (CLIP) ×2 IMPLANT
CLIP VESOCCLUDE SM WIDE 6/CT (CLIP) ×2 IMPLANT
COVER PROBE W GEL 5X96 (DRAPES) ×2 IMPLANT
COVER WAND RF STERILE (DRAPES) IMPLANT
DERMABOND ADVANCED (GAUZE/BANDAGES/DRESSINGS) ×1
DERMABOND ADVANCED .7 DNX12 (GAUZE/BANDAGES/DRESSINGS) ×1 IMPLANT
DRAIN CHANNEL 15F RND FF W/TCR (WOUND CARE) IMPLANT
ELECT REM PT RETURN 9FT ADLT (ELECTROSURGICAL) ×2
ELECTRODE REM PT RTRN 9FT ADLT (ELECTROSURGICAL) ×1 IMPLANT
EVACUATOR SILICONE 100CC (DRAIN) IMPLANT
GLOVE BIOGEL PI IND STRL 7.5 (GLOVE) ×1 IMPLANT
GLOVE BIOGEL PI INDICATOR 7.5 (GLOVE) ×1
GLOVE SURG SS PI 7.5 STRL IVOR (GLOVE) ×2 IMPLANT
GOWN STRL REUS W/ TWL LRG LVL3 (GOWN DISPOSABLE) ×3 IMPLANT
GOWN STRL REUS W/ TWL XL LVL3 (GOWN DISPOSABLE) ×1 IMPLANT
GOWN STRL REUS W/TWL LRG LVL3 (GOWN DISPOSABLE) ×3
GOWN STRL REUS W/TWL XL LVL3 (GOWN DISPOSABLE) ×1
HEMOSTAT SNOW SURGICEL 2X4 (HEMOSTASIS) ×2 IMPLANT
INSERT FOGARTY SM (MISCELLANEOUS) IMPLANT
KIT BASIN OR (CUSTOM PROCEDURE TRAY) ×2 IMPLANT
KIT SHUNT ARGYLE CAROTID ART 6 (VASCULAR PRODUCTS) IMPLANT
KIT TURNOVER KIT B (KITS) ×2 IMPLANT
NEEDLE HYPO 25GX1X1/2 BEV (NEEDLE) IMPLANT
NS IRRIG 1000ML POUR BTL (IV SOLUTION) ×6 IMPLANT
PACK CAROTID (CUSTOM PROCEDURE TRAY) ×2 IMPLANT
PAD ARMBOARD 7.5X6 YLW CONV (MISCELLANEOUS) IMPLANT
PATCH VASC XENOSURE 1CMX6CM (Vascular Products) ×1 IMPLANT
PATCH VASC XENOSURE 1X6 (Vascular Products) ×1 IMPLANT
POSITIONER HEAD DONUT 9IN (MISCELLANEOUS) ×2 IMPLANT
SET WALTER ACTIVATION W/DRAPE (SET/KITS/TRAYS/PACK) IMPLANT
SHUNT CAROTID BYPASS 10 (VASCULAR PRODUCTS) IMPLANT
SHUNT CAROTID BYPASS 12FRX15.5 (VASCULAR PRODUCTS) IMPLANT
SUT ETHILON 3 0 PS 1 (SUTURE) IMPLANT
SUT PROLENE 6 0 BV (SUTURE) ×4 IMPLANT
SUT SILK 3 0 (SUTURE)
SUT SILK 3-0 18XBRD TIE 12 (SUTURE) IMPLANT
SUT VIC AB 3-0 SH 27 (SUTURE) ×2
SUT VIC AB 3-0 SH 27X BRD (SUTURE) ×2 IMPLANT
SUT VIC AB 3-0 X1 27 (SUTURE) ×2 IMPLANT
SYR BULB IRRIG 60ML STRL (SYRINGE) ×2 IMPLANT
SYR CONTROL 10ML LL (SYRINGE) IMPLANT
TOWEL GREEN STERILE (TOWEL DISPOSABLE) ×2 IMPLANT
WATER STERILE IRR 1000ML POUR (IV SOLUTION) ×2 IMPLANT

## 2019-08-22 NOTE — Anesthesia Preprocedure Evaluation (Signed)
Anesthesia Evaluation  Patient identified by MRN, date of birth, ID band Patient awake    Reviewed: Allergy & Precautions, NPO status , Patient's Chart, lab work & pertinent test results  Airway Mallampati: II  TM Distance: >3 FB Neck ROM: Full    Dental no notable dental hx.    Pulmonary neg pulmonary ROS, former smoker,    Pulmonary exam normal breath sounds clear to auscultation       Cardiovascular hypertension, Pt. on medications Normal cardiovascular exam Rhythm:Regular Rate:Normal     Neuro/Psych CVA negative psych ROS   GI/Hepatic negative GI ROS, Neg liver ROS,   Endo/Other  negative endocrine ROS  Renal/GU negative Renal ROS  negative genitourinary   Musculoskeletal negative musculoskeletal ROS (+)   Abdominal   Peds negative pediatric ROS (+)  Hematology negative hematology ROS (+)   Anesthesia Other Findings   Reproductive/Obstetrics negative OB ROS                             Anesthesia Physical Anesthesia Plan  ASA: III  Anesthesia Plan: General   Post-op Pain Management:    Induction: Intravenous  PONV Risk Score and Plan: 2 and Ondansetron, Dexamethasone and Treatment may vary due to age or medical condition  Airway Management Planned: Oral ETT  Additional Equipment: Arterial line  Intra-op Plan:   Post-operative Plan: Extubation in OR  Informed Consent: I have reviewed the patients History and Physical, chart, labs and discussed the procedure including the risks, benefits and alternatives for the proposed anesthesia with the patient or authorized representative who has indicated his/her understanding and acceptance.     Dental advisory given  Plan Discussed with: CRNA and Surgeon  Anesthesia Plan Comments:         Anesthesia Quick Evaluation

## 2019-08-22 NOTE — Progress Notes (Signed)
Pt received from PACU to 4e16. Oriented to room and call bell. CHG bath complete. CCMD called, VSS. Neuro intact. Call bell in reach. Will continue to monitor.  Hazle Nordmann, RN

## 2019-08-22 NOTE — Op Note (Signed)
Patient name: Jonathan Savage MRN: [id number] DOB: [birth year] Sex: male  08/22/2019 Pre-operative Diagnosis: Asymptomatic   left carotid stenosis Post-operative diagnosis:  Same Surgeon:  Durene Cal Assistants:  Clinton Gallant, PA Procedure:    left carotid Endarterectomy with bovine pericardial patch angioplasty Anesthesia:  General Blood Loss:  100 Specimens:  none  Findings:  85 %stenosis; Thrombus:  none  Indications:  79 year old with asymptomatic >80% carotid stenosis on the left  Procedure:  The patient was identified in the holding area and taken to [facility] OR ROOM 11  The patient was then placed supine on the table.   General endotrachial anesthesia was administered.  The patient was prepped and draped in the usual sterile fashion.  A time out was called and antibiotics were administered.  A PA was necessary for suction, retraction, and exposure. The incision was made along the anterior border of the left sternocleidomastoid muscle.  Cautery was used to dissect through the subcutaneous tissue.  The platysma muscle was divided with cautery.  The internal jugular vein was exposed along its anterior medial border.  The common facial vein was exposed and then divided between 2-0 silk ties and metal clips.  The common carotid artery was then circumferentially exposed and encircled with an umbilical tape.  The vagus nerve was identified and protected.  Next sharp dissection was used to expose the external carotid artery and the superior thyroid artery.  The were encircled with a blue vessel loop and a 2-0 silk tie respectively.  Finally, the internal carotid was carefully dissected free.  An umbilical tape was placed around the internal carotid artery distal to the diseased segment.  The hypoglossal nerve was visualized throughout and protected.  The patient was given systemic heparinization.  A bovine carotid patch was selected and prepared on the back table.  A 10 french shunt was also prepared.   After blood pressure readings were appropriate and the heparin had been given time to circulate, the internal carotid artery was occluded with a baby Gregory clamp.  The external and common carotid arteries were then occluded with vascular clamps and the 2-0 tie tightened on the superior thyroid artery.  A #11 blade was used to make an arteriotomy in the common carotid artery.  This was extended with Potts scissors along the anterior and lateral border of the common and internal carotid artery.  Approximately 85% stenosis was identified.  There was no thrombus identified.  The 10 french shunt was not placed, as there was excellent backbleeding.  A kleiner kuntz elevator was used to perform endarterectomy.  An eversion endarterectomy was performed in the external carotid artery.  A good distal endpoint was obtained in the internal carotid artery.  The specimen was removed and sent to pathology.  Heparinized saline was used to irrigate the endarterectomized field.  All potential embolic debris was removed.  Bovine pericardial patch angioplasty was then performed using a running 6-0 Prolene. The common internal and external carotid arteries were all appropriately flushed. The artery was again irrigated with heparin saline.  The anastomosis was then secured. The clamp was first released on the external carotid artery followed by the common carotid artery approximately 30 seconds later, bloodflow was reestablish through the internal carotid artery.  Next, a hand-held  Doppler was used to evaluate the signals in the common, external, and internal  carotid arteries, all of which had appropriate signals. I then administered  50 mg protamine. The wound was then irrigated.  After  hemostasis was achieved, the carotid sheath was reapproximated with 3-0 Vicryl. The  platysma muscle was reapproximated with running 3-0 Vicryl. The skin  was closed with 4-0 Vicryl. Dermabond was placed on the skin. The  patient was then  successfully extubated. His neurologic exam was  similar to his preprocedural exam. The patient was then taken to recovery room  in stable condition. There were no complications.     Disposition:  To PACU in stable condition.  Relevant Operative Details:  Externally rotated ECA which required ligation of the inferior thyroid for exposure.  I could not advance a shunt distally, and so I did not utilize one since he had excellent back-bleeding.  He had 85% stenosis in the ICA with mobile debris.  The ICA was opened for about 3 cm to remove all of the plaque  V. Durene Cal, M.D., [facility] [place] Office: [phone] Pager:  [phone]

## 2019-08-22 NOTE — Interval H&P Note (Signed)
History and Physical Interval Note:  08/22/2019 10:57 AM  Jonathan Savage  has presented today for surgery, with the diagnosis of LEFT CAROTID STENOSIS.  The various methods of treatment have been discussed with the patient and family. After consideration of risks, benefits and other options for treatment, the patient has consented to  Procedure(s): LEFT CAROTID ENDARTERECTOMY WITH BOVIE PATCH ANGIOPLASTY (Left) as a surgical intervention.  The patient's history has been reviewed, patient examined, no change in status, stable for surgery.  I have reviewed the patient's chart and labs.  Questions were answered to the patient's satisfaction.     Durene Cal

## 2019-08-22 NOTE — Progress Notes (Addendum)
    Stable post op disposition No tongue deviation, no facial droop Moving all 4 ext Left neck incision without hematoma  S/P left CEA  Waiting for bed placement.  Mosetta Pigeon PA-C    Stable, s/p Left CEA.  Neuro intact   Wells Kelce Bouton

## 2019-08-22 NOTE — Transfer of Care (Signed)
Immediate Anesthesia Transfer of Care Note  Patient: Jonathan Savage  Procedure(s) Performed: LEFT CAROTID ENDARTERECTOMY WITH BOVIE PATCH ANGIOPLASTY (Left Neck)  Patient Location: PACU  Anesthesia Type:General  Level of Consciousness: awake, alert  and oriented  Airway & Oxygen Therapy: Patient Spontanous Breathing  Post-op Assessment: Report given to RN, Post -op Vital signs reviewed and stable, Patient moving all extremities X 4 and Patient able to stick tongue midline  Post vital signs: Reviewed and stable  Last Vitals:  Vitals Value Taken Time  BP 149/78 08/22/19 1113  Temp    Pulse 77 08/22/19 1122  Resp 16 08/22/19 1122  SpO2 95 % 08/22/19 1122  Vitals shown include unvalidated device data.  Last Pain:  Vitals:   08/22/19 0728  TempSrc: Oral  PainSc:          Complications: No complications documented.

## 2019-08-22 NOTE — Anesthesia Procedure Notes (Signed)
Arterial Line Insertion Start/End8/18/2021 8:00 AM, 08/22/2019 8:15 AM Performed by: Jodell Cipro, CRNA, CRNA  Patient location: Pre-op. Preanesthetic checklist: patient identified, IV checked, site marked, risks and benefits discussed, surgical consent, monitors and equipment checked, pre-op evaluation, timeout performed and anesthesia consent Lidocaine 1% used for infiltration Left, radial was placed Catheter size: 20 G Hand hygiene performed  and maximum sterile barriers used  Allen's test indicative of satisfactory collateral circulation Attempts: 1 Procedure performed without using ultrasound guided technique. Following insertion, dressing applied and Biopatch. Post procedure assessment: normal  Patient tolerated the procedure well with no immediate complications.

## 2019-08-22 NOTE — Anesthesia Procedure Notes (Signed)
Procedure Name: Intubation Date/Time: 08/22/2019 8:37 AM Performed by: Leonor Liv, CRNA Pre-anesthesia Checklist: Patient identified, Emergency Drugs available, Suction available and Patient being monitored Patient Re-evaluated:Patient Re-evaluated prior to induction Oxygen Delivery Method: Circle System Utilized Preoxygenation: Pre-oxygenation with 100% oxygen Induction Type: IV induction Ventilation: Mask ventilation without difficulty Laryngoscope Size: Mac and 3 Grade View: Grade II Tube type: Oral Tube size: 7.5 mm Number of attempts: 1 Airway Equipment and Method: Stylet and Oral airway Placement Confirmation: ETT inserted through vocal cords under direct vision,  positive ETCO2 and breath sounds checked- equal and bilateral Secured at: 22 cm Tube secured with: Tape Dental Injury: Teeth and Oropharynx as per pre-operative assessment

## 2019-08-22 NOTE — Anesthesia Postprocedure Evaluation (Signed)
Anesthesia Post Note  Patient: Jonathan Savage  Procedure(s) Performed: LEFT CAROTID ENDARTERECTOMY WITH BOVIE PATCH ANGIOPLASTY (Left Neck)     Patient location during evaluation: PACU Anesthesia Type: General Level of consciousness: awake and alert Pain management: pain level controlled Vital Signs Assessment: post-procedure vital signs reviewed and stable Respiratory status: spontaneous breathing, nonlabored ventilation, respiratory function stable and patient connected to nasal cannula oxygen Cardiovascular status: blood pressure returned to baseline and stable Postop Assessment: no apparent nausea or vomiting Anesthetic complications: no   No complications documented.  Last Vitals:  Vitals:   08/22/19 1400 08/22/19 1430  BP:    Pulse: 75 79  Resp: 16 11  Temp:    SpO2: 97% 96%    Last Pain:  Vitals:   08/22/19 1430  TempSrc:   PainSc: 0-No pain                 Kili Gracy S

## 2019-08-22 NOTE — Discharge Instructions (Signed)
   [facility]  Discharge Instructions   Carotid Endarterectomy (CEA)  Please refer to the following instructions for your post-procedure care. Your surgeon or physician assistant will discuss any changes with you.  Activity  You are encouraged to walk as much as you can. You can slowly return to normal activities but must avoid strenuous activity and heavy lifting until your doctor tell you it's OK. Avoid activities such as vacuuming or swinging a golf club. You can drive after one week if you are comfortable and you are no longer taking prescription pain medications. It is normal to feel tired for serval weeks after your surgery. It is also normal to have difficulty with sleep habits, eating, and bowel movements after surgery. These will go away with time.  Bathing/Showering  You may shower after you come home. Do not soak in a bathtub, hot tub, or swim until the incision heals completely.  Incision Care  Shower every day. Clean your incision with mild soap and water. Pat the area dry with a clean towel. You do not need a bandage unless otherwise instructed. Do not apply any ointments or creams to your incision. You may have skin glue on your incision. Do not peel it off. It will come off on its own in about one week. Your incision may feel thickened and raised for several weeks after your surgery. This is normal and the skin will soften over time. For Men Only: It's OK to shave around the incision but do not shave the incision itself for 2 weeks. It is common to have numbness under your chin that could last for several months.  Diet  Resume your normal diet. There are no special food restrictions following this procedure. A low fat/low cholesterol diet is recommended for all patients with vascular disease. In order to heal from your surgery, it is CRITICAL to get adequate nutrition. Your body requires vitamins, minerals, and protein. Vegetables are the best  source of vitamins and minerals. Vegetables also provide the perfect balance of protein. Processed food has little nutritional value, so try to avoid this.        Medications  Resume taking all of your medications unless your doctor or physician assistant tells you not to. If your incision is causing pain, you may take over-the- counter pain relievers such as acetaminophen (Tylenol). If you were prescribed a stronger pain medication, please be aware these medications can cause nausea and constipation. Prevent nausea by taking the medication with a snack or meal. Avoid constipation by drinking plenty of fluids and eating foods with a high amount of fiber, such as fruits, vegetables, and grains. Do not take Tylenol if you are taking prescription pain medications.  Follow Up  Our office will schedule a follow up appointment 2-3 weeks following discharge.  Please call us immediately for any of the following conditions  Increased pain, redness, drainage (pus) from your incision site. Fever of 101 degrees or higher. If you should develop stroke (slurred speech, difficulty swallowing, weakness on one side of your body, loss of vision) you should call 911 and go to the nearest emergency room.  Reduce your risk of vascular disease:  Stop smoking. If you would like help call [facility] at 1-800-QUIT-NOW (1-800-784-8669) or [place] at 336-586-4000. Manage your cholesterol Maintain a desired weight Control your diabetes Keep your blood pressure down  If you have any questions, please call the office at [phone].   

## 2019-08-23 ENCOUNTER — Encounter (HOSPITAL_COMMUNITY): Payer: Self-pay | Admitting: Surgery

## 2019-08-23 DIAGNOSIS — R55 Syncope and collapse: Secondary | ICD-10-CM

## 2019-08-23 LAB — BASIC METABOLIC PANEL
Anion gap: 8 (ref 5–15)
BUN: 17 mg/dL (ref 8–23)
CO2: 25 mmol/L (ref 22–32)
Calcium: 9.2 mg/dL (ref 8.9–10.3)
Chloride: 105 mmol/L (ref 98–111)
Creatinine, Ser: 1.28 mg/dL — ABNORMAL HIGH (ref 0.61–1.24)
GFR calc Af Amer: >60 mL/min (ref >60)
GFR calc non Af Amer: 53 mL/min — ABNORMAL LOW (ref >60)
Glucose, Bld: 152 mg/dL — ABNORMAL HIGH (ref 70–99)
Potassium: 4.3 mmol/L (ref 3.5–5.1)
Sodium: 138 mmol/L (ref 135–145)

## 2019-08-23 LAB — CBC
HCT: 37.8 % — ABNORMAL LOW (ref 39.0–52.0)
Hemoglobin: 12.2 g/dL — ABNORMAL LOW (ref 13.0–17.0)
MCH: 28.8 pg (ref 26.0–34.0)
MCHC: 32.3 g/dL (ref 30.0–36.0)
MCV: 89.2 fL (ref 80.0–100.0)
Platelets: 272 10*3/uL (ref 150–400)
RBC: 4.24 MIL/uL (ref 4.22–5.81)
RDW: 13.3 % (ref 11.5–15.5)
WBC: 14.3 10*3/uL — ABNORMAL HIGH (ref 4.0–10.5)
nRBC: 0 % (ref 0.0–0.2)

## 2019-08-23 MED ORDER — OXYCODONE-ACETAMINOPHEN 5-325 MG PO TABS: 1.0000 | ORAL_TABLET | Freq: Four times a day (QID) | ORAL | 0 refills | Status: DC | PRN

## 2019-08-23 NOTE — Progress Notes (Signed)
Pt discharged home with family. IV and telemetry box removed. Pt and pt's daughter received discharge instructions and all questions were answered. Pt left with all of his belongings. Pt discharged via wheelchair and was accompanied by a Ferne Coe and pt's daughter.

## 2019-08-23 NOTE — Consult Note (Signed)
Cardiology Consultation:   Patient ID: Jonathan Savage MRN: [id number]; DOB: 06/24/1940  Admit date: 08/22/2019 Date of Consult: 08/23/2019  Primary Care Provider: Farris Has, MD Jonathan Savage Cardiologist: Nanetta Batty, MD  [facility] [place] [facility] Electrophysiologist:  None    Patient Profile:   Jonathan Savage is a 79 y.o. 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 Myra Gianotti.  History of Present Illness:   Jonathan Savage is followed by Dr. Allyson Sabal for the above cardiac issues. Patient was seen earlier this year and cortid bruit was heard on exam. Carotid dopplers were obtained 06/07/19 showing high-grade let ICA stenosis. He was referred to Dr. Myra Gianotti for further evaluation. A Myoview was ordered in anticipation of possible surgery. The myoview stress test was low risk without evidence of ischemia. The patient was seen by Dr. Myra Gianotti 08/06/19 and patient was set up for endarterectomy which he underwent 08/21/19. Post-op day one the patient experienced was noted to be bradycardic and hypotensive and cardiology was consulted.  Patient takes amlodipine and losartan for blood pressures.  On my interview the patient said he was feeling good this morning, did not sleep much. He ate his breakfast and was going to get up but started feeling dizzy, lightheaded and felt nauseas. He waited on the edge of the bed and took deeb breaths which seemed to help. Symptoms only lasted 5 minutes. BP came back at 100/60 and pulse dropped to the 50s. Patient is now feeling back to normal. BP/HR have improved.    Labs: Potassium 4.3, Sodium 138 Creatinine 1.28, BUN 17 WBC 14.3, Hgb 12.2    Past Medical History:  Diagnosis Date  . Complication of anesthesia    pt states following last surgery he was awake for over 24 hours following surgery  . Hypertension   . Stroke Jonathan Savage)     Past Surgical  History:  Procedure Laterality Date  . LAPAROSCOPIC NEPHRECTOMY Right   . NECK SURGERY    . PROSTATE SURGERY       Home Medications:  Prior to Admission medications   Medication Sig Start Date End Date Taking? Authorizing Provider  amLODipine (NORVASC) 2.5 MG tablet Take 3 tablets (7.5 mg total) by mouth daily. Patient taking differently: Take 2.5 mg by mouth daily.  07/19/19 09/17/19 Yes Runell Gess, MD  amLODipine (NORVASC) 5 MG tablet Take 5 mg by mouth daily.   Yes [provider]  aspirin EC 81 MG tablet Take 81 mg by mouth daily.   Yes [provider]  losartan (COZAAR) 100 MG tablet Take 100 mg by mouth every evening.  01/19/19  Yes [provider]  rosuvastatin (CRESTOR) 5 MG tablet Take 1 tablet (5 mg total) by mouth daily. 08/06/19  Yes Nada Libman, MD  acetaminophen (TYLENOL) 500 MG tablet Take 1,000 mg by mouth every 8 (eight) hours as needed for mild pain. Patient not taking: Reported on 08/09/2019    [provider]  oxyCODONE-acetaminophen (PERCOCET/ROXICET) 5-325 MG tablet Take 1 tablet by mouth every 6 (six) hours as needed for moderate pain. 08/23/19   Lars Mage, PA-C    Inpatient Medications: Scheduled Meds: . amLODipine  7.5 mg Oral Daily  . aspirin EC  81 mg Oral Daily  . docusate sodium  100 mg Oral Daily  . heparin  5,000 Units Subcutaneous Q8H  . losartan  100  mg Oral QPM  . pantoprazole  40 mg Oral Daily  . rosuvastatin  5 mg Oral Daily   Continuous Infusions: . sodium chloride    . sodium chloride Stopped (08/23/19 0350)  . magnesium sulfate bolus IVPB     PRN Meds: sodium chloride, acetaminophen **OR** acetaminophen, alum & mag hydroxide-simeth, bisacodyl, guaiFENesin-dextromethorphan, hydrALAZINE, HYDROmorphone (DILAUDID) injection, labetalol, magnesium sulfate bolus IVPB, metoprolol tartrate, ondansetron, oxyCODONE-acetaminophen, phenol, potassium chloride, senna-docusate, sodium phosphate  Allergies:    No Known Allergies  Social History:   Social History   Socioeconomic History  . Marital status: Widowed    Spouse name: Not on file  . Number of children: Not on file  . Years of education: Not on file  . Highest education level: Not on file  Occupational History  . Not on file  Tobacco Use  . Smoking status: Former Games developer  . Smokeless tobacco: Never Used  Vaping Use  . Vaping Use: Never used  Substance and Sexual Activity  . Alcohol use: Never  . Drug use: Never  . Sexual activity: Not on file  Other Topics Concern  . Not on file  Social History Narrative  . Not on file   Social Determinants of Health   Financial Resource Strain:   . Difficulty of Paying Living Expenses: Not on file  Food Insecurity:   . Worried About Programme researcher, broadcasting/film/video in the Last Year: Not on file  . Ran Out of Food in the Last Year: Not on file  Transportation Needs:   . Lack of Transportation (Medical): Not on file  . Lack of Transportation (Non-Medical): Not on file  Physical Activity:   . Days of Exercise per Week: Not on file  . Minutes of Exercise per Session: Not on file  Stress:   . Feeling of Stress : Not on file  Social Connections:   . Frequency of Communication with Friends and Family: Not on file  . Frequency of Social Gatherings with Friends and Family: Not on file  . Attends Religious Services: Not on file  . Active Member of Clubs or Organizations: Not on file  . Attends Banker Meetings: Not on file  . Marital Status: Not on file  Intimate Partner Violence:   . Fear of Current or Ex-Partner: Not on file  . Emotionally Abused: Not on file  . Physically Abused: Not on file  . Sexually Abused: Not on file    Family History:   *History reviewed. No pertinent family history.   ROS:  Please see the history of present illness.  All other ROS reviewed and negative.     Physical Exam/Data:   Vitals:   08/23/19 0445 08/23/19 0806 08/23/19 0846 08/23/19 0917   BP: 139/73 (!) 149/73  100/60  Pulse: 64 69 69 (!) 52  Resp: 17 18 16 16   Temp: 98.7 F (37.1 C) 97.6 F (36.4 C) 97.7 F (36.5 C)   TempSrc: Oral Oral Oral   SpO2:  96% 97%   Weight:      Height:        Intake/Output Summary (Last 24 hours) at 08/23/2019 1009 Last data filed at 08/23/2019 0400 Gross per 24 hour  Intake 1857.65 ml  Output 50 ml  Net 1807.65 ml   Last 3 Weights 08/22/2019 08/16/2019 08/06/2019  Weight (lbs) 141 lb 143 lb 8 oz 145 lb  Weight (kg) 63.957 kg 65.091 kg 65.772 kg     Body mass index is 22.08 kg/m.  General:  Well nourished, well developed, in no acute distress HEENT: normal Lymph: no adenopathy Neck: no JVD Endocrine:  No thryomegaly Vascular: No carotid bruits; FA pulses 2+ bilaterally without bruits  Cardiac:  normal S1, S2; RRR; no murmur  Lungs:  clear to auscultation bilaterally, no wheezing, rhonchi or rales  Abd: soft, nontender, no hepatomegaly  Ext: no edema Musculoskeletal:  No deformities, BUE and BLE strength normal and equal Skin: warm and dry  Neuro:  CNs 2-12 intact, no focal abnormalities noted Psych:  Normal affect   EKG:  The EKG was personally reviewed and demonstrates:  pending Telemetry:  Telemetry was personally reviewed and demonstrates:  NSR, HR generally 60-70. Brief drop to 50s upper 40s  Relevant CV Studies:  Myoview stress test 06/21/19  Horizontal ST segment depression of 1 mm was noted during stress in the II, III and aVF leads.  Nuclear stress EF: 55%.  The left ventricular ejection fraction is normal (55-65%).  Defect 1: There is a medium defect of mild severity present in the mid anteroseptal, apical septal and apex location.  Defect 2: There is a medium defect of mild severity present in the basal inferior, mid inferior and apical inferior location.  Findings consistent with prior myocardial infarction versus artifact. Suspect artifact given normal wall motion in these areas  This is a low risk  study.   Fixed perfusion defects in anteroseptum, apex, and inferior walls, with normal wall motion in these regions, suggestive of artifact.     Laboratory Data:  High Sensitivity Troponin:  No results for input(s): TROPONINIHS in the last 720 hours.   Chemistry Recent Labs  Lab 08/23/19 0245  NA 138  K 4.3  CL 105  CO2 25  GLUCOSE 152*  BUN 17  CREATININE 1.28*  CALCIUM 9.2  GFRNONAA 53*  GFRAA >60  ANIONGAP 8    No results for input(s): PROT, ALBUMIN, AST, ALT, ALKPHOS, BILITOT in the last 168 hours. Hematology Recent Labs  Lab 08/23/19 0245  WBC 14.3*  RBC 4.24  HGB 12.2*  HCT 37.8*  MCV 89.2  MCH 28.8  MCHC 32.3  RDW 13.3  PLT 272   BNPNo results for input(s): BNP, PROBNP in the last 168 hours.  DDimer No results for input(s): DDIMER in the last 168 hours.   Radiology/Studies:  No results found.   Assessment and Plan:   Presyncope - s/p L CEA post op day 1 developed lightheadedness, dizziness, nauseua trying to get up form the bed.  Bps found to be soft and HR into the 50s, which have since improved - possible vasovagal episode - He is not on rate lowering meds - Pta amlodipine and losartan, which he takes at night - labs appear otherwise stable - Patient currently feeling back to normal - Will check an EKG and orthostatics - I think it is okay for the patient to be discharged. Suspect this is reaction to recent stress of the procedure. Recommended continual monitoring of symptoms and Bps. Can follow-up with cards OP.   For questions or updates, please contact [facility] Please consult [email or web address] for contact info under    Signed, Shristi Scheib David Stall, PA-C  08/23/2019 10:09 AM

## 2019-08-23 NOTE — Progress Notes (Signed)
Patient questioning need for further EKG and orthostatics prior to discharge. Dr. Scharlene Gloss made aware and stated he would like EKG prior to discharge. Patient made aware, and agreeable to EKG. EKG obtained as ordered. Rayshun Kandler, Randall An RN

## 2019-08-23 NOTE — Progress Notes (Addendum)
[facility]  Subjective  - Doing well without any neurologic changes.   Objective 139/73 64 98.7 F (37.1 C) (Oral) 17 96%  Intake/Output Summary (Last 24 hours) at 08/23/2019 0716 Last data filed at 08/23/2019 0400 Gross per 24 hour  Intake 1857.65 ml  Output 50 ml  Net 1807.65 ml    Left neck incision healing well without hematoma No tongue deviation or facial droop Moving all 4 ext. Speech is clear Lungs non labored breathing  Assessment/Planning: POD # 1 left CEA  Sore throat, but swallowing fine Ambulating and voided Plan for discharge home today f/u in 2-3 weeks with Dr. Waneta Martins 08/23/2019 7:16 AM --  Laboratory Lab Results: Recent Labs    08/23/19 0245  WBC 14.3*  HGB 12.2*  HCT 37.8*  PLT 272   BMET Recent Labs    08/23/19 0245  NA 138  K 4.3  CL 105  CO2 25  GLUCOSE 152*  BUN 17  CREATININE 1.28*  CALCIUM 9.2    COAG Lab Results  Component Value Date   INR 1.0 08/16/2019   No results found for: PTT  Vagal vs near syncopal episode this am with HR in 40's.  Patient describes 2 similar episodes.  I will ask cardiology to evaluate.  Pending the results of their work up, he can be discharged.   Durene Cal

## 2019-08-24 ENCOUNTER — Telehealth: Payer: Self-pay | Admitting: Cardiovascular Disease

## 2019-08-24 NOTE — Telephone Encounter (Signed)
Patient's daughter, Rosey Bath, is calling to confirm with a pharmacist how the patient needs to be taking the amLODipine (NORVASC) 2.5 MG tablet. Please advise.

## 2019-08-24 NOTE — Telephone Encounter (Signed)
Patient's daughter called stating patient does not have the 5mg  tablets. He takes the 2.5mg  tablets 3 of them once daily. I have updated chart to reflect this.

## 2019-08-27 ENCOUNTER — Encounter: Payer: Medicare Other | Admitting: Physical Therapy

## 2019-08-27 NOTE — Discharge Summary (Signed)
[facility] Discharge Summary   Patient ID:  Jonathan Savage MRN: [id number] DOB/AGE: 06-02-1940 79 y.o.  Admit date: 08/22/2019 Discharge date: 08/23/19 Date of Surgery: 08/22/2019 Surgeon: Surgeon(s): Nada Libman, MD  Admission Diagnosis: Carotid stenosis, asymptomatic [I65.29]  Discharge Diagnoses:  Carotid stenosis, asymptomatic [I65.29]  Secondary Diagnoses: Past Medical History:  Diagnosis Date  . Complication of anesthesia    pt states following last surgery he was awake for over 24 hours following surgery  . Hypertension   . Stroke [facility])     Procedure(s): LEFT CAROTID ENDARTERECTOMY WITH BOVIE PATCH ANGIOPLASTY  Discharged Condition: stable  HPI:  Jonathan Savage is a 79 y.o. male, who was found to have a bruit on and exam which led to carotid Dopplers showing a high-grade left carotid stenosis.  He is asymptomatic.  Specifically he denies any new issues with motor or sensory function in his extremities.  He denies slurred speech or amaurosis fugax.  Asymptomatic Left Carotid: Velocities in the left ICA are consistent with a 80-99% stenosis.   He was scheduled for left CEA.   Hospital Course:  Jonathan Savage is a 79 y.o. male is S/P Procedure(s): LEFT CAROTID ENDARTERECTOMY WITH BOVIE PATCH ANGIOPLASTY   Post op course he denise aphasia, amaurosis or weakness on one side of his body.  He did have an episode of Vagal vs near syncopal.  Cardiology was asked to see him.   A/P: Pre-Syncope: He reports he became nauseated this morning while sitting in the bed.  He then felt lightheaded and dizzy.  His blood pressure was noted to be low with heart rate in the 50s.  He did not pass out.  This appears to be a vasovagal episode that did not result in syncope.  He is recently had left carotid endarterectomy procedure and I suspect his nervous system is a bit hypervigilant.  His murmur is normal.  He reports no chest pain or trouble breathing.  He  had a stress test prior to admission that was normal.  I recommend no further testing.  I did instruct him that he may be prone to these episodes.  He should sit down or lay down immediately if he feels a similar episode coming on to avoid any falls.  He may follow-up with his primary cardiologist as planned.   Patient discharged in stable condition.     Consults:  Treatment Team:  Lbcardiology, Rounding, MD  Significant Diagnostic Studies: CBC Lab Results  Component Value Date   WBC 14.3 (H) 08/23/2019   HGB 12.2 (L) 08/23/2019   HCT 37.8 (L) 08/23/2019   MCV 89.2 08/23/2019   PLT 272 08/23/2019    BMET    Component Value Date/Time   NA 138 08/23/2019 0245   K 4.3 08/23/2019 0245   CL 105 08/23/2019 0245   CO2 25 08/23/2019 0245   GLUCOSE 152 (H) 08/23/2019 0245   BUN 17 08/23/2019 0245   CREATININE 1.28 (H) 08/23/2019 0245   CALCIUM 9.2 08/23/2019 0245   GFRNONAA 53 (L) 08/23/2019 0245   GFRAA >60 08/23/2019 0245   COAG Lab Results  Component Value Date   INR 1.0 08/16/2019     Disposition:  Discharge to :Home Discharge Instructions    Call MD for:  redness, tenderness, or signs of infection (pain, swelling, bleeding, redness, odor or green/yellow discharge around incision site)   Complete by: As directed    Call MD for:  severe or increased pain, loss or decreased  feeling  in affected limb(s)   Complete by: As directed    Call MD for:  temperature >100.5   Complete by: As directed    Discharge instructions   Complete by: As directed    You may shower as needed   Resume previous diet   Complete by: As directed      Allergies as of 08/23/2019   No Known Allergies     Medication List    STOP taking these medications   acetaminophen 500 MG tablet Commonly known as: TYLENOL     TAKE these medications   amLODipine 2.5 MG tablet Commonly known as: NORVASC Take 3 tablets (7.5 mg total) by mouth daily.   aspirin EC 81 MG tablet Take 81 mg by mouth  daily.   losartan 100 MG tablet Commonly known as: COZAAR Take 100 mg by mouth every evening.   oxyCODONE-acetaminophen 5-325 MG tablet Commonly known as: PERCOCET/ROXICET Take 1 tablet by mouth every 6 (six) hours as needed for moderate pain.   rosuvastatin 5 MG tablet Commonly known as: Crestor Take 1 tablet (5 mg total) by mouth daily.      Verbal and written Discharge instructions given to the patient. Wound care per Discharge AVS  Follow-up Information    Nada Libman, MD In 2 weeks.   Specialties: Vascular Surgery, Cardiology Why: Office will call you to arrange your appt (sent) Contact information: [address] [place] [address] [phone]        [facility] Follow up.   Specialty: Home Health Services Why: Preop referral by vascular office for any HH needs- they will follow up post discharge  Contact information: [address] [place] [address] [phone]               Signed: Mosetta Pigeon 08/27/2019, 10:23 AM --- For VQI Registry use --- Instructions: Press F2 to tab through selections.  Delete question if not applicable.   Modified Rankin score at D/C (0-6): Rankin Score=0  IV medication needed for:  1. Hypertension: No 2. Hypotension: No  Post-op Complications: No  1. Post-op CVA or TIA: No  If yes: Event classification (right eye, left eye, right cortical, left cortical, verterobasilar, other):   If yes: Timing of event (intra-op, <6 hrs post-op, >=6 hrs post-op, unknown):   2. CN injury: No  If yes: CN  injuried   3. Myocardial infarction: No  If yes: Dx by (EKG or clinical, Troponin):   4.  CHF: No  5.  Dysrhythmia (new): No  6. Wound infection: No  7. Reperfusion symptoms: No  8. Return to OR: No  If yes: return to OR for (bleeding, neurologic, other CEA incision, other):   Discharge medications: Statin use:  Yes ASA use:  Yes Beta blocker use:  No  for medical reason    ACE-Inhibitor use:  No  for medical reason   P2Y12 Antagonist use: [x ] None, [ ]  Plavix, [ ]  Plasugrel, [ ]  Ticlopinine, [ ]  Ticagrelor, [ ]  Other, [ ]  No for medical reason, [ ]  Non-compliant, [ ]  Not-indicated Anti-coagulant use:  x[ ]  None, [ ]  Warfarin, [ ]  Rivaroxaban, [ ]  Dabigatran, [ ]  Other, [ ]  No for medical reason, [ ]  Non-compliant, [ ]  Not-indicated

## 2019-09-03 ENCOUNTER — Encounter: Payer: Medicare Other | Admitting: Physical Therapy

## 2019-09-17 ENCOUNTER — Encounter: Payer: Medicare Other | Admitting: Surgery

## 2019-09-17 ENCOUNTER — Ambulatory Visit (INDEPENDENT_AMBULATORY_CARE_PROVIDER_SITE_OTHER): Payer: Self-pay | Admitting: Physician Assistant

## 2019-09-17 ENCOUNTER — Other Ambulatory Visit: Payer: Self-pay

## 2019-09-17 DIAGNOSIS — Z9889 Other specified postprocedural states: Secondary | ICD-10-CM

## 2019-09-17 DIAGNOSIS — I6522 Occlusion and stenosis of left carotid artery: Secondary | ICD-10-CM

## 2019-09-17 NOTE — Progress Notes (Signed)
  POST OPERATIVE OFFICE NOTE    CC:  F/u for surgery  HPI:  This is a 79 y.o. male who is s/p left CEA on 08/22/2019 by Dr. Myra Gianotti for asymptomatic carotid stenosis.  The right is 1-39% ICA stenosis on last duplex.   Pt returns today for follow up.  He is doing well.  He denies any amaurosis fugax, facial droop, difficulty swallowing, difficulty with speech or paralysis or numbness of any extremity.  He states that his blood pressure has been good.  He has had some issues with dizziness but feels this is due to medications.    He states that it does feel weird trying to shave around the incision.  He wants to know [facility].      No Known Allergies  Current Outpatient Medications  Medication Sig Dispense Refill  . amLODipine (NORVASC) 2.5 MG tablet Take 3 tablets (7.5 mg total) by mouth daily. 90 tablet 1  . aspirin EC 81 MG tablet Take 81 mg by mouth daily.    Marland Kitchen losartan (COZAAR) 100 MG tablet Take 100 mg by mouth every evening.     Marland Kitchen oxyCODONE-acetaminophen (PERCOCET/ROXICET) 5-325 MG tablet Take 1 tablet by mouth every 6 (six) hours as needed for moderate pain. 10 tablet 0  . rosuvastatin (CRESTOR) 5 MG tablet Take 1 tablet (5 mg total) by mouth daily. 30 tablet 10   No current facility-administered medications for this visit.     ROS:  See HPI  Physical Exam:   Incision:  Well healed. Extremities:  Moving all extremities equally Neuro: in tact; tongue is midline   Assessment/Plan:  This is a 79 y.o. male who is s/p: left CEA on 08/22/2019 by Dr. Myra Gianotti.     -pt is doing well and he is neuro in tact.  Discussed that the healing ridge should smooth out over time.  Also discussed that he will have some numbness around his incision and hopeful this will improve with time.  Instructed him to be careful shaving given the numbness.   -he will f/u in 9 months with carotid duplex.  Discussed s/s of stroke and he knows to call 911  should he have any of these issues.  -d/w Dr. Myra Gianotti and [facility]   -he is on statin/asa.  He feels the statin may cause dizziness.  I discussed with him to d/w PCP and they may be able to change to new statin.  He states he has had this issue in the past.  -he will call sooner if he has any issues.    Doreatha Massed, [facility] [phone]  Clinic MD:  Myra Gianotti

## 2019-09-19 ENCOUNTER — Other Ambulatory Visit: Payer: Self-pay | Admitting: *Deleted

## 2019-09-19 DIAGNOSIS — Z9889 Other specified postprocedural states: Secondary | ICD-10-CM

## 2019-09-20 ENCOUNTER — Encounter (HOSPITAL_COMMUNITY): Payer: Medicare Other

## 2019-09-21 ENCOUNTER — Other Ambulatory Visit: Payer: Self-pay | Admitting: Cardiovascular Disease

## 2019-09-24 ENCOUNTER — Encounter: Payer: Medicare Other | Admitting: Surgery

## 2019-09-25 ENCOUNTER — Ambulatory Visit: Payer: Medicare Other | Admitting: Cardiovascular Disease

## 2019-10-15 ENCOUNTER — Other Ambulatory Visit: Payer: Self-pay | Admitting: Cardiovascular Disease

## 2019-10-24 ENCOUNTER — Other Ambulatory Visit: Payer: Self-pay

## 2019-10-24 ENCOUNTER — Ambulatory Visit (INDEPENDENT_AMBULATORY_CARE_PROVIDER_SITE_OTHER): Payer: Medicare Other | Admitting: Cardiovascular Disease

## 2019-10-24 ENCOUNTER — Encounter: Payer: Self-pay | Admitting: Cardiovascular Disease

## 2019-10-24 VITALS — BP 158/78 | HR 65 | Ht 67.0 in | Wt 145.6 lb

## 2019-10-24 DIAGNOSIS — E782 Mixed hyperlipidemia: Secondary | ICD-10-CM | POA: Diagnosis not present

## 2019-10-24 DIAGNOSIS — I10 Essential (primary) hypertension: Secondary | ICD-10-CM | POA: Diagnosis not present

## 2019-10-24 DIAGNOSIS — I6522 Occlusion and stenosis of left carotid artery: Secondary | ICD-10-CM

## 2019-10-24 MED ORDER — PITAVASTATIN CALCIUM 2 MG PO TABS: 2.0000 mg | ORAL_TABLET | Freq: Every day | ORAL | 3 refills | Status: DC

## 2019-10-24 NOTE — Assessment & Plan Note (Signed)
History of essential hypertension blood pressure measured today 158/78.  He is on amlodipine and losartan.

## 2019-10-24 NOTE — Progress Notes (Signed)
10/24/2019 [name]   [birth year]  [id number]  Primary Physician Farris Has, MD Primary Cardiologist: Runell Gess MD Milagros Loll, [place], [place]  HPI:  Jonathan Savage is a 79 y.o.  thin appearing widowed Caucasian male father of 3 daughters, grandfather of 5 grandchildren referred by Dr.Wolters, his PCP, for a brief episode of syncope. He is retired Music therapist.  I last saw him in the office 06/08/2019. He was recently relocated from [name] to [place] to be closer to his daughter Camelia Eng.  I just saw him in the office on 06/05/2019.His cardiac risk factor profile is notable for treated hypertension and untreated hyperlipidemia. Father did have bypass surgery. 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 He is never had a heart attack. Denies chest pain or shortness of breath. He had a recent episode of brief loss of consciousness while trimming bushes in the yard during the hot summer day. He attributes this to overheating and dehydration.  I obtained carotid Dopplers on him on 06/07/2019 that showed high-grade left ICA stenosis.  He is also has been complaining claudication.  He denies chest pain or shortness of breath.  I referred him to Dr. Myra Gianotti who performed uneventful left carotid endarterectomy on 08/22/2019.  He had a nonischemic Myoview performed for preoperative clearance 06/21/2019.  He did have a vagal episode postoperatively and was seen by Dr. Flora Lipps.   Current Meds  Medication Sig   amLODipine (NORVASC) 2.5 MG tablet TAKE 3 TABLETS (7.5 MG TOTAL) BY MOUTH DAILY.   aspirin EC 81 MG tablet Take 81 mg by mouth daily.   losartan (COZAAR) 100 MG tablet Take 100 mg by mouth every evening.    [DISCONTINUED] rosuvastatin (CRESTOR) 5 MG tablet Take 1 tablet (5 mg total) by mouth daily.     No Known Allergies  Social History   Socioeconomic History   Marital status:  Widowed    Spouse name: Not on file   Number of children: Not on file   Years of education: Not on file   Highest education level: Not on file  Occupational History   Not on file  Tobacco Use   Smoking status: Former Smoker   Smokeless tobacco: Never Used  Building services engineer Use: Never used  Substance and Sexual Activity   Alcohol use: Never   Drug use: Never   Sexual activity: Not on file  Other Topics Concern   Not on file  Social History Narrative   Not on file   Social Determinants of Health   Financial Resource Strain:    Difficulty of Paying Living Expenses: Not on file  Food Insecurity:    Worried About Running Out of Food in the Last Year: Not on file   Ran Out of Food in the Last Year: Not on file  Transportation Needs:    Lack of Transportation (Medical): Not on file   Lack of Transportation (Non-Medical): Not on file  Physical Activity:    Days of Exercise per Week: Not on file   Minutes of Exercise per Session: Not on file  Stress:    Feeling of Stress : Not on file  Social Connections:    Frequency of Communication with Friends and Family: Not on file   Frequency of Social Gatherings with Friends and Family: Not on file   Attends Religious Services: Not on file   Active Member of Clubs  or Organizations: Not on file   Attends Banker Meetings: Not on file   Marital Status: Not on file  Intimate Partner Violence:    Fear of Current or Ex-Partner: Not on file   Emotionally Abused: Not on file   Physically Abused: Not on file   Sexually Abused: Not on file     Review of Systems: General: negative for chills, fever, night sweats or weight changes.  Cardiovascular: negative for chest pain, dyspnea on exertion, edema, orthopnea, palpitations, paroxysmal nocturnal dyspnea or shortness of breath Dermatological: negative for rash Respiratory: negative for cough or wheezing Urologic: negative for  hematuria Abdominal: negative for nausea, vomiting, diarrhea, bright red blood per rectum, melena, or hematemesis Neurologic: negative for visual changes, syncope, or dizziness All other systems reviewed and are otherwise negative except as noted above.    Blood pressure (!) 158/78, pulse 65, height 5\' 7"  (1.702 m), weight 145 lb 9.6 oz (66 kg), SpO2 99 %.  General appearance: alert and no distress Neck: no adenopathy, no carotid bruit, no JVD, supple, symmetrical, trachea midline and thyroid not enlarged, symmetric, no tenderness/mass/nodules Lungs: clear to auscultation bilaterally Heart: regular rate and rhythm, S1, S2 normal, no murmur, click, rub or gallop Extremities: extremities normal, atraumatic, no cyanosis or edema Pulses: 2+ and symmetric Skin: Skin color, texture, turgor normal. No rashes or lesions Neurologic: Alert and oriented X 3, normal strength and tone. Normal symmetric reflexes. Normal coordination and gait  EKG sinus rhythm at 65 with nonspecific ST and T wave changes.  I personally reviewed this EKG.  ASSESSMENT AND PLAN:   Essential hypertension History of essential hypertension blood pressure measured today 158/78.  He is on amlodipine and losartan.  Hyperlipidemia History of hyperlipidemia low-dose rosuvastatin which she appears to be intolerant to.  His last lipid profile performed 05/21/2019 revealing total cholesterol 187, LDL 126 and HDL of 38."  I am going to change him to Livalo 2 mg a day and we will recheck a lipid lipid liver profile in 2 months  Carotid stenosis, asymptomatic History of asymptomatic high-grade left internal carotid artery stenosis status post uneventful endarterectomy performed by Dr. 05/23/2019 08/22/2019.  He did have a low risk nonischemic Myoview performed 06/21/2019 for preoperative clearance.      06/23/2019 MD FACP,FACC,FAHA, [facility] 10/24/2019 12:03 PM

## 2019-10-24 NOTE — Patient Instructions (Signed)
Medication Instructions:  STOP the Rosuvastatin START the Pitavastatin 2 mg once daily  *If you need a refill on your cardiac medications before your next appointment, please call your pharmacy*   Lab Work: Your provider would like for you to return in 2 months to have the following labs drawn: fasting Lipid and Liver. You do not need an appointment for the lab. Once in our office lobby there is a podium where you can sign in and ring the doorbell to alert [place] that you are here. The lab is open from 8:00 am to 4:30 pm; closed for lunch from 12:45pm-1:45pm.  If you have labs (blood work) drawn today and your tests are completely normal, you will receive your results only by: Marland Kitchen MyChart Message (if you have MyChart) OR . A paper copy in the mail If you have any lab test that is abnormal or we need to change your treatment, we will call you to review the results.   Testing/Procedures: None ordered   Follow-Up: At [facility], you and your health needs are our priority.  As part of our continuing mission to provide you with exceptional heart care, we have created designated Provider Care Teams.  These Care Teams include your primary Cardiologist (physician) and Advanced Practice Providers (APPs -  Physician Assistants and Nurse Practitioners) who all work together to provide you with the care you need, when you need it.  We recommend signing up for the patient portal called "MyChart".  Sign up information is provided on this After Visit Summary.  MyChart is used to connect with patients for Virtual Visits (Telemedicine).  Patients are able to view lab/test results, encounter notes, upcoming appointments, etc.  Non-urgent messages can be sent to your provider as well.   To learn more about what you can do with MyChart, go to [email or web address].    Your next appointment:   3 month(s)  The format for your next appointment:   In Person  Provider:   You may see Nanetta Batty, MD or one  of the following Advanced Practice Providers on your designated Care Team:    Corine Shelter, PA-C  [place], [place]  Edd Fabian, [place]

## 2019-10-24 NOTE — Assessment & Plan Note (Signed)
History of asymptomatic high-grade left internal carotid artery stenosis status post uneventful endarterectomy performed by Dr. Myra Gianotti 08/22/2019.  He did have a low risk nonischemic Myoview performed 06/21/2019 for preoperative clearance.

## 2019-10-24 NOTE — Assessment & Plan Note (Signed)
History of hyperlipidemia low-dose rosuvastatin which she appears to be intolerant to.  His last lipid profile performed 05/21/2019 revealing total cholesterol 187, LDL 126 and HDL of 38."  I am going to change him to Livalo 2 mg a day and we will recheck a lipid lipid liver profile in 2 months

## 2019-10-26 ENCOUNTER — Telehealth: Payer: Self-pay | Admitting: Cardiovascular Disease

## 2019-10-26 NOTE — Telephone Encounter (Signed)
Not sure what his history of tried statins is.  Lets start him on ezetimibe 10 mg daily for now and have him schedule a visit with Raquel/myself to discuss options.  He may need to go to PCKS-9 inhibitors

## 2019-10-26 NOTE — Telephone Encounter (Signed)
Pt c/o medication issue:  1. Name of Medication: Livalo 2 MG  2. How are you currently taking this medication (dosage and times per day)? As directed  3. Are you having a reaction (difficulty breathing--STAT)? No   4. What is your medication issue? Patient has concerns regarding expense of this medication. He states it will cost $200+ to pick up this medication from the pharmacy. He is requesting to speak with Dr. Hazle Coca nurse to discuss further. Please call.

## 2019-10-26 NOTE — Telephone Encounter (Signed)
Returned the call to the patient. He stated that the Livalo will cost too much for him. He has been advised that he may need a prior auth.  He did state that he would rather not take the Livalo due to reading that it can cause kidney problems and he only has one kidney.

## 2019-10-29 MED ORDER — EZETIMIBE 10 MG PO TABS: 10.0000 mg | ORAL_TABLET | Freq: Every day | ORAL | 3 refills | Status: DC

## 2019-10-29 NOTE — Telephone Encounter (Signed)
Returned the call to the patient. He stated that he was hesitant to start the Zetia but would read up on it. He did ask that it be sent in.   He will call back with what he has decided.

## 2019-10-29 NOTE — Telephone Encounter (Signed)
Agreed. Can you stay on top of this Belenda Cruise?  Thx, JJB

## 2019-11-19 ENCOUNTER — Other Ambulatory Visit: Payer: Self-pay | Admitting: Surgery

## 2019-11-19 ENCOUNTER — Other Ambulatory Visit: Payer: Self-pay | Admitting: Cardiovascular Disease

## 2019-11-19 NOTE — Telephone Encounter (Signed)
Rx has been sent to the pharmacy electronically. ° °

## 2019-12-07 ENCOUNTER — Encounter: Payer: Self-pay | Admitting: Cardiovascular Disease

## 2019-12-07 ENCOUNTER — Other Ambulatory Visit: Payer: Self-pay

## 2019-12-07 ENCOUNTER — Ambulatory Visit (INDEPENDENT_AMBULATORY_CARE_PROVIDER_SITE_OTHER): Payer: Medicare Other | Admitting: Cardiovascular Disease

## 2019-12-07 VITALS — BP 142/69 | HR 67 | Ht 66.5 in | Wt 148.2 lb

## 2019-12-07 DIAGNOSIS — I6522 Occlusion and stenosis of left carotid artery: Secondary | ICD-10-CM

## 2019-12-07 NOTE — Patient Instructions (Signed)
Medication Instructions:  Your physician recommends that you continue on your current medications as directed. Please refer to the Current Medication list given to you today.  *If you need a refill on your cardiac medications before your next appointment, please call your pharmacy*  Follow-Up: At [facility], you and your health needs are our priority.  As part of our continuing mission to provide you with exceptional heart care, we have created designated Provider Care Teams.  These Care Teams include your primary Cardiologist (physician) and Advanced Practice Providers (APPs -  Physician Assistants and Nurse Practitioners) who all work together to provide you with the care you need, when you need it.  We recommend signing up for the patient portal called "MyChart".  Sign up information is provided on this After Visit Summary.  MyChart is used to connect with patients for Virtual Visits (Telemedicine).  Patients are able to view lab/test results, encounter notes, upcoming appointments, etc.  Non-urgent messages can be sent to your provider as well.   To learn more about what you can do with MyChart, go to [email or web address].    Your next appointment:   12 month(s)  The format for your next appointment:   In Person  Provider:   Nanetta Batty, MD

## 2019-12-19 LAB — LIPID PANEL
Chol/HDL Ratio: 2.9 ratio (ref 0.0–5.0)
Cholesterol, Total: 163 mg/dL (ref 100–199)
HDL: 56 mg/dL (ref >39)
LDL Chol Calc (NIH): 92 mg/dL (ref 0–99)
Triglycerides: 78 mg/dL (ref 0–149)
VLDL Cholesterol Cal: 15 mg/dL (ref 5–40)

## 2019-12-19 LAB — HEPATIC FUNCTION PANEL
ALT: 16 IU/L (ref 0–44)
AST: 20 IU/L (ref 0–40)
Albumin: 4.6 g/dL (ref 3.7–4.7)
Alkaline Phosphatase: 85 IU/L (ref 44–121)
Bilirubin Total: 0.4 mg/dL (ref 0.0–1.2)
Bilirubin, Direct: <0.1 mg/dL (ref 0.00–0.40)
Total Protein: 7.6 g/dL (ref 6.0–8.5)

## 2020-01-25 ENCOUNTER — Ambulatory Visit: Payer: Medicare Other | Admitting: Cardiovascular Disease

## 2020-02-06 ENCOUNTER — Telehealth: Payer: Self-pay | Admitting: Cardiovascular Disease

## 2020-02-06 DIAGNOSIS — E782 Mixed hyperlipidemia: Secondary | ICD-10-CM

## 2020-02-06 MED ORDER — ROSUVASTATIN CALCIUM 10 MG PO TABS: 10.0000 mg | ORAL_TABLET | Freq: Every day | ORAL | 3 refills | Status: DC

## 2020-02-06 NOTE — Telephone Encounter (Signed)
We'll see what his lab work shows today and make further recs

## 2020-02-06 NOTE — Telephone Encounter (Signed)
Spoke with patient. Patient wanted to review cholesterol labs he had done in December. He reports he does not use mychart and did not see the results. He would like a call with all future results. Per Dr. Hazle Coca result note Rosuvastatin 10mg  to be started and L/L in 2 months. Patient has been taking 5mg  of Rosuvastatin daily, states he got used to the side effects so he continued to take the medication. Medication list updated. Patient is now on Zetia and Crestor. Will route to MD to make sure that is okay. Labs placed and mailed to patient to be completed in April since he is starting the higher dose today.

## 2020-02-06 NOTE — Telephone Encounter (Signed)
° ° °  Pt requesting to speak with Dr. Hazle Coca nurse

## 2020-02-07 NOTE — Telephone Encounter (Signed)
Labs 2 months after change of meds

## 2020-06-16 ENCOUNTER — Other Ambulatory Visit: Payer: Self-pay

## 2020-06-16 ENCOUNTER — Encounter: Payer: Self-pay | Admitting: Surgery

## 2020-06-16 ENCOUNTER — Ambulatory Visit (HOSPITAL_COMMUNITY)
Admission: RE | Admit: 2020-06-16 | Discharge: 2020-06-16 | Disposition: A | Discharge status: Home / Self Care | Payer: Medicare Other | Source: Ambulatory Visit | Attending: Surgery | Admitting: Surgery

## 2020-06-16 ENCOUNTER — Ambulatory Visit (INDEPENDENT_AMBULATORY_CARE_PROVIDER_SITE_OTHER): Payer: Medicare Other | Admitting: Surgery

## 2020-06-16 VITALS — BP 159/75 | HR 66 | Temp 98.5°F | Resp 20 | Ht 66.5 in | Wt 148.0 lb

## 2020-06-16 DIAGNOSIS — I6523 Occlusion and stenosis of bilateral carotid arteries: Secondary | ICD-10-CM

## 2020-06-16 DIAGNOSIS — Z9889 Other specified postprocedural states: Secondary | ICD-10-CM | POA: Insufficient documentation
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
## 2020-06-24 ENCOUNTER — Encounter: Payer: Self-pay | Admitting: Neurology

## 2020-07-28 ENCOUNTER — Telehealth: Payer: Self-pay | Admitting: Cardiovascular Disease

## 2020-07-28 MED ORDER — LOSARTAN POTASSIUM 100 MG PO TABS
100.0000 mg | ORAL_TABLET | Freq: Every evening | ORAL | 3 refills | Status: DC
Start: 2020-07-28 — End: 2020-09-11

## 2020-07-28 NOTE — Telephone Encounter (Signed)
*  STAT* If patient is at the pharmacy, call can be transferred to refill team.   1. Which medications need to be refilled? (please list name of each medication and dose if known)  losartan (COZAAR) 100 MG tablet  2. Which pharmacy/location (including street and city if local pharmacy) is medication to be sent to? [facility] - [address], [place] [address]   3. Do they need a 30 day or 90 day supply?  90 day supply  Patient's daughter states the patient is completely out of medication.

## 2020-07-28 NOTE — Telephone Encounter (Signed)
Refill for losartan sent  07/28/20

## 2020-08-25 ENCOUNTER — Ambulatory Visit (INDEPENDENT_AMBULATORY_CARE_PROVIDER_SITE_OTHER): Payer: Medicare Other | Admitting: Neurology

## 2020-08-25 ENCOUNTER — Encounter: Payer: Self-pay | Admitting: Neurology

## 2020-08-25 ENCOUNTER — Other Ambulatory Visit: Payer: Self-pay

## 2020-08-25 VITALS — BP 169/83 | HR 86 | Ht 67.0 in | Wt 146.0 lb

## 2020-08-25 DIAGNOSIS — I6523 Occlusion and stenosis of bilateral carotid arteries: Secondary | ICD-10-CM

## 2020-08-25 DIAGNOSIS — M4802 Spinal stenosis, cervical region: Secondary | ICD-10-CM | POA: Diagnosis not present

## 2020-08-25 DIAGNOSIS — Z9889 Other specified postprocedural states: Secondary | ICD-10-CM

## 2020-08-25 DIAGNOSIS — R292 Abnormal reflex: Secondary | ICD-10-CM | POA: Diagnosis not present

## 2020-08-25 DIAGNOSIS — M4722 Other spondylosis with radiculopathy, cervical region: Secondary | ICD-10-CM

## 2020-08-25 NOTE — Patient Instructions (Addendum)
MRI cervical spine without contrast  We will call you with the results 
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
## 2020-09-10 ENCOUNTER — Telehealth: Payer: Self-pay | Admitting: Pharmacist

## 2020-09-10 NOTE — Telephone Encounter (Signed)
Patient daughter called stating that pt blood pressure has been high. 169/83. States he has been feeling dizzy, uneasy on his feet. She reports that patient does not like taking medications. Thinks they make him feel poorly. Daughter states he is very sensitive to medications. Anytime the pharmacy changes manufactures, he is affected. He has seen Laural Golden in the past Scheduled to be seen in office tomorrow

## 2020-09-11 ENCOUNTER — Other Ambulatory Visit: Payer: Self-pay

## 2020-09-11 ENCOUNTER — Ambulatory Visit (INDEPENDENT_AMBULATORY_CARE_PROVIDER_SITE_OTHER): Payer: Medicare Other | Admitting: Pharmacist

## 2020-09-11 VITALS — BP 146/70 | HR 79

## 2020-09-11 DIAGNOSIS — I10 Essential (primary) hypertension: Secondary | ICD-10-CM | POA: Diagnosis not present

## 2020-09-11 DIAGNOSIS — I6523 Occlusion and stenosis of bilateral carotid arteries: Secondary | ICD-10-CM | POA: Diagnosis not present

## 2020-09-11 MED ORDER — IRBESARTAN 150 MG PO TABS: 150.0000 mg | ORAL_TABLET | Freq: Every day | ORAL | 3 refills | Status: DC
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
## 2020-09-11 NOTE — Patient Instructions (Addendum)
STOP taking losartan START taking irbesartan 150mg  daily at bedtime Continue amlodipine 7.5mg  daily  Check your blood pressure once a day Please bring in your blood pressure cuff /log to your next visit  Call me at [phone] with any questions

## 2020-09-13 ENCOUNTER — Other Ambulatory Visit: Payer: Self-pay

## 2020-09-13 ENCOUNTER — Ambulatory Visit
Admission: RE | Admit: 2020-09-13 | Discharge: 2020-09-13 | Disposition: A | Discharge status: Home / Self Care | Payer: Medicare Other | Source: Ambulatory Visit | Attending: Neurology | Admitting: Neurology

## 2020-09-13 DIAGNOSIS — M4722 Other spondylosis with radiculopathy, cervical region: Secondary | ICD-10-CM

## 2020-09-13 IMAGING — MR MR CERVICAL SPINE W/O CM
4 of 5 series · 30 of 48 positions shown · non-contrast
Comparison: None.

CLINICAL DATA: Cervical spondylosis with radiculopathy. Spinal
stenosis, C-spine.

EXAM:
MRI CERVICAL SPINE WITHOUT CONTRAST
TECHNIQUE: Multiplanar, multisequence MR imaging of the cervical spine was
performed. No intravenous contrast was administered.

[Series 3: T2 · sagittal · 3.0mm · 0.82mm/px · 8 of 18 slices shown (1 of 2)]
[im 1/18]
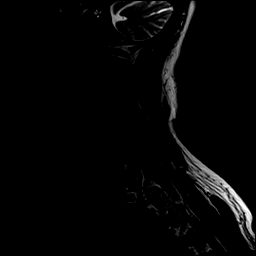
[im 3/18]
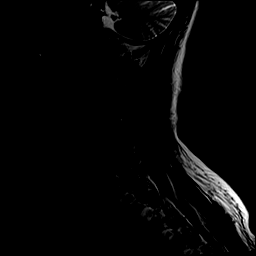
[im 5/18]
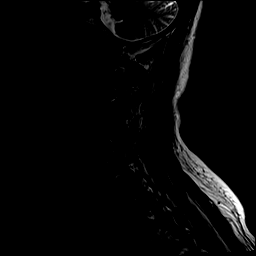
[im 8/18]
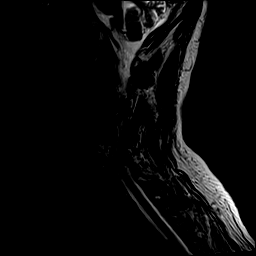
[im 10/18]
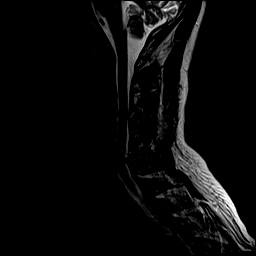
[im 13/18]
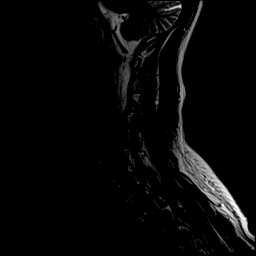
[im 15/18]
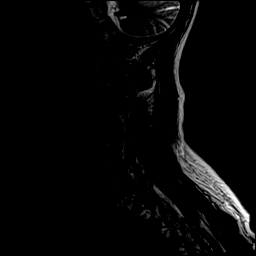
[im 18/18]
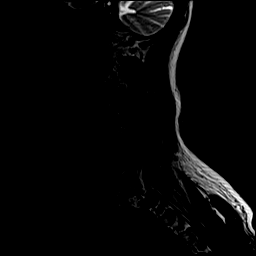

[Series 4: T1 · sagittal · 3.0mm · 0.41mm/px · 7 of 18 slices shown]
[im 1/18]
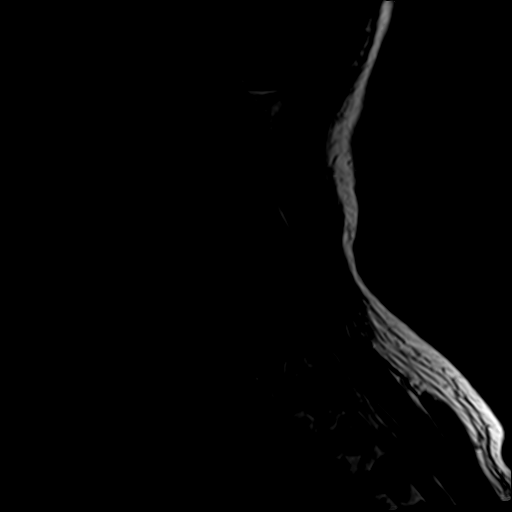
[im 3/18]
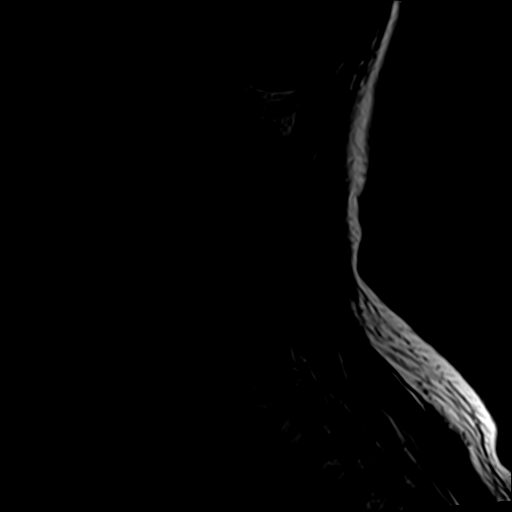
[im 6/18]
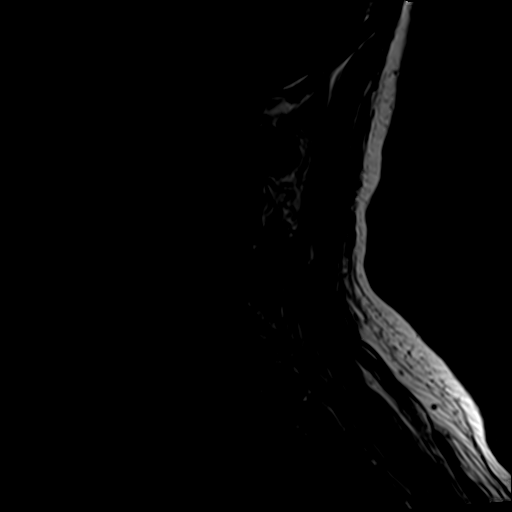
[im 9/18]
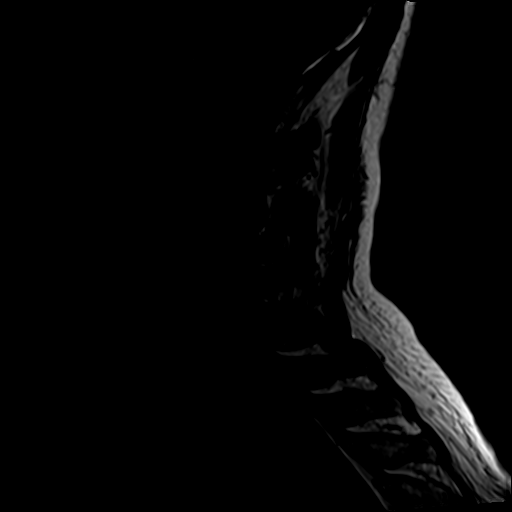
[im 12/18]
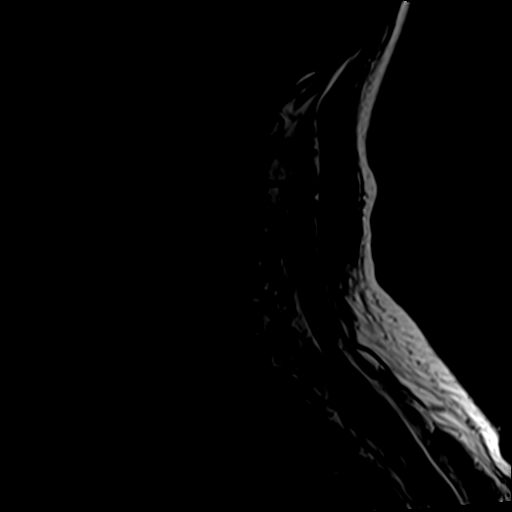
[im 15/18]
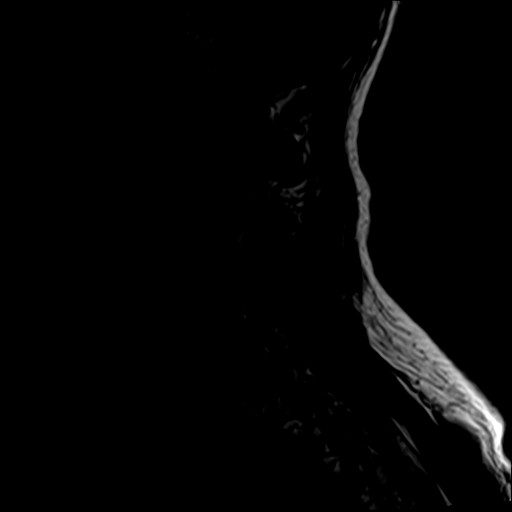
[im 18/18]
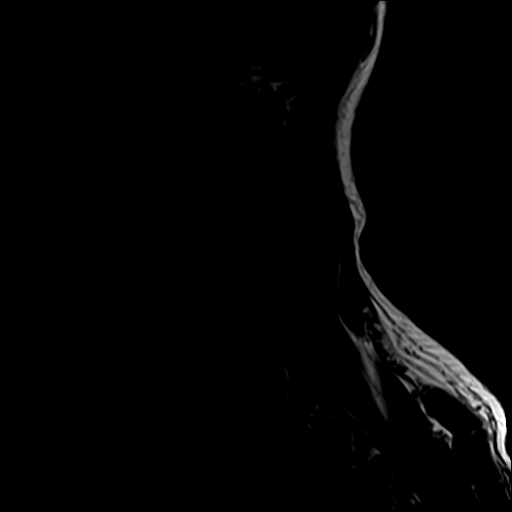

[Series 5: tir sag · sagittal · 3.0mm · 0.41mm/px · 6 of 18 slices shown]
[im 1/18]
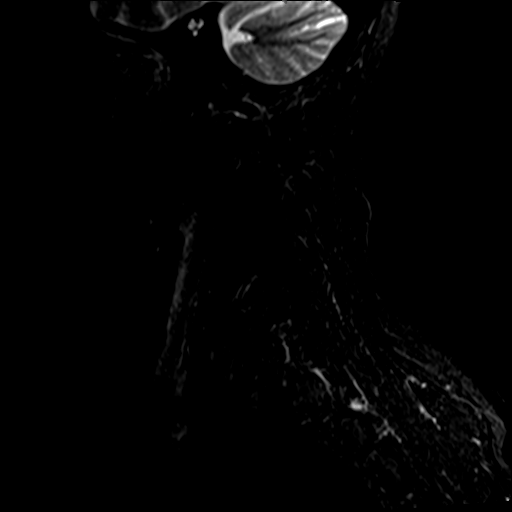
[im 3/18]
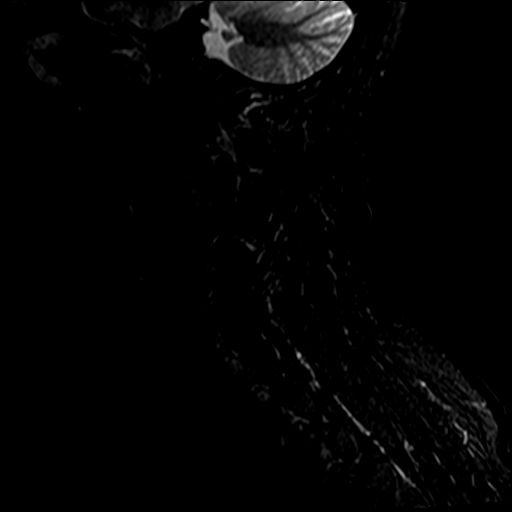
[im 6/18]
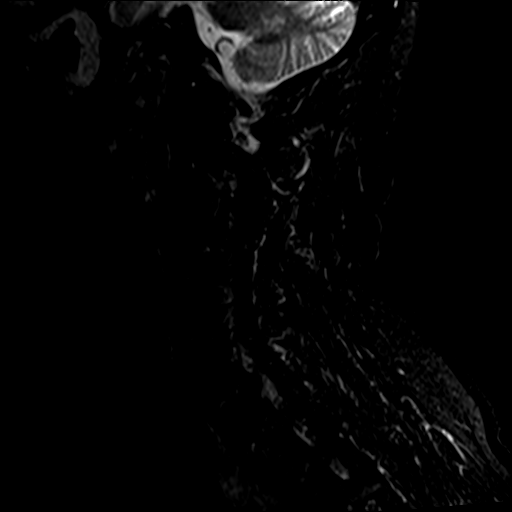
[im 9/18]
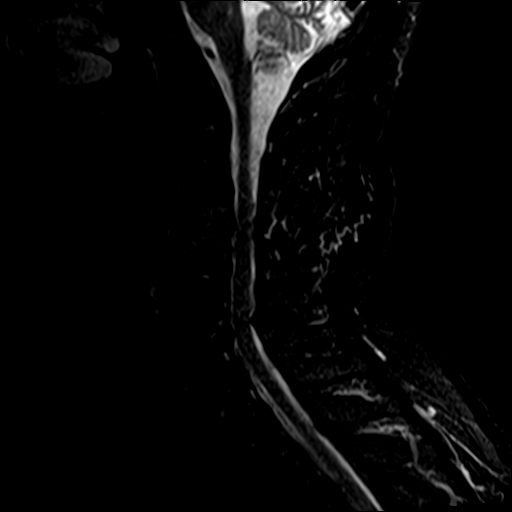
[im 12/18]
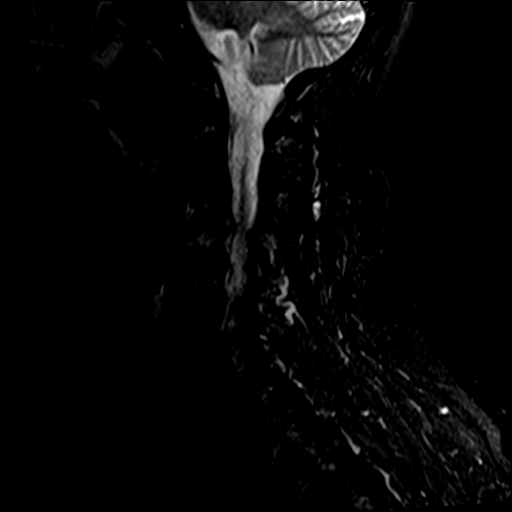
[im 15/18]
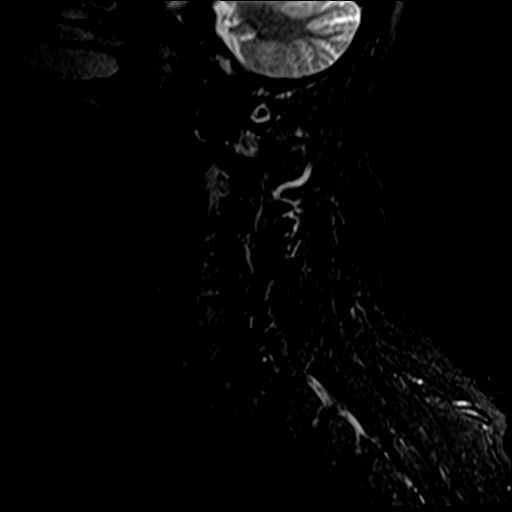

[Series 7: T2 · axial · 3.0mm · 0.70mm/px · z∈[-43,+66]mm · 9 of 31 slices shown (2 of 2)]
[im 1/31]
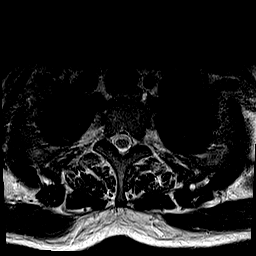
[im 6/31]
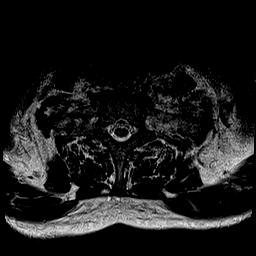
[im 11/31]
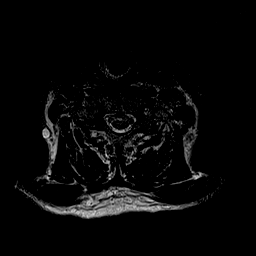
[im 13/31]
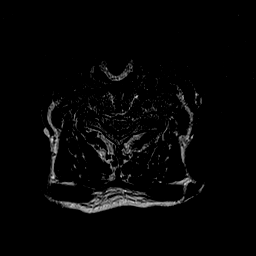
[im 16/31]
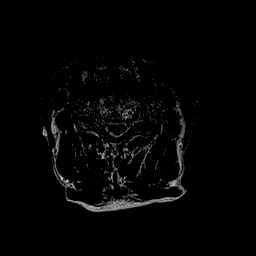
[im 18/31]
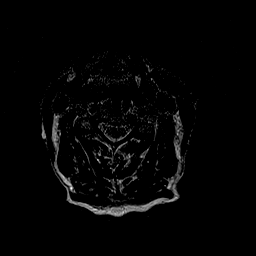
[im 21/31]
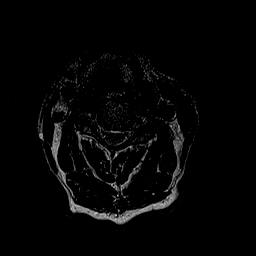
[im 26/31]
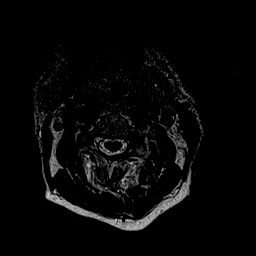
[im 31/31]
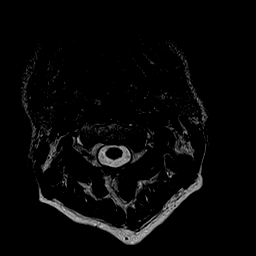

[30 of 48 positions shown; findings below may reference images not displayed]

FINDINGS: Alignment: Retrolisthesis of C6 over C7.

Vertebrae: No fracture, evidence of discitis, or bone lesion.
Postsurgical changes from C5-6 ACDF.

Cord: Mild mass effect on the cord at C3-4 and C6-7. No cord signal
abnormality.

Posterior Fossa, vertebral arteries, paraspinal tissues: Negative.

Disc levels:

C2-3: No spinal canal or neural foraminal stenosis.

C3-4: Posterior disc osteophyte complex resulting in mild spinal
canal stenosis with mild mass effect on the cord without cord signal
abnormality. Uncovertebral and facet degenerative changes resulting
in moderate bilateral neural foraminal narrowing.

C4-5: Small posterior disc protrusion without significant spinal
canal stenosis. Uncovertebral and facet degenerative changes
resulting in mild bilateral neural foraminal narrowing.

C5-6: No spinal canal or neural foraminal stenosis.

C6-7: Posterior disc osteophyte complex resulting in mild spinal
canal stenosis with mild mass effect on the cord. Uncovertebral and
facet degenerative changes resulting in severe bilateral neural
foraminal narrowing.

C7-T1: Small posterior disc protrusion causing indentation of the
thecal sac without significant spinal canal or neural foraminal
stenosis.
IMPRESSION: 1. Degenerative changes of the cervical spine with mild spinal canal
stenosis at C3-4 and C6-7 with mild mass effect on the cord at this
level without cord signal abnormality.
2. Severe bilateral neural foraminal narrowing at C6-7 and moderate
at C3-4.

## 2020-09-19 ENCOUNTER — Telehealth: Payer: Self-pay | Admitting: Neurology

## 2020-09-19 DIAGNOSIS — R202 Paresthesia of skin: Secondary | ICD-10-CM

## 2020-09-19 NOTE — Telephone Encounter (Signed)
Please inform pt that his MR shows arthritic changes in the spine with narrowing above and below his prior surgery.  This is most likely which is causing his left arm numbness/tingling.  To investigate this further, we can order nerve testing of the left arm and start neck PT.  If symptoms get worse, we can get the opinion of neurosurgery to see what management options we have.

## 2020-09-19 NOTE — Telephone Encounter (Signed)
Jonathan Savage would like a call back for test results of the MRI she left a VM

## 2020-09-19 NOTE — Telephone Encounter (Signed)
Pt daughter called about her fathers MRI results. She would like for her dad and herself to be on phone. She would like to be called at 3-4pm.

## 2020-09-22 NOTE — Addendum Note (Signed)
Addended by: Karl Luke A on: 09/22/2020 10:19 AM   Modules accepted: Orders

## 2020-09-22 NOTE — Telephone Encounter (Signed)
Called patient and daughter Camelia Eng and informed them that patients MRI shows arthritic changes in the spine with narrowing above and below his prior surgery.  This is most likely which is causing his left arm numbness/tingling.  To investigate this further, we can order nerve testing of the left arm and start neck PT.  If symptoms get worse, we can get the opinion of neurosurgery to see what management options we have.  Patient is ok with EMG and neck PT recommendations and is aware that someone from our office will call to schedule him for PT. Also, patient is aware that a referral will be sent for Neck PT.   No further questions or concerns were presented.

## 2020-09-23 ENCOUNTER — Ambulatory Visit (INDEPENDENT_AMBULATORY_CARE_PROVIDER_SITE_OTHER): Payer: Medicare Other | Admitting: Pharmacist

## 2020-09-23 ENCOUNTER — Other Ambulatory Visit: Payer: Self-pay

## 2020-09-23 VITALS — BP 120/60 | HR 69

## 2020-09-23 DIAGNOSIS — I10 Essential (primary) hypertension: Secondary | ICD-10-CM | POA: Diagnosis not present
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
## 2020-09-25 ENCOUNTER — Encounter (HOSPITAL_COMMUNITY): Payer: Self-pay

## 2020-09-25 ENCOUNTER — Emergency Department (HOSPITAL_COMMUNITY)
Admission: EM | Admit: 2020-09-25 | Discharge: 2020-09-25 | Disposition: A | Discharge status: Home / Self Care | Payer: Medicare Other | Attending: Emergency Medicine | Admitting: Emergency Medicine

## 2020-09-25 ENCOUNTER — Telehealth: Payer: Self-pay | Admitting: Pharmacist

## 2020-09-25 ENCOUNTER — Emergency Department (HOSPITAL_COMMUNITY): Payer: Medicare Other

## 2020-09-25 ENCOUNTER — Other Ambulatory Visit: Payer: Self-pay

## 2020-09-25 DIAGNOSIS — Z87891 Personal history of nicotine dependence: Secondary | ICD-10-CM | POA: Diagnosis not present

## 2020-09-25 DIAGNOSIS — Z79899 Other long term (current) drug therapy: Secondary | ICD-10-CM | POA: Diagnosis not present

## 2020-09-25 DIAGNOSIS — R55 Syncope and collapse: Secondary | ICD-10-CM | POA: Insufficient documentation

## 2020-09-25 DIAGNOSIS — I10 Essential (primary) hypertension: Secondary | ICD-10-CM | POA: Insufficient documentation

## 2020-09-25 DIAGNOSIS — R109 Unspecified abdominal pain: Secondary | ICD-10-CM | POA: Diagnosis not present

## 2020-09-25 DIAGNOSIS — Z7982 Long term (current) use of aspirin: Secondary | ICD-10-CM | POA: Diagnosis not present

## 2020-09-25 LAB — CBC WITH DIFFERENTIAL/PLATELET
Abs Immature Granulocytes: 0.02 10*3/uL (ref 0.00–0.07)
Basophils Absolute: 0.1 10*3/uL (ref 0.0–0.1)
Basophils Relative: 1 %
Eosinophils Absolute: 0.3 10*3/uL (ref 0.0–0.5)
Eosinophils Relative: 3 %
HCT: 41.4 % (ref 39.0–52.0)
Hemoglobin: 13 g/dL (ref 13.0–17.0)
Immature Granulocytes: 0 %
Lymphocytes Relative: 12 %
Lymphs Abs: 1.3 10*3/uL (ref 0.7–4.0)
MCH: 29 pg (ref 26.0–34.0)
MCHC: 31.4 g/dL (ref 30.0–36.0)
MCV: 92.2 fL (ref 80.0–100.0)
Monocytes Absolute: 1.2 10*3/uL — ABNORMAL HIGH (ref 0.1–1.0)
Monocytes Relative: 11 %
Neutro Abs: 7.7 10*3/uL (ref 1.7–7.7)
Neutrophils Relative %: 73 %
Platelets: 286 10*3/uL (ref 150–400)
RBC: 4.49 MIL/uL (ref 4.22–5.81)
RDW: 14 % (ref 11.5–15.5)
WBC: 10.6 10*3/uL — ABNORMAL HIGH (ref 4.0–10.5)
nRBC: 0 % (ref 0.0–0.2)

## 2020-09-25 LAB — BASIC METABOLIC PANEL
Anion gap: 7 (ref 5–15)
BUN: 24 mg/dL — ABNORMAL HIGH (ref 8–23)
CO2: 27 mmol/L (ref 22–32)
Calcium: 9 mg/dL (ref 8.9–10.3)
Chloride: 105 mmol/L (ref 98–111)
Creatinine, Ser: 1.42 mg/dL — ABNORMAL HIGH (ref 0.61–1.24)
GFR, Estimated: 50 mL/min — ABNORMAL LOW (ref >60)
Glucose, Bld: 94 mg/dL (ref 70–99)
Potassium: 4.2 mmol/L (ref 3.5–5.1)
Sodium: 139 mmol/L (ref 135–145)

## 2020-09-25 IMAGING — CT CT RENAL STONE PROTOCOL
2 of 4 series · 16 of 46 positions shown, 18 images · non-contrast
Comparison: None

CLINICAL DATA: Flank pain, suspected kidney stone in an 80-year-old
male.

EXAM:
CT ABDOMEN AND PELVIS WITHOUT CONTRAST
TECHNIQUE: Multidetector CT imaging of the abdomen and pelvis was performed
following the standard protocol without IV contrast.

[Series 3: renal stone 5.0 · axial · 0.81mm/px · z∈[-466,-86]mm · 13 of 84 slices shown, 15 images]
[im 4/84  soft-tissue]
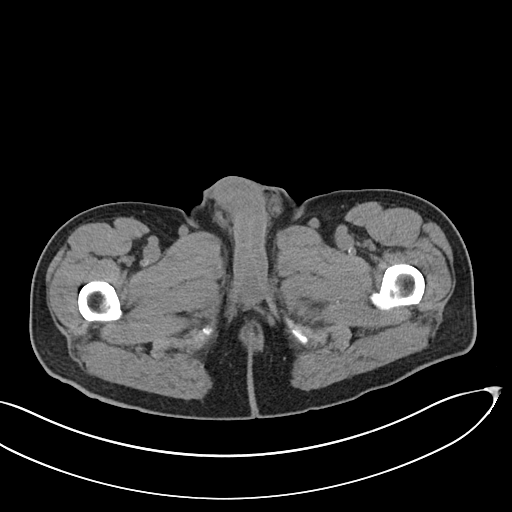
[im 4/84  bone]
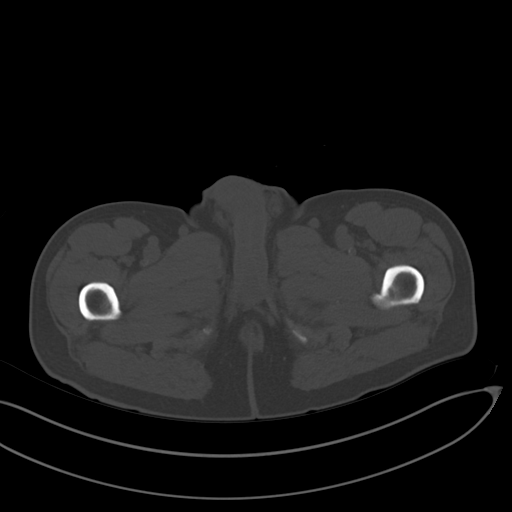
[im 11/84  soft-tissue]
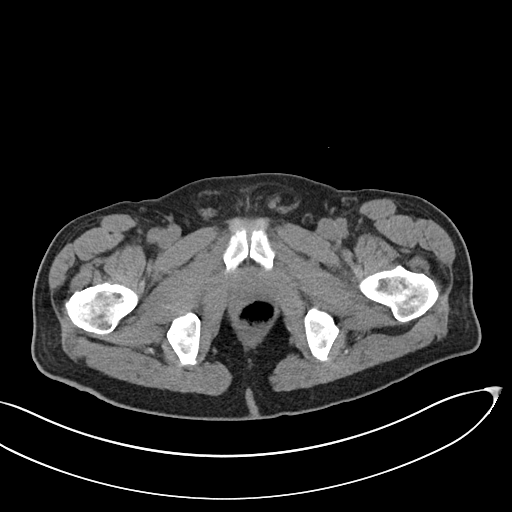
[im 18/84  soft-tissue]
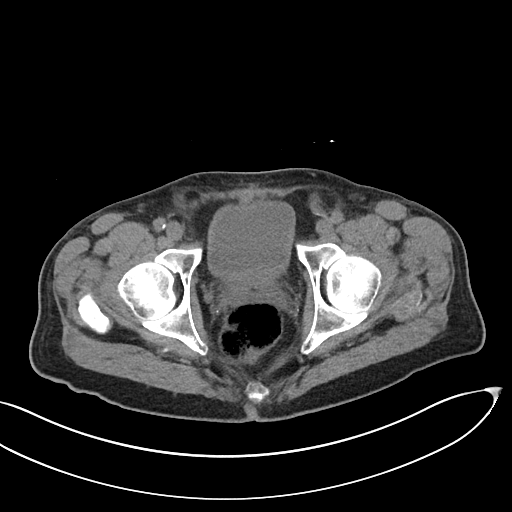
[im 25/84  soft-tissue]
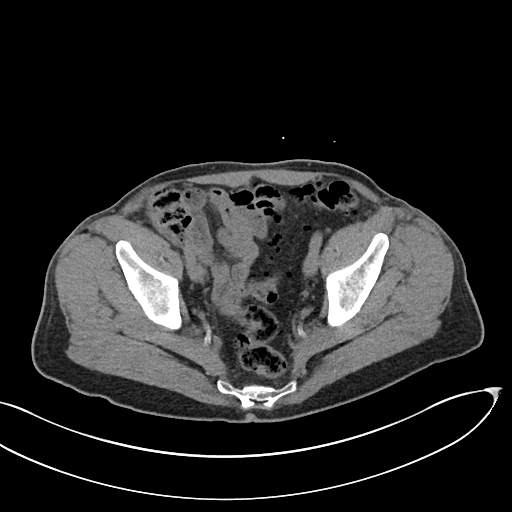
[im 28/84  soft-tissue]
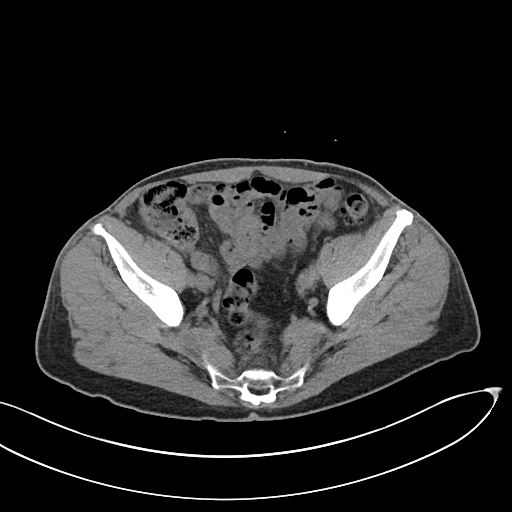
[im 35/84  soft-tissue]
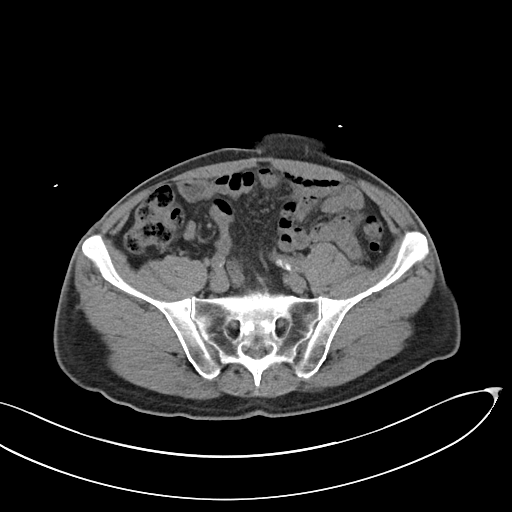
[im 42/84  soft-tissue]
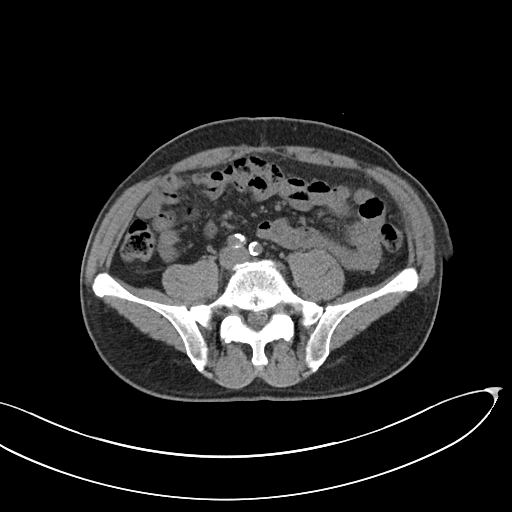
[im 49/84  soft-tissue]
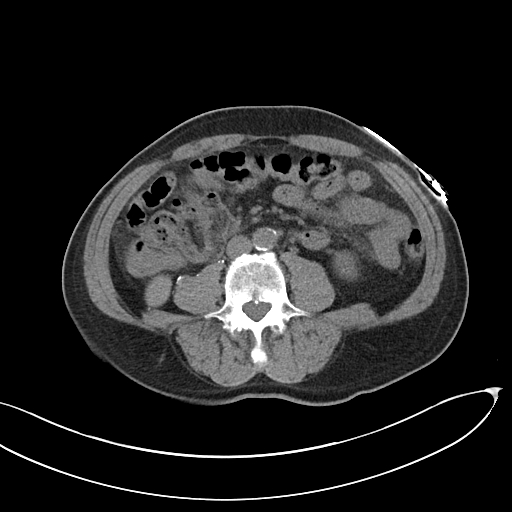
[im 56/84  soft-tissue]
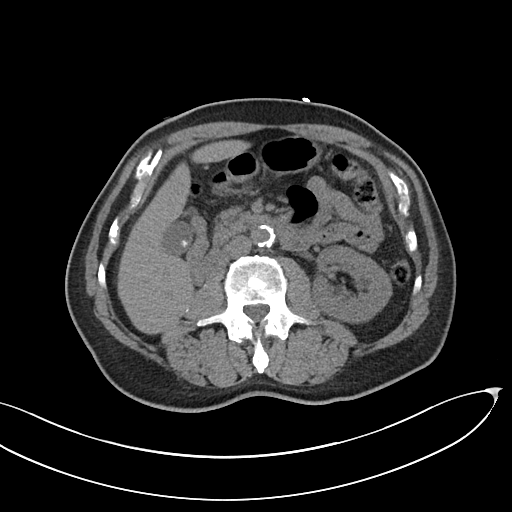
[im 56/84  bone]
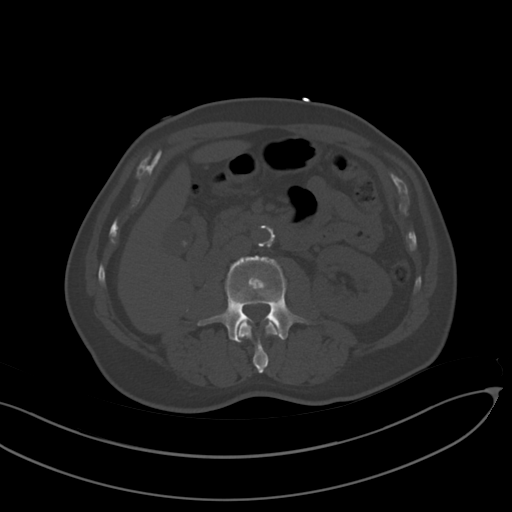
[im 59/84  soft-tissue]
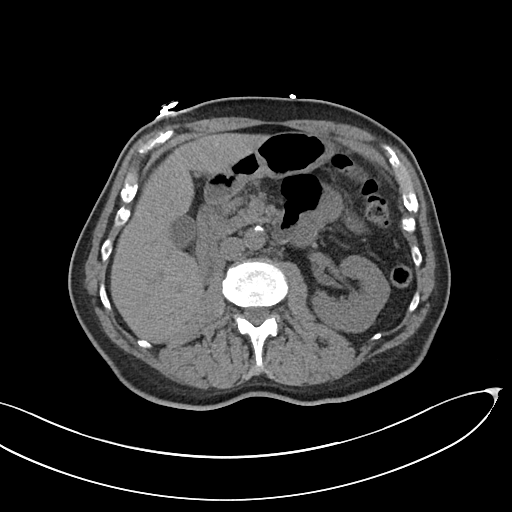
[im 66/84  soft-tissue]
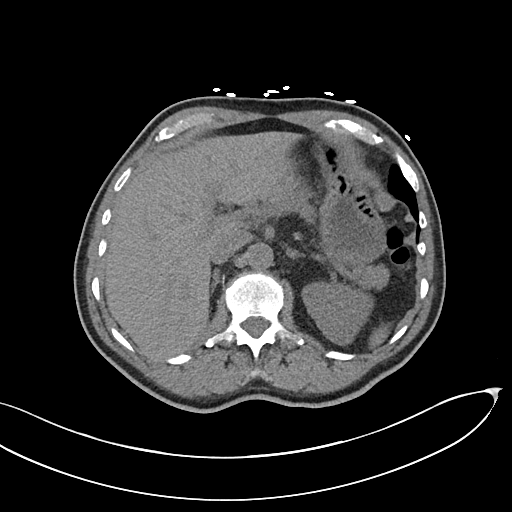
[im 73/84  soft-tissue]
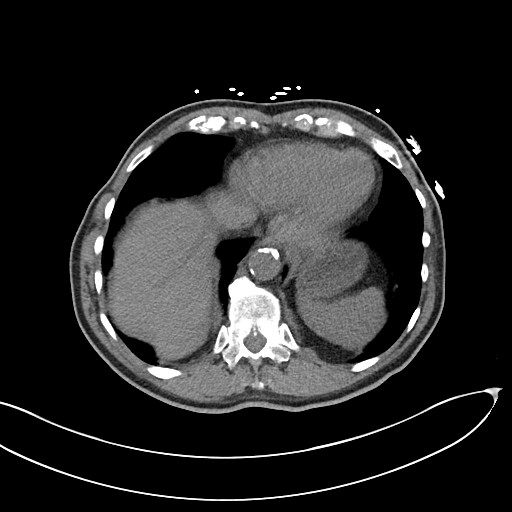
[im 80/84  soft-tissue]
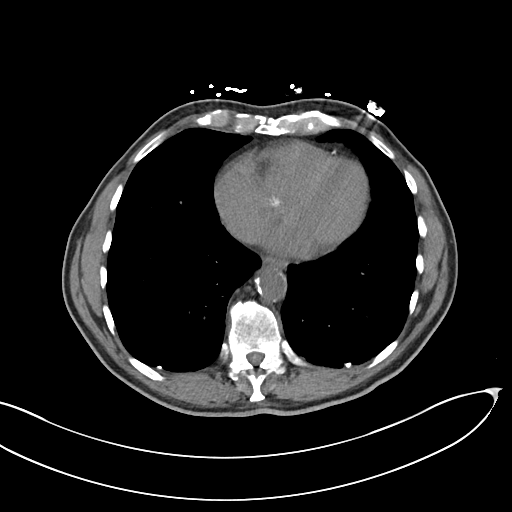

[Series 6: cor · coronal · 0.71mm/px · 3 of 134 slices shown]
[im 45/134  soft-tissue]
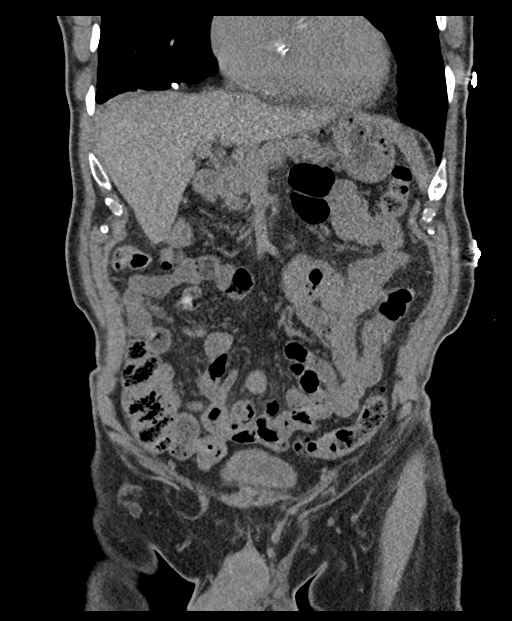
[im 60/134  soft-tissue]
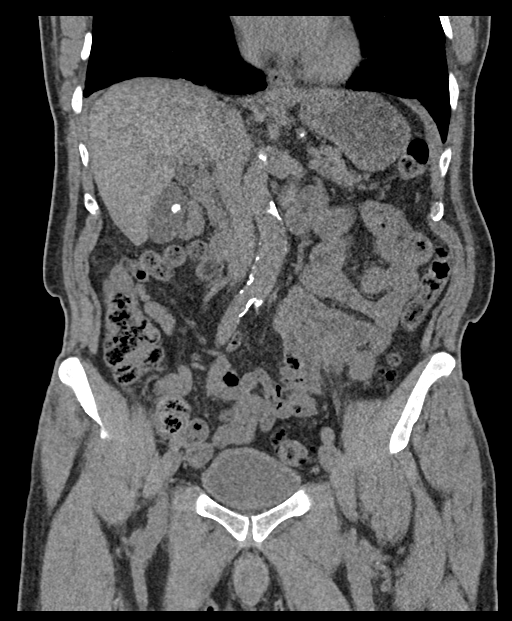
[im 74/134  soft-tissue]
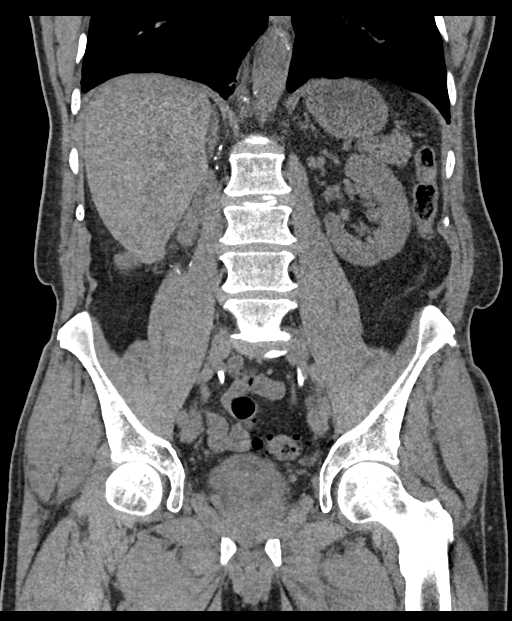

[16 of 46 positions shown; findings below may reference images not displayed]

FINDINGS: Lower chest: Pulmonary granulomata and or calcified calcified
pleural plaques at the lung bases. No effusion. No consolidation.
Basilar scarring or atelectasis. No effusion.

Hepatobiliary: Cholelithiasis. Smooth contours of the liver. No
pericholecystic stranding.

Pancreas: Normal contour the pancreas without adjacent inflammation.

Spleen: Normal

Adrenals/Urinary Tract: Adrenal glands are normal.

Post RIGHT nephrectomy. LEFT kidney without hydronephrosis,
nephrolithiasis or perinephric stranding. No visible ureteral
calculus. Smooth contour of the LEFT kidney.

Stomach/Bowel: No acute gastrointestinal process. Appendix is
normal.

Vascular/Lymphatic:

Aortic atherosclerosis. No sign of aneurysm. Smooth contour of the
IVC. There is no gastrohepatic or hepatoduodenal ligament
lymphadenopathy. No retroperitoneal or mesenteric lymphadenopathy.

No pelvic sidewall lymphadenopathy.

Reproductive: Unremarkable by CT.

Other: No ascites.

Musculoskeletal: Spinal degenerative changes without acute or
destructive bone process.
IMPRESSION: No acute intra-abdominal or pelvic pathology. No LEFT-sided
nephrolithiasis or perinephric stranding.

Post RIGHT nephrectomy.

Cholelithiasis.

Aortic Atherosclerosis ([4S]-[4S]).

## 2020-09-25 MED ORDER — IRBESARTAN 150 MG PO TABS: 75.0000 mg | ORAL_TABLET | Freq: Every day | ORAL | 3 refills | Status: DC

## 2020-09-25 MED ORDER — SODIUM CHLORIDE 0.9 % IV BOLUS
1000.0000 mL | Freq: Once | INTRAVENOUS | Status: AC
  Administered 2020-09-25: 1000 mL via INTRAVENOUS

## 2020-09-25 NOTE — ED Provider Notes (Signed)
[facility] EMERGENCY DEPARTMENT Provider Note   CSN: [id number] Arrival date & time: 09/25/20  1258     History No chief complaint on file.   Jonathan Savage is a 80 y.o. male.  80 yo M with a chief complaints of a near syncopal event.  The patient states this is actually the second time its happened to him in the past week.  He recently had his blood pressure medicine increased.  Tells me that both times he was in the same place he was in a [employer] shop when he was sitting on a stool and suddenly felt like he was going to pass out.  Felt much better very shortly afterwards.  Denied headache neck pain chest pain shortness of breath.  Denies cough congestion fever denies nausea vomiting or diarrhea denies dark stool or blood in stool.  Denies decreased oral intake.  He has been having right flank pain.  Worsening over the past month or so.  Has had left-sided chest pain this been going on for years that is unchanged.  The history is provided by the patient.  Illness Severity:  Moderate Onset quality:  Gradual Duration:  2 minutes Timing:  Rare Progression:  Resolved Chronicity:  Recurrent Associated symptoms: no abdominal pain, no chest pain, no congestion, no diarrhea, no fever, no headaches, no myalgias, no rash, no shortness of breath and no vomiting       Past Medical History:  Diagnosis Date   Carotid artery occlusion    Complication of anesthesia    pt states following last surgery he was awake for over 24 hours following surgery   Hypertension    Stroke [facility])     Patient Active Problem List   Diagnosis Date Noted   Carotid stenosis, asymptomatic 08/22/2019   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) 06/08/2019   Essential hypertension 06/05/2019   Hyperlipidemia 06/05/2019   Carotid bruit 06/05/2019   Syncope and collapse 06/05/2019    Past Surgical History:  Procedure Laterality Date   ENDARTERECTOMY Left 08/22/2019    Procedure: LEFT CAROTID ENDARTERECTOMY WITH BOVIE PATCH ANGIOPLASTY;  Surgeon: Nada Libman, MD;  Location: [facility] OR;  Service: Vascular;  Laterality: Left;   LAPAROSCOPIC NEPHRECTOMY Right    NECK SURGERY     PROSTATE SURGERY         Family History  Problem Relation Age of Onset   Dementia Mother    Heart failure Father        Deteriration of the heart   Stroke Brother     Social History   Tobacco Use   Smoking status: Former   Smokeless tobacco: Never  Vaping Use   Vaping Use: Never used  Substance Use Topics   Alcohol use: Never   Drug use: Never    Home Medications Prior to Admission medications   Medication Sig Start Date End Date Taking? Authorizing Provider  AMBULATORY NON FORMULARY MEDICATION Medication Name: super beets    [provider]  amLODipine (NORVASC) 2.5 MG tablet TAKE 3 TABLETS (7.5 MG TOTAL) BY MOUTH DAILY. 11/19/19 08/25/20  Runell Gess, MD  aspirin EC 81 MG tablet Take 81 mg by mouth daily.    [provider]  irbesartan (AVAPRO) 150 MG tablet Take 1 tablet (150 mg total) by mouth daily. 09/11/20   Runell Gess, MD  Omega 3-6-9 Fatty Acids (OMEGA 3-6-9 COMPLEX PO) Take by mouth.    [provider]  rosuvastatin (CRESTOR) 10 MG tablet  Take 1 tablet (10 mg total) by mouth daily. 02/06/20 08/25/20  Runell Gess, MD  Valerian 450 MG CAPS Take by mouth.    [provider]  vitamin C (ASCORBIC ACID) 500 MG tablet Take 500 mg by mouth daily.    [provider]    Allergies    Patient has no known allergies.  Review of Systems   Review of Systems  Constitutional:  Negative for chills and fever.  HENT:  Negative for congestion and facial swelling.   Eyes:  Negative for discharge and visual disturbance.  Respiratory:  Negative for shortness of breath.   Cardiovascular:  Negative for chest pain and palpitations.  Gastrointestinal:  Negative for abdominal pain, diarrhea and vomiting.  Genitourinary:   Positive for flank pain.  Musculoskeletal:  Negative for arthralgias and myalgias.  Skin:  Negative for color change and rash.  Neurological:  Positive for syncope. Negative for tremors and headaches.  Psychiatric/Behavioral:  Negative for confusion and dysphoric mood.    Physical Exam Updated Vital Signs BP 136/66 (BP Location: Right Arm)   Pulse 79   Temp 98.6 F (37 C) (Oral)   Resp 20   Ht 5\' 7"  (1.702 m)   Wt 65.8 kg   SpO2 100%   BMI 22.71 kg/m   Physical Exam Vitals and nursing note reviewed.  Constitutional:      Appearance: He is well-developed.  HENT:     Head: Normocephalic and atraumatic.  Eyes:     Pupils: Pupils are equal, round, and reactive to light.  Neck:     Vascular: No JVD.  Cardiovascular:     Rate and Rhythm: Normal rate and regular rhythm.     Heart sounds: No murmur heard.   No friction rub. No gallop.  Pulmonary:     Effort: No respiratory distress.     Breath sounds: No wheezing.  Abdominal:     General: There is no distension.     Tenderness: There is no abdominal tenderness. There is no guarding or rebound.  Musculoskeletal:        General: Normal range of motion.     Cervical back: Normal range of motion and neck supple.  Skin:    Coloration: Skin is not pale.     Findings: No rash.  Neurological:     Mental Status: He is alert and oriented to person, place, and time.  Psychiatric:        Behavior: Behavior normal.    ED Results / Procedures / Treatments   Labs (all labs ordered are listed, but only abnormal results are displayed) Labs Reviewed  CBC WITH DIFFERENTIAL/PLATELET - Abnormal; Notable for the following components:      Result Value   WBC 10.6 (*)    Monocytes Absolute 1.2 (*)    All other components within normal limits  BASIC METABOLIC PANEL - Abnormal; Notable for the following components:   BUN 24 (*)    Creatinine, Ser 1.42 (*)    GFR, Estimated 50 (*)    All other components within normal limits     EKG None  Radiology CT Renal Stone Study  Result Date: 09/25/2020 CLINICAL DATA:  Flank pain, suspected kidney stone in an 80 year old male. EXAM: CT ABDOMEN AND PELVIS WITHOUT CONTRAST TECHNIQUE: Multidetector CT imaging of the abdomen and pelvis was performed following the standard protocol without IV contrast. COMPARISON:  None FINDINGS: Lower chest: Pulmonary granulomata and or calcified calcified pleural plaques at the lung bases. No  effusion. No consolidation. Basilar scarring or atelectasis. No effusion. Hepatobiliary: Cholelithiasis. Smooth contours of the liver. No pericholecystic stranding. Pancreas: Normal contour the pancreas without adjacent inflammation. Spleen: Normal Adrenals/Urinary Tract: Adrenal glands are normal. Post RIGHT nephrectomy. LEFT kidney without hydronephrosis, nephrolithiasis or perinephric stranding. No visible ureteral calculus. Smooth contour of the LEFT kidney. Stomach/Bowel: No acute gastrointestinal process. Appendix is normal. Vascular/Lymphatic: Aortic atherosclerosis. No sign of aneurysm. Smooth contour of the IVC. There is no gastrohepatic or hepatoduodenal ligament lymphadenopathy. No retroperitoneal or mesenteric lymphadenopathy. No pelvic sidewall lymphadenopathy. Reproductive: Unremarkable by CT. Other: No ascites. Musculoskeletal: Spinal degenerative changes without acute or destructive bone process. IMPRESSION: No acute intra-abdominal or pelvic pathology. No LEFT-sided nephrolithiasis or perinephric stranding. Post RIGHT nephrectomy. Cholelithiasis. Aortic Atherosclerosis (ICD10-I70.0). Electronically Signed   By: Donzetta Kohut M.D.   On: 09/25/2020 14:39    Procedures .1-3 Lead EKG Interpretation Performed by: Melene Plan, DO Authorized by: Melene Plan, DO     Interpretation: normal     ECG rate assessment: normal     Rhythm: sinus rhythm     Ectopy: none     Conduction: normal     Medications Ordered in ED Medications  sodium chloride 0.9 %  bolus 1,000 mL (0 mLs Intravenous Stopped 09/25/20 1455)    ED Course  I have reviewed the triage vital signs and the nursing notes.  Pertinent labs & imaging results that were available during my care of the patient were reviewed by me and considered in my medical decision making (see chart for details).    MDM Rules/Calculators/A&P                           79 yo M with a chief complaints of syncopal events.  The patient had another 1 of these about a week ago.  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  The patient was seated and suddenly felt unwell and then syncopized.  Most consistent with a vasovagal event.  Was hypotensive and bradycardic and then had spontaneous improvement.  We will obtain a laboratory evaluation.  He is having some right flank pain.  CT scan to evaluate for possible intra-abdominal pathology.  Bolus of IV fluids.  Reassess.  No significant electrolyte abnormality no significant anemia.  CT scan without concerning finding.  Still feeling well.  D/c home.   3:51 PM:  I have discussed the diagnosis/risks/treatment options with the patient and believe the pt to be eligible for discharge home to follow-up with PCP. We also discussed returning to the ED immediately if new or worsening sx occur. We discussed the sx which are most concerning (e.g., sudden worsening pain, fever, inability to tolerate by mouth) that necessitate immediate return. Medications administered to the patient during their visit and any new prescriptions provided to the patient are listed below.  Medications given during this visit Medications  sodium chloride 0.9 % bolus 1,000 mL (0 mLs Intravenous Stopped 09/25/20 1455)     The patient appears reasonably screen and/or stabilized for discharge and I doubt any other medical condition or other [facility] requiring further screening, evaluation, or treatment in the ED at this time prior to discharge.   Final Clinical Impression(s) / ED Diagnoses Final diagnoses:   Syncope and collapse    Rx / DC Orders ED Discharge Orders     None        Melene Plan, DO 09/25/20 1551

## 2020-09-25 NOTE — Telephone Encounter (Signed)
Patient daughter, Camelia Eng, called stating patient had a near syncope episode today. BP was 70/40 when EMS arrived. Slight bump in scr I have instructed patient to hold BP meds for the next two days, unless BP is >180 sysytolic. Resume BP medications but resume Irbesartan at 75mg  (1/2 tablet) Follow up on Friday

## 2020-09-25 NOTE — Discharge Instructions (Signed)
Call your family doctor today and discussed with them that you have had 2 events now since your blood pressures been changed.  Please eat and drink as well as you can for the next couple days.  Return for chest pain difficulty breathing headache.

## 2020-09-25 NOTE — ED Triage Notes (Signed)
Pt bib [facility] from home with complaints of near syncope. Pt was sitting down, started to feel dizzy and weak like he was going to pass out. Upon EMS arrival, bp was 70/40, pt was pale, and diaphoretic. Pt presents with no complaints and says he feels better now. EMS: 70/40, 48 HR, 22R, 98% RA, 111 CBG After NS- 146/78, 61 HR, 22 R

## 2020-10-03 ENCOUNTER — Other Ambulatory Visit: Payer: Self-pay

## 2020-10-03 ENCOUNTER — Ambulatory Visit (INDEPENDENT_AMBULATORY_CARE_PROVIDER_SITE_OTHER): Payer: Medicare Other | Admitting: Pharmacist

## 2020-10-03 VITALS — BP 140/70 | HR 72

## 2020-10-03 DIAGNOSIS — I6523 Occlusion and stenosis of bilateral carotid arteries: Secondary | ICD-10-CM | POA: Diagnosis not present

## 2020-10-03 DIAGNOSIS — I10 Essential (primary) hypertension: Secondary | ICD-10-CM | POA: Diagnosis not present

## 2020-10-03 LAB — BASIC METABOLIC PANEL
BUN/Creatinine Ratio: 15 (ref 10–24)
BUN: 18 mg/dL (ref 8–27)
CO2: 27 mmol/L (ref 20–29)
Calcium: 9.8 mg/dL (ref 8.6–10.2)
Chloride: 101 mmol/L (ref 96–106)
Creatinine, Ser: 1.19 mg/dL (ref 0.76–1.27)
Glucose: 107 mg/dL — ABNORMAL HIGH (ref 70–99)
Potassium: 4.3 mmol/L (ref 3.5–5.2)
Sodium: 142 mmol/L (ref 134–144)
eGFR: 61 mL/min/{1.73_m2} (ref >59)
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
## 2020-10-03 NOTE — Patient Instructions (Addendum)
Increase amlodipine to 10mg  daily (4 of the 2.5mg  tablets) Continue irbesartan 75mg  (1/2 tablet) daily  Continue checking blood pressure at home. Bring your log with you to your appointment  Call me at [phone] with any questions

## 2020-10-14 ENCOUNTER — Encounter: Payer: Medicare Other | Admitting: Neurology

## 2020-10-29 DIAGNOSIS — R7303 Prediabetes: Secondary | ICD-10-CM | POA: Insufficient documentation

## 2020-10-30 ENCOUNTER — Ambulatory Visit: Payer: Medicare Other

## 2020-11-05 ENCOUNTER — Ambulatory Visit: Payer: Medicare Other | Attending: Neurology | Admitting: Rehabilitative and Restorative Service Providers"

## 2020-11-05 ENCOUNTER — Encounter: Payer: Self-pay | Admitting: Rehabilitative and Restorative Service Providers"

## 2020-11-05 ENCOUNTER — Other Ambulatory Visit: Payer: Self-pay

## 2020-11-05 DIAGNOSIS — M6281 Muscle weakness (generalized): Secondary | ICD-10-CM | POA: Insufficient documentation

## 2020-11-05 DIAGNOSIS — R2689 Other abnormalities of gait and mobility: Secondary | ICD-10-CM | POA: Diagnosis present

## 2020-11-05 DIAGNOSIS — R278 Other lack of coordination: Secondary | ICD-10-CM | POA: Insufficient documentation

## 2020-11-05 DIAGNOSIS — M542 Cervicalgia: Secondary | ICD-10-CM | POA: Insufficient documentation

## 2020-11-05 DIAGNOSIS — M62838 Other muscle spasm: Secondary | ICD-10-CM | POA: Insufficient documentation
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
## 2020-11-05 NOTE — Patient Instructions (Signed)
Access Code: [email or web address] URL: [email or web address] Date: 11/05/2020 Prepared by: Clydie Braun Brason Berthelot  Exercises Seated Upper Trapezius Stretch - 2 x daily - 7 x weekly - 1 sets - 3 reps - 10 hold Seated Levator Scapulae Stretch - 1-2 x daily - 7 x weekly - 1 sets - 3 reps - 10 sec hold Seated Cervical Retraction - 2 x daily - 7 x weekly - 2 sets - 10 reps Seated Scapular Retraction - 2 x daily - 7 x weekly - 2 sets - 10 reps

## 2020-11-10 ENCOUNTER — Encounter: Payer: Self-pay | Admitting: Physical Therapy

## 2020-11-10 ENCOUNTER — Other Ambulatory Visit: Payer: Self-pay

## 2020-11-10 ENCOUNTER — Ambulatory Visit: Payer: Medicare Other | Admitting: Physical Therapy

## 2020-11-10 DIAGNOSIS — M62838 Other muscle spasm: Secondary | ICD-10-CM

## 2020-11-10 DIAGNOSIS — M6281 Muscle weakness (generalized): Secondary | ICD-10-CM | POA: Diagnosis not present

## 2020-11-10 DIAGNOSIS — M542 Cervicalgia: Secondary | ICD-10-CM

## 2020-11-10 DIAGNOSIS — R278 Other lack of coordination: Secondary | ICD-10-CM

## 2020-11-10 DIAGNOSIS — R2689 Other abnormalities of gait and mobility: Secondary | ICD-10-CM

## 2020-11-10 NOTE — Therapy (Signed)
[facility] Outpatient & Specialty Rehab @ [facility] [address] [place], [place], [address] Phone: [phone]   Fax:  [phone]  Physical Therapy Treatment  Patient Details  Name: Jonathan Savage MRN: [id number] Date of Birth: 07/14/1940 Referring Provider (PT): Dr Nita Sickle   Encounter Date: 11/10/2020   PT End of Session - 11/10/20 1136     Visit Number 2    Date for PT Re-Evaluation 12/26/20    Authorization Type Medicare/BCBS    PT Start Time 1140    PT Stop Time 1230    PT Time Calculation (min) 50 min    Activity Tolerance Patient tolerated treatment well    Behavior During Therapy [facility] for tasks assessed/performed             Past Medical History:  Diagnosis Date   Carotid artery occlusion    Complication of anesthesia    pt states following last surgery he was awake for over 24 hours following surgery   Hypertension    Stroke [facility])     Past Surgical History:  Procedure Laterality Date   ENDARTERECTOMY Left 08/22/2019   Procedure: LEFT CAROTID ENDARTERECTOMY WITH BOVIE PATCH ANGIOPLASTY;  Surgeon: Nada Libman, MD;  Location: [facility] OR;  Service: Vascular;  Laterality: Left;   LAPAROSCOPIC NEPHRECTOMY Right    NECK SURGERY     PROSTATE SURGERY      There were no vitals filed for this visit.   Subjective Assessment - 11/10/20 1141     Subjective Every morning I get up my neck hurts "like the dickens."    Pertinent History Essential HTN, Claudication in peripheral vascular disease, syncope and collapse possibly due to dehydration and hypotension    Currently in Pain? Yes    Pain Score 4     Pain Location Neck    Pain Descriptors / Indicators Aching                               [facility] Adult PT Treatment/Exercise - 11/10/20 0001       Neck Exercises: Seated   Other Seated Exercise Scapular retraction x10   Good return demo   Other Seated Exercise Upper trap Bil stretch 30 sec, levator stretch Bil 30  sec Good return demo      Neck Exercises: Supine   Neck Retraction 10 reps   Good return demo     Moist Heat Therapy   Number Minutes Moist Heat 20 Minutes    Moist Heat Location Cervical      Electrical Stimulation   Electrical Stimulation Location Cervical RT & LT    Electrical Stimulation Action Pre mod    Electrical Stimulation Parameters 80-150 HZ    Electrical Stimulation Goals Pain;Tone      Manual Therapy   Soft tissue mobilization Cervical: Rt > LT SCM, suboccipitals, SCM                       PT Short Term Goals - 11/10/20 1218       PT SHORT TERM GOAL #1   Title Pt will be ind with initial HEP.    Time 2    Period Weeks    Status Achieved    Target Date 07/24/19               PT Long Term Goals - 11/05/20 1255       PT LONG TERM  GOAL #1   Title Pt will be independent for advanced HEP.    Time 8    Period Weeks    Status New      PT LONG TERM GOAL #2   Title Pt will increase cervical ROM to [facility] to allow pt to look up, perform quick cervical mobility, and look behind him to drive without increased pain.    Time 8    Period Weeks    Status New      PT LONG TERM GOAL #3   Title Increase L shoulder strength to at least 4+/5 to allow him to lift objects into overhead cabinets.    Time 8    Period Weeks    Status New      PT LONG TERM GOAL #4   Title Pt to report at least a 60% improvement in symptoms to allow him to return to exercise class.    Time 8    Period Weeks      PT LONG TERM GOAL #5   Title Increase BERG to at least 50/56 to decrease his risk of falling.    Baseline 46/56    Time 8    Period Weeks    Status New                   Plan - 11/10/20 1137     Clinical Impression Statement Pt independent and compliant with initial HEP. HE reports no change in pain, worse in AM upon waking and then in the afternoon it seems to ramp up. Pt's resting tone thicker on the RT at [facility], proximal scalenes and sub  occipitals. this improved with manual work. Pt reported having good success in the past with Estsim, this was tried at end of session.    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, carotid stenosis    Examination-Activity Limitations Sleep;Reach Overhead    Examination-Participation Restrictions Community Activity;Driving    Stability/Clinical Decision Making Evolving/Moderate complexity    Rehab Potential Good    PT Frequency 2x / week    PT Duration 8 weeks    PT Treatment/Interventions ADLs/Self Care Home Management;Aquatic Therapy;Cryotherapy;Electrical Stimulation;Iontophoresis 4mg /ml Dexamethasone;Moist Heat;Traction;Ultrasound;Gait training;Stair training;Functional mobility training;Therapeutic exercise;Therapeutic activities;Balance training;Neuromuscular re-education;Patient/family education;Manual techniques    PT Next Visit Plan Pt woul dlike to try dry needling next session    PT Home Exercise Plan Access Code:    Consulted and Agree with Plan of Care Patient             Patient will benefit from skilled therapeutic intervention in order to improve the following deficits and impairments:  Decreased range of motion, Decreased endurance, Increased muscle spasms, Impaired UE functional use, Pain, Decreased balance, Impaired flexibility, Postural dysfunction, Decreased strength  Visit Diagnosis: Cervicalgia  Neck muscle spasm  Muscle weakness (generalized)  Other lack of coordination  Balance problem     Problem List Patient Active Problem List   Diagnosis Date Noted   Carotid stenosis, asymptomatic 08/22/2019   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

## 2020-11-12 ENCOUNTER — Other Ambulatory Visit: Payer: Self-pay

## 2020-11-12 ENCOUNTER — Encounter: Payer: Self-pay | Admitting: Rehabilitative and Restorative Service Providers"

## 2020-11-12 ENCOUNTER — Ambulatory Visit: Payer: Medicare Other | Admitting: Rehabilitative and Restorative Service Providers"

## 2020-11-12 DIAGNOSIS — M62838 Other muscle spasm: Secondary | ICD-10-CM

## 2020-11-12 DIAGNOSIS — M6281 Muscle weakness (generalized): Secondary | ICD-10-CM

## 2020-11-12 DIAGNOSIS — M542 Cervicalgia: Secondary | ICD-10-CM

## 2020-11-12 DIAGNOSIS — R278 Other lack of coordination: Secondary | ICD-10-CM

## 2020-11-12 DIAGNOSIS — R2689 Other abnormalities of gait and mobility: Secondary | ICD-10-CM

## 2020-11-12 NOTE — Therapy (Signed)
[facility] Outpatient [facility] Specialty Rehab @ [facility] [address] [place], [place], [address] Phone: [phone]   Fax:  [phone]  Physical Therapy Treatment  Patient Details  Name: Jonathan Savage MRN: [id number] Date of Birth: May 26, 1940 Referring Provider (PT): Dr Nita Sickle   Encounter Date: 11/12/2020   PT End of Session - 11/12/20 1010     Visit Number 3    Date for PT Re-Evaluation 12/26/20    Authorization Type Medicare/BCBS    PT Start Time 1008    PT Stop Time 1050    PT Time Calculation (min) 42 min    Activity Tolerance Patient tolerated treatment well    Behavior During Therapy [employer] for tasks assessed/performed             Past Medical History:  Diagnosis Date   Carotid artery occlusion    Complication of anesthesia    pt states following last surgery he was awake for over 24 hours following surgery   Hypertension    Stroke [facility])     Past Surgical History:  Procedure Laterality Date   ENDARTERECTOMY Left 08/22/2019   Procedure: LEFT CAROTID ENDARTERECTOMY WITH BOVIE PATCH ANGIOPLASTY;  Surgeon: Nada Libman, MD;  Location: [facility] OR;  Service: Vascular;  Laterality: Left;   LAPAROSCOPIC NEPHRECTOMY Right    NECK SURGERY     PROSTATE SURGERY      There were no vitals filed for this visit.   Subjective Assessment - 11/12/20 1010     Subjective Pt reports he is "about the same" but states that manual therapy last session helped.    Pertinent History Essential HTN, Claudication in peripheral vascular disease, syncope and collapse possibly due to dehydration and hypotension    Patient Stated Goals Pt wants to get his neck working well enough to go back to the gym to work out.    Currently in Pain? Yes    Pain Score 4     Pain Location Neck    Pain Orientation Mid;Lower    Pain Descriptors / Indicators Aching                               [facility] Adult PT Treatment/Exercise - 11/12/20 0001       Neck  Exercises: Machines for Strengthening   UBE (Upper Arm Bike) L2.0 x3 min each direction    Lat Pull 35# 2x10      Neck Exercises: Theraband   Rows 20 reps;Red    Shoulder External Rotation 20 reps;Red    Horizontal ABduction 20 reps;Red    Other Theraband Exercises shoulder extension 2x10 with red tband      Neck Exercises: Standing   Wall Push Ups 20 reps    Upper Extremity D2 Flexion;10 reps;Theraband    Theraband Level (UE D2) Level 2 (Red)    Other Standing Exercises 3 way scap stabilization on wall x5 each with green loop      Neck Exercises: Seated   Neck Retraction 10 reps;3 secs      Neck Exercises: Stretches   Upper Trapezius Stretch Right;Left;2 reps;20 seconds    Levator Stretch Right;Left;2 reps;20 seconds   required cuing for technique on stretch     Manual Therapy   Manual Therapy Soft tissue mobilization;Myofascial release;Manual Traction    Soft tissue mobilization cervical paraspinals, upper trap, levator    Myofascial Release manual trigger point release to B upper traps and cervical  paraspinals    Manual Traction 10 sec hold x3                       PT Short Term Goals - 11/10/20 1218       PT SHORT TERM GOAL #1   Title Pt will be ind with initial HEP.    Time 2    Period Weeks    Status Achieved    Target Date 07/24/19               PT Long Term Goals - 11/12/20 1104       PT LONG TERM GOAL #1   Title Pt will be independent for advanced HEP.    Status On-going      PT LONG TERM GOAL #2   Title Pt will increase cervical ROM to [facility] to allow pt to look up, perform quick cervical mobility, and look behind him to drive without increased pain.    Status On-going      PT LONG TERM GOAL #3   Title Increase L shoulder strength to at least 4+/5 to allow him to lift objects into overhead cabinets.    Status On-going      PT LONG TERM GOAL #4   Title Pt to report at least a 60% improvement in symptoms to allow him to return to  exercise class.    Status On-going      PT LONG TERM GOAL #5   Title Increase BERG to at least 50/56 to decrease his risk of falling.    Status On-going                   Plan - 11/12/20 1059     Clinical Impression Statement Pt reports feeling better following manual therapy last session.  Reviewed HEP and pt required cuing with levator stretch for improved technique.  Min cuing for cervical retraction in sitting for proper motion. Pt reports relief of pain with manual cervical traction and manual therapy. Only minimal trigger points noted during session today and able to release with manual trigger point release, so dry needling not performed during session today, but may be indicated at a later appointment. Pt tolerated ther ex well and did not report any increased pain with exercises during session.    PT Treatment/Interventions ADLs/Self Care Home Management;Aquatic Therapy;Cryotherapy;Electrical Stimulation;Iontophoresis 4mg /ml Dexamethasone;Moist Heat;Traction;Ultrasound;Gait training;Stair training;Functional mobility training;Therapeutic exercise;Therapeutic activities;Balance training;Neuromuscular re-education;Patient/family education;Manual techniques    PT Next Visit Plan manual therapy as indicated, strengthening, flexibility/postural control    Consulted and Agree with Plan of Care Patient             Patient will benefit from skilled therapeutic intervention in order to improve the following deficits and impairments:  Decreased range of motion, Decreased endurance, Increased muscle spasms, Impaired UE functional use, Pain, Decreased balance, Impaired flexibility, Postural dysfunction, Decreased strength  Visit Diagnosis: Cervicalgia  Neck muscle spasm  Muscle weakness (generalized)  Other lack of coordination  Balance problem     Problem List Patient Active Problem List   Diagnosis Date Noted   Carotid stenosis, asymptomatic 08/22/2019   Claudication  in peripheral vascular disease (HCC) 06/08/2019   Essential hypertension 06/05/2019   Hyperlipidemia 06/05/2019   Carotid bruit 06/05/2019   Syncope and collapse 06/05/2019    08/05/2019, PT, DPT 11/12/2020, 11:06 AM  [facility] Out[facility] @ [facility] [address] [place], [place], [place] Phone: [phone]   Fax:  [phone]  Name: Jonathan Savage MRN: [id number] Date of Birth: 04-Mar-1940

## 2020-11-17 ENCOUNTER — Other Ambulatory Visit: Payer: Self-pay

## 2020-11-17 ENCOUNTER — Ambulatory Visit: Payer: Medicare Other | Admitting: Physical Therapy

## 2020-11-17 ENCOUNTER — Encounter: Payer: Self-pay | Admitting: Physical Therapy

## 2020-11-17 DIAGNOSIS — M6281 Muscle weakness (generalized): Secondary | ICD-10-CM | POA: Diagnosis not present

## 2020-11-17 DIAGNOSIS — R2689 Other abnormalities of gait and mobility: Secondary | ICD-10-CM

## 2020-11-17 DIAGNOSIS — M542 Cervicalgia: Secondary | ICD-10-CM

## 2020-11-17 DIAGNOSIS — M62838 Other muscle spasm: Secondary | ICD-10-CM

## 2020-11-17 DIAGNOSIS — R278 Other lack of coordination: Secondary | ICD-10-CM

## 2020-11-17 NOTE — Therapy (Signed)
[facility] @ [facility] [address] [place], [place], [address] Phone: [phone]   Fax:  [phone]  Physical Therapy Treatment  Patient Details  Name: Jonathan Savage MRN: [id number] Date of Birth: 1940-06-14 Referring Provider (PT): Dr Nita Sickle   Encounter Date: 11/17/2020   PT End of Session - 11/17/20 1019     Visit Number 4    Date for PT Re-Evaluation 12/26/20    Authorization Type Medicare/BCBS    PT Start Time 1016    PT Stop Time 1056    PT Time Calculation (min) 40 min    Activity Tolerance Patient tolerated treatment well    Behavior During Therapy [facility] for tasks assessed/performed             Past Medical History:  Diagnosis Date   Carotid artery occlusion    Complication of anesthesia    pt states following last surgery he was awake for over 24 hours following surgery   Hypertension    Stroke [facility])     Past Surgical History:  Procedure Laterality Date   ENDARTERECTOMY Left 08/22/2019   Procedure: LEFT CAROTID ENDARTERECTOMY WITH BOVIE PATCH ANGIOPLASTY;  Surgeon: Nada Libman, MD;  Location: [facility] OR;  Service: Vascular;  Laterality: Left;   LAPAROSCOPIC NEPHRECTOMY Right    NECK SURGERY     PROSTATE SURGERY      There were no vitals filed for this visit.   Subjective Assessment - 11/17/20 1021     Subjective Initially sore after my last workout but last 2 mornings have been "better."    Pertinent History Essential HTN, Claudication in peripheral vascular disease, syncope and collapse possibly due to dehydration and hypotension    Currently in Pain? Yes   neck and shoulders are a little achey   Pain Score 3     Pain Orientation Right;Left                               [facility] Adult PT Treatment/Exercise - 11/17/20 0001       Neck Exercises: Machines for Strengthening   UBE (Upper Arm Bike) L1 3 min reverse    Lat Pull 35# 2x10   VC to pull a little closer to chest      Neck Exercises: Theraband   Shoulder Extension 20 reps;Red    Shoulder Extension Limitations VC to slow speed, keep arm straight, relax UT    Rows 20 reps;Green    Shoulder External Rotation 20 reps;Red    Shoulder External Rotation Limitations VC for posture/position of head    Horizontal ABduction 20 reps;Red    Horizontal ABduction Limitations VC to relax and not over muscle the movement with upper trap      Neck Exercises: Standing   Wall Push Ups 20 reps    Other Standing Exercises 3 way scap stabilization on wall x10 each with green loop      Manual Therapy   Manual Therapy Soft tissue mobilization;Myofascial release;Manual Traction    Soft tissue mobilization cervical paraspinals, upper trap, levator                       PT Short Term Goals - 11/10/20 1218       PT SHORT TERM GOAL #1   Title Pt will be ind with initial HEP.    Time 2    Period Weeks  Status Achieved    Target Date 07/24/19               PT Long Term Goals - 11/12/20 1104       PT LONG TERM GOAL #1   Title Pt will be independent for advanced HEP.    Status On-going      PT LONG TERM GOAL #2   Title Pt will increase cervical ROM to [facility] to allow pt to look up, perform quick cervical mobility, and look behind him to drive without increased pain.    Status On-going      PT LONG TERM GOAL #3   Title Increase L shoulder strength to at least 4+/5 to allow him to lift objects into overhead cabinets.    Status On-going      PT LONG TERM GOAL #4   Title Pt to report at least a 60% improvement in symptoms to allow him to return to exercise class.    Status On-going      PT LONG TERM GOAL #5   Title Increase BERG to at least 50/56 to decrease his risk of falling.    Status On-going                   Plan - 11/17/20 1026     Clinical Impression Statement Pt reports initial muscle soreness after last visit but the next 2 days he awoke with less neck pain and  stiffness. Pt requires verbal cuing to relax bil upper traps when performing his resistive exercises. Once attention was brought to this pt improved his tecnhique.    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 [place], [place], [address] Phone: [phone]   Fax:  [phone]  Name: Jonathan Savage MRN: [id number] Date of Birth: 1940-11-13

## 2020-11-19 ENCOUNTER — Encounter: Payer: Self-pay | Admitting: Rehabilitative and Restorative Service Providers"

## 2020-11-19 ENCOUNTER — Ambulatory Visit: Payer: Medicare Other | Admitting: Rehabilitative and Restorative Service Providers"

## 2020-11-19 ENCOUNTER — Other Ambulatory Visit: Payer: Self-pay

## 2020-11-19 DIAGNOSIS — M542 Cervicalgia: Secondary | ICD-10-CM

## 2020-11-19 DIAGNOSIS — R278 Other lack of coordination: Secondary | ICD-10-CM

## 2020-11-19 DIAGNOSIS — M6281 Muscle weakness (generalized): Secondary | ICD-10-CM

## 2020-11-19 DIAGNOSIS — R2689 Other abnormalities of gait and mobility: Secondary | ICD-10-CM

## 2020-11-19 DIAGNOSIS — M62838 Other muscle spasm: Secondary | ICD-10-CM

## 2020-11-19 NOTE — Therapy (Signed)
[facility] Out[facility] [address] [place], [place], [address] Phone: [phone]   Fax:  [phone]  Physical Therapy Treatment  Patient Details  Name: Jonathan Savage MRN: [id number] Date of Birth: 05/05/1940 Referring Provider (PT): Dr Jonathan Savage   Encounter Date: 11/19/2020   PT End of Session - 11/19/20 1206     Visit Number 5    Date for PT Re-Evaluation 12/26/20    Authorization Type Medicare/BCBS    PT Start Time 1200    PT Stop Time 1245    PT Time Calculation (min) 45 min    Activity Tolerance Patient tolerated treatment well    Behavior During Therapy Jonathan Savage for tasks assessed/performed             Past Medical History:  Diagnosis Date   Carotid artery occlusion    Complication of anesthesia    pt states following last surgery he was awake for over 24 hours following surgery   Hypertension    Stroke [facility])     Past Surgical History:  Procedure Laterality Date   ENDARTERECTOMY Left 08/22/2019   Procedure: LEFT CAROTID ENDARTERECTOMY WITH BOVIE PATCH ANGIOPLASTY;  Surgeon: Jonathan Libman, MD;  Location: [facility] OR;  Service: Vascular;  Laterality: Left;   LAPAROSCOPIC NEPHRECTOMY Right    NECK SURGERY     PROSTATE SURGERY      There were no vitals filed for this visit.   Subjective Assessment - 11/19/20 1205     Subjective Pt reports soreness following last session, but states that his pain primarily depends on how he sleeps the night before.    Pertinent History Essential HTN, Claudication in peripheral vascular disease, syncope and collapse possibly due to dehydration and hypotension    Patient Stated Goals Pt wants to get his neck working well enough to go back to the gym to work out.    Currently in Pain? Yes    Pain Score 3     Pain Location Neck    Pain Orientation Lower    Pain Descriptors / Indicators Aching    Pain Type Chronic pain                               [facility]  Adult PT Treatment/Exercise - 11/19/20 0001       Neck Exercises: Machines for Strengthening   Nustep L6 x6 min.  PT present to discuss status.      Neck Exercises: Theraband   Shoulder Extension 20 reps;Red    Rows 20 reps;Red    Shoulder External Rotation 20 reps;Red    Horizontal ABduction 20 reps;Red      Neck Exercises: Standing   Upper Extremity D2 Flexion;10 reps;Theraband    Theraband Level (UE D2) Level 2 (Red)    Other Standing Exercises 3 way scap stabilization on wall x10 each with green loop      Manual Therapy   Manual Therapy Soft tissue mobilization;Myofascial release    Manual therapy comments in prone    Soft tissue mobilization cervical paraspinals, upper trap, levator    Myofascial Release trigger point release to B upper traps and cervical paraspinals.  Performed skilled palpation to locate trigger points in B side.              Trigger Point Dry Needling - 11/19/20 0001     Consent Given? Yes    Education Handout Provided Yes  Muscles Treated Head and Neck Upper trapezius;Cervical multifidi    Upper Trapezius Response Twitch reponse elicited;Palpable increased muscle length    Cervical multifidi Response Twitch reponse elicited;Palpable increased muscle length                   PT Education - 11/19/20 1242     Education Details Pt educated in dry needling and he was agreeable to treatment.    Person(s) Educated Patient    Methods Explanation;Handout    Comprehension Verbalized understanding              PT Short Term Goals - 11/10/20 1218       PT SHORT TERM GOAL #1   Title Pt will be ind with initial HEP.    Time 2    Period Weeks    Status Achieved    Target Date 07/24/19               PT Long Term Goals - 11/19/20 1253       PT LONG TERM GOAL #1   Title Pt will be independent for advanced HEP.    Status On-going      PT LONG TERM GOAL #2   Title Pt will increase cervical ROM to [facility] to allow pt to look up,  perform quick cervical mobility, and look behind him to drive without increased pain.    Status On-going      PT LONG TERM GOAL #3   Title Increase L shoulder strength to at least 4+/5 to allow him to lift objects into overhead cabinets.    Status On-going      PT LONG TERM GOAL #4   Title Pt to report at least a 60% improvement in symptoms to allow him to return to exercise class.    Status On-going   Pt reports that he is considering trying to go work out again.                  Plan - 11/19/20 1242     Clinical Impression Statement Jonathan Savage continues to have muscle soreness following previous PT session, but admits that he had not been exercising his upper body in some time before starting PT. Pt only requires occasional min cuing for posture and relaxing shoulders during session.  Pt with tightness and trigger points noted in B cervical multifidi and upper traps and was agreeable to dry needling.  Following dry needling, pt able to have increased cervical ROM noted and he reported feelng some looser.    PT Treatment/Interventions ADLs/Self Care Home Management;Aquatic Therapy;Cryotherapy;Electrical Stimulation;Iontophoresis 4mg /ml Dexamethasone;Moist Heat;Traction;Ultrasound;Gait training;Stair training;Functional mobility training;Therapeutic exercise;Therapeutic activities;Balance training;Neuromuscular re-education;Patient/family education;Manual techniques    PT Next Visit Plan add balance exercises, manual therapy as indicated, strengthening, flexibility/postural control    Consulted and Agree with Plan of Care Patient             Patient will benefit from skilled therapeutic intervention in order to improve the following deficits and impairments:  Decreased range of motion, Decreased endurance, Increased muscle spasms, Impaired UE functional use, Pain, Decreased balance, Impaired flexibility, Postural dysfunction, Decreased strength  Visit  Diagnosis: Cervicalgia  Neck muscle spasm  Muscle weakness (generalized)  Other lack of coordination  Balance problem     Problem List Patient Active Problem List   Diagnosis Date Noted   Carotid stenosis, asymptomatic 08/22/2019   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

## 2020-11-19 NOTE — Patient Instructions (Signed)

## 2020-11-24 ENCOUNTER — Ambulatory Visit: Payer: Medicare Other | Admitting: Physical Therapy

## 2020-11-24 ENCOUNTER — Other Ambulatory Visit: Payer: Self-pay

## 2020-11-24 DIAGNOSIS — M542 Cervicalgia: Secondary | ICD-10-CM

## 2020-11-24 DIAGNOSIS — M6281 Muscle weakness (generalized): Secondary | ICD-10-CM

## 2020-11-24 DIAGNOSIS — M62838 Other muscle spasm: Secondary | ICD-10-CM

## 2020-11-24 DIAGNOSIS — R278 Other lack of coordination: Secondary | ICD-10-CM

## 2020-11-24 NOTE — Therapy (Signed)
[facility] Out[facility]ty Re[facility] [address] [place], [place], [address] Phone: [phone]   Fax:  [phone]  Physical Therapy Treatment  Patient Details  Name: Jonathan Savage MRN: [id number] Date of Birth: 17-Sep-1940 Referring Provider (PT): Dr Nita Sickle   Encounter Date: 11/24/2020   PT End of Session - 11/24/20 1017     Visit Number 6    Date for PT Re-Evaluation 12/26/20    Authorization Type Medicare/BCBS    PT Start Time 1016    PT Stop Time 1100    PT Time Calculation (min) 44 min    Activity Tolerance Patient tolerated treatment well    Behavior During Therapy [facility] for tasks assessed/performed             Past Medical History:  Diagnosis Date   Carotid artery occlusion    Complication of anesthesia    pt states following last surgery he was awake for over 24 hours following surgery   Hypertension    Stroke [facility])     Past Surgical History:  Procedure Laterality Date   ENDARTERECTOMY Left 08/22/2019   Procedure: LEFT CAROTID ENDARTERECTOMY WITH BOVIE PATCH ANGIOPLASTY;  Surgeon: Nada Libman, MD;  Location: [facility] OR;  Service: Vascular;  Laterality: Left;   LAPAROSCOPIC NEPHRECTOMY Right    NECK SURGERY     PROSTATE SURGERY      There were no vitals filed for this visit.   Subjective Assessment - 11/24/20 1020     Subjective No adverse reactions to the dry needling. Normal pain this AM: 4/10 upon waking, constant.    Pertinent History Essential HTN, Claudication in peripheral vascular disease, syncope and collapse possibly due to dehydration and hypotension    Currently in Pain? Yes    Pain Score 4     Pain Location Neck    Pain Orientation Lower    Pain Descriptors / Indicators Dull;Sore    Aggravating Factors  Upon waking up in the AM    Pain Relieving Factors Manual work feels good                               [facility] Adult PT Treatment/Exercise - 11/24/20 0001       Neck  Exercises: Machines for Strengthening   UBE (Upper Arm Bike) L1 3x3 with PTA present to monitor.      Neck Exercises: Theraband   Shoulder Extension 20 reps;Red    Shoulder Extension Limitations tandem stance for balance challenge   VC for technique   Rows 20 reps;Red    Rows Limitations performed in tandem stance for balance challenge    Shoulder External Rotation 20 reps;Red    Horizontal ABduction 20 reps;Red      Neck Exercises: Standing   Upper Extremity D2 Flexion;10 reps;Theraband    Theraband Level (UE D2) Level 2 (Red)    Other Standing Exercises 3 way scap stabilization on wall x10 each with green loop   Pt reports these ex are the most challenging to do.     Manual Therapy   Manual Therapy Soft tissue mobilization;Myofascial release;Manual Traction    Soft tissue mobilization cervical paraspinals, upper trap, levator                       PT Short Term Goals - 11/10/20 1218       PT SHORT TERM GOAL #1   Title  Pt will be ind with initial HEP.    Time 2    Period Weeks    Status Achieved    Target Date 07/24/19               PT Long Term Goals - 11/19/20 1253       PT LONG TERM GOAL #1   Title Pt will be independent for advanced HEP.    Status On-going      PT LONG TERM GOAL #2   Title Pt will increase cervical ROM to [facility] to allow pt to look up, perform quick cervical mobility, and look behind him to drive without increased pain.    Status On-going      PT LONG TERM GOAL #3   Title Increase L shoulder strength to at least 4+/5 to allow him to lift objects into overhead cabinets.    Status On-going      PT LONG TERM GOAL #4   Title Pt to report at least a 60% improvement in symptoms to allow him to return to exercise class.    Status On-going   Pt reports that he is considering trying to go work out again.                  Plan - 11/24/20 1018     Clinical Impression Statement Pt reports overall pain about the same, no  increased pain or soreness with the dry needling. Pt had no issues with exercises, pt did had thickness to Bil SCM with noted trigger points Lt > RT. occipitals started thick and repsonded  well to manual work.    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, carotid stenosis    Examination-Activity Limitations Sleep;Reach Overhead    Examination-Participation Restrictions Community Activity;Driving    Stability/Clinical Decision Making Evolving/Moderate complexity    Rehab Potential Good    PT Duration 8 weeks    PT Treatment/Interventions ADLs/Self Care Home Management;Aquatic Therapy;Cryotherapy;Electrical Stimulation;Iontophoresis 4mg /ml Dexamethasone;Moist Heat;Traction;Ultrasound;Gait training;Stair training;Functional mobility training;Therapeutic exercise;Therapeutic activities;Balance training;Neuromuscular re-education;Patient/family education;Manual techniques    PT Next Visit Plan DN #2, manual woork, postural strength and endurance    PT Home Exercise Plan Access Code:    Consulted and Agree with Plan of Care Patient             Patient will benefit from skilled therapeutic intervention in order to improve the following deficits and impairments:  Decreased range of motion, Decreased endurance, Increased muscle spasms, Impaired UE functional use, Pain, Decreased balance, Impaired flexibility, Postural dysfunction, Decreased strength  Visit Diagnosis: Cervicalgia  Neck muscle spasm  Muscle weakness (generalized)  Other lack of coordination     Problem List Patient Active Problem List   Diagnosis Date Noted   Carotid stenosis, asymptomatic 08/22/2019   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

## 2020-12-01 ENCOUNTER — Encounter: Payer: Self-pay | Admitting: Physical Therapy

## 2020-12-01 ENCOUNTER — Ambulatory Visit: Payer: Medicare Other | Admitting: Physical Therapy

## 2020-12-01 ENCOUNTER — Other Ambulatory Visit: Payer: Self-pay

## 2020-12-01 VITALS — BP 150/75

## 2020-12-01 DIAGNOSIS — R278 Other lack of coordination: Secondary | ICD-10-CM

## 2020-12-01 DIAGNOSIS — M6281 Muscle weakness (generalized): Secondary | ICD-10-CM | POA: Diagnosis not present

## 2020-12-01 DIAGNOSIS — M542 Cervicalgia: Secondary | ICD-10-CM

## 2020-12-01 DIAGNOSIS — R2689 Other abnormalities of gait and mobility: Secondary | ICD-10-CM

## 2020-12-01 DIAGNOSIS — M62838 Other muscle spasm: Secondary | ICD-10-CM

## 2020-12-01 NOTE — Therapy (Signed)
[facility] Outpatient [facility]ty Re[facility] [address] [place], [place], [address] Phone: [phone]   Fax:  [phone]  Physical Therapy Treatment  Patient Details  Name: Jonathan Savage MRN: [id number] Date of Birth: 1940-06-09 Referring Provider (PT): Dr Nita Sickle   Encounter Date: 12/01/2020   PT End of Session - 12/01/20 1015     Visit Number 7    Date for PT Re-Evaluation 12/26/20    Authorization Type Medicare/BCBS    PT Start Time 1015    PT Stop Time 1100    PT Time Calculation (min) 45 min    Activity Tolerance Patient tolerated treatment well    Behavior During Therapy [facility] for tasks assessed/performed             Past Medical History:  Diagnosis Date   Carotid artery occlusion    Complication of anesthesia    pt states following last surgery he was awake for over 24 hours following surgery   Hypertension    Stroke [facility])     Past Surgical History:  Procedure Laterality Date   ENDARTERECTOMY Left 08/22/2019   Procedure: LEFT CAROTID ENDARTERECTOMY WITH BOVIE PATCH ANGIOPLASTY;  Surgeon: Nada Libman, MD;  Location: [facility] OR;  Service: Vascular;  Laterality: Left;   LAPAROSCOPIC NEPHRECTOMY Right    NECK SURGERY     PROSTATE SURGERY      Vitals:   12/01/20 1015  BP: (!) 150/75  SpO2: 98%     Subjective Assessment - 12/01/20 1015     Subjective My neck is a little better but this pain I have on my 'side" is really keeping me from sleeping well. When I move, walk around it doesn't bother me.    Pertinent History Essential HTN, Claudication in peripheral vascular disease, syncope and collapse possibly due to dehydration and hypotension    Currently in Pain? Yes    Pain Score 3     Pain Location Neck    Pain Descriptors / Indicators Dull;Constant    Aggravating Factors  Upon walking in AM    Pain Relieving Factors Exercise, stretching, heat, DN, massage                               [facility]  Adult PT Treatment/Exercise - 12/01/20 0001       Neck Exercises: Machines for Strengthening   UBE (Upper Arm Bike) L 1.8 3x3 with PTA present to discuss status    Cybex Row 15# 2x10    Lat Pull 35# 2x10   VC to pull a little closer to chest     Neck Exercises: Theraband   Rows --   2x15 green band standing in tandem stance     Neck Exercises: Standing   Other Standing Exercises counter top push ups 2x15      Manual Therapy   Manual Therapy Soft tissue mobilization;Myofascial release;Manual Traction    Soft tissue mobilization cervical paraspinals, upper trap, levator                       PT Short Term Goals - 11/10/20 1218       PT SHORT TERM GOAL #1   Title Pt will be ind with initial HEP.    Time 2    Period Weeks    Status Achieved    Target Date 07/24/19  PT Long Term Goals - 11/19/20 1253       PT LONG TERM GOAL #1   Title Pt will be independent for advanced HEP.    Status On-going      PT LONG TERM GOAL #2   Title Pt will increase cervical ROM to [facility] to allow pt to look up, perform quick cervical mobility, and look behind him to drive without increased pain.    Status On-going      PT LONG TERM GOAL #3   Title Increase L shoulder strength to at least 4+/5 to allow him to lift objects into overhead cabinets.    Status On-going      PT LONG TERM GOAL #4   Title Pt to report at least a 60% improvement in symptoms to allow him to return to exercise class.    Status On-going   Pt reports that he is considering trying to go work out again.                  Plan - 12/01/20 1058     Clinical Impression Statement Pt reports neck pain might be slightly better but he also is dealing with 'side/back" pain that keeps him from sleeping. He reports having a previous scan to this area which showed nothing. PTA encouraged him to speak to his MD about it. He is planning on seeing the MD today due to increased BP issues. About half  way through todays session pt reports getting dizzy and requested to sit. PTA took BP: 150/70-75. Pt reports it has been fluctuating a lot lately. He plans to have MD office take when he gets there.    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, carotid stenosis    Examination-Activity Limitations Sleep;Reach Overhead    Examination-Participation Restrictions Community Activity;Driving    Stability/Clinical Decision Making Evolving/Moderate complexity    Rehab Potential Good    PT Frequency 2x / week    PT Duration 8 weeks    PT Treatment/Interventions ADLs/Self Care Home Management;Aquatic Therapy;Cryotherapy;Electrical Stimulation;Iontophoresis 4mg /ml Dexamethasone;Moist Heat;Traction;Ultrasound;Gait training;Stair training;Functional mobility training;Therapeutic exercise;Therapeutic activities;Balance training;Neuromuscular re-education;Patient/family education;Manual techniques    PT Next Visit Plan DN #2, manual woork, postural strength and endurance, follow up with blood preesure issue    PT Home Exercise Plan Access Code:    Consulted and Agree with Plan of Care Patient             Patient will benefit from skilled therapeutic intervention in order to improve the following deficits and impairments:  Decreased range of motion, Decreased endurance, Increased muscle spasms, Impaired UE functional use, Pain, Decreased balance, Impaired flexibility, Postural dysfunction, Decreased strength  Visit Diagnosis: Cervicalgia  Neck muscle spasm  Muscle weakness (generalized)  Other lack of coordination  Balance problem     Problem List Patient Active Problem List   Diagnosis Date Noted   Carotid stenosis, asymptomatic 08/22/2019   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 [place], [place], [place] Phone: [phone]   Fax:  [phone]  Name: Hasnain Manheim MRN: [name] Date of Birth: 10-22-1940

## 2020-12-03 ENCOUNTER — Encounter: Payer: Self-pay | Admitting: Rehabilitative and Restorative Service Providers"

## 2020-12-03 ENCOUNTER — Ambulatory Visit: Payer: Medicare Other | Admitting: Rehabilitative and Restorative Service Providers"

## 2020-12-03 ENCOUNTER — Other Ambulatory Visit: Payer: Self-pay

## 2020-12-03 DIAGNOSIS — M542 Cervicalgia: Secondary | ICD-10-CM

## 2020-12-03 DIAGNOSIS — M6281 Muscle weakness (generalized): Secondary | ICD-10-CM

## 2020-12-03 DIAGNOSIS — M62838 Other muscle spasm: Secondary | ICD-10-CM

## 2020-12-03 DIAGNOSIS — R2689 Other abnormalities of gait and mobility: Secondary | ICD-10-CM

## 2020-12-03 NOTE — Therapy (Signed)
[facility] @ [address] [place], [place], [address] Phone: [phone]   Fax:  [phone]  Physical Therapy Treatment  Patient Details  Name: Jonathan Savage MRN: [id number] Date of Birth: 1940/08/10 Referring Provider (PT): Dr Narda Amber   Encounter Date: 12/03/2020   PT End of Session - 12/03/20 1130     Visit Number 8    Date for PT Re-Evaluation 12/26/20    Authorization Type Medicare/BCBS    PT Start Time 1055    PT Stop Time 1035    PT Time Calculation (min) 1420 min    Activity Tolerance Patient tolerated treatment well    Behavior During Therapy [facility] for tasks assessed/performed             Past Medical History:  Diagnosis Date   Carotid artery occlusion    Complication of anesthesia    pt states following last surgery he was awake for over 24 hours following surgery   Hypertension    Stroke [facility])     Past Surgical History:  Procedure Laterality Date   ENDARTERECTOMY Left 08/22/2019   Procedure: LEFT CAROTID ENDARTERECTOMY WITH BOVIE PATCH ANGIOPLASTY;  Surgeon: Serafina Mitchell, MD;  Location: [facility] OR;  Service: Vascular;  Laterality: Left;   LAPAROSCOPIC NEPHRECTOMY Right    NECK SURGERY     PROSTATE SURGERY      There were no vitals filed for this visit.   Subjective Assessment - 12/03/20 1129     Subjective Pt reports that he was able to start exercise class at gym again.    Patient Stated Goals Pt wants to get his neck working well enough to go back to the gym to work out.    Currently in Pain? Yes    Pain Score 4     Pain Location Neck    Pain Orientation Lower    Pain Descriptors / Indicators Constant;Aching    Pain Type Chronic pain                [facility] PT Assessment - 12/03/20 0001       Observation/Other Assessments   Focus on Therapeutic Outcomes (FOTO)  66%      AROM   AROM Assessment Site Cervical    Cervical Flexion 60    Cervical Extension 30    Cervical  - Right Side Bend 20    Cervical - Left Side Bend 20    Cervical - Right Rotation 40    Cervical - Left Rotation 40                           [facility] Adult PT Treatment/Exercise - 12/03/20 0001       High Level Balance   High Level Balance Activities Tandem walking      Neck Exercises: Machines for Strengthening   UBE (Upper Arm Bike) L1.8 x3 min each way.  PT present to discuss progress.    Cybex Row 15# 2x10    Lat Pull 35# 2x10      Neck Exercises: Standing   Wall Push Ups 15 reps   2x15   Wall Push Ups Limitations from countertop    Other Standing Exercises Step ups and lateral step up and over on bosu. x10B each.  Sit to/from stand on blue foam with 5# kettlebell x10 with chest press, x10 with overhead press.  PT Short Term Goals - 11/10/20 1218       PT SHORT TERM GOAL #1   Title Pt will be ind with initial HEP.    Time 2    Period Weeks    Status Achieved    Target Date 07/24/19               PT Long Term Goals - 12/03/20 1200       PT LONG TERM GOAL #1   Title Pt will be independent for advanced HEP.    Status Partially Met      PT LONG TERM GOAL #2   Title Pt will increase cervical ROM to [facility] to allow pt to look up, perform quick cervical mobility, and look behind him to drive without increased pain.    Status Achieved      PT LONG TERM GOAL #3   Title Increase L shoulder strength to at least 4+/5 to allow him to lift objects into overhead cabinets.    Status Partially Met      PT LONG TERM GOAL #4   Title Pt to report at least a 60% improvement in symptoms to allow him to return to exercise class.    Status Achieved      PT LONG TERM GOAL #5   Title Increase BERG to at least 50/56 to decrease his risk of falling.    Status On-going                   Plan - 12/03/20 1157     Clinical Impression Statement Pt reports that he knows that he will always have some level of neck pain, but  he was pleased that he was able to return to the exercise class. Pt with improved FOTO score and has exceeded projected value. Pt states that they have changed some of his BP medications and he hopes that this will help with his uncontrolled BP issues. Pt with difficulty with balance exercises and requires UE support with bosu ball step ups, primarily.    PT Treatment/Interventions ADLs/Self Care Home Management;Aquatic Therapy;Cryotherapy;Electrical Stimulation;Iontophoresis 51m/ml Dexamethasone;Moist Heat;Traction;Ultrasound;Gait training;Stair training;Functional mobility training;Therapeutic exercise;Therapeutic activities;Balance training;Neuromuscular re-education;Patient/family education;Manual techniques    PT Next Visit Plan balance, manual work, postural strength and endurance, follow up with blood preesure issue    Consulted and Agree with Plan of Care Patient             Patient will benefit from skilled therapeutic intervention in order to improve the following deficits and impairments:  Decreased range of motion, Decreased endurance, Increased muscle spasms, Impaired UE functional use, Pain, Decreased balance, Impaired flexibility, Postural dysfunction, Decreased strength  Visit Diagnosis: Cervicalgia  Neck muscle spasm  Muscle weakness (generalized)  Balance problem     Problem List Patient Active Problem List   Diagnosis Date Noted   Carotid stenosis, asymptomatic 08/22/2019   Claudication in peripheral vascular disease ([place] 06/08/2019   Essential hypertension 06/05/2019   Hyperlipidemia 06/05/2019   Carotid bruit 06/05/2019   Syncope and collapse 06/05/2019    SJuel Burrow PT, DPT 12/03/2020, 12:01 PM  [email or web address] [address]Phone: [phone]  Fax:  [phone] Name: Jonathan MoffaMRN: [id number] of Birth: 7Jan 01, 1943

## 2020-12-08 ENCOUNTER — Other Ambulatory Visit: Payer: Self-pay

## 2020-12-08 ENCOUNTER — Ambulatory Visit: Payer: Medicare Other | Attending: Neurology | Admitting: Physical Therapy

## 2020-12-08 ENCOUNTER — Encounter: Payer: Self-pay | Admitting: Physical Therapy

## 2020-12-08 DIAGNOSIS — R278 Other lack of coordination: Secondary | ICD-10-CM | POA: Insufficient documentation

## 2020-12-08 DIAGNOSIS — M542 Cervicalgia: Secondary | ICD-10-CM | POA: Diagnosis present

## 2020-12-08 DIAGNOSIS — M62838 Other muscle spasm: Secondary | ICD-10-CM | POA: Diagnosis present

## 2020-12-08 DIAGNOSIS — R2689 Other abnormalities of gait and mobility: Secondary | ICD-10-CM | POA: Insufficient documentation

## 2020-12-08 DIAGNOSIS — M6281 Muscle weakness (generalized): Secondary | ICD-10-CM | POA: Diagnosis present

## 2020-12-08 NOTE — Therapy (Signed)
[facility] @ [address] [place], [place], [address] Phone: [phone]   Fax:  [phone]  Physical Therapy Treatment  Patient Details  Name: Jonathan Savage MRN: [id number] Date of Birth: 1940/05/18 Referring Provider (PT): Dr Narda Amber   Encounter Date: 12/08/2020   PT End of Session - 12/08/20 1007     Visit Number 9    Date for PT Re-Evaluation 12/26/20    Authorization Type Medicare/BCBS    PT Start Time 1007    PT Stop Time 1050    PT Time Calculation (min) 43 min    Activity Tolerance Patient tolerated treatment well    Behavior During Therapy [facility] for tasks assessed/performed             Past Medical History:  Diagnosis Date   Carotid artery occlusion    Complication of anesthesia    pt states following last surgery he was awake for over 24 hours following surgery   Hypertension    Stroke [facility])     Past Surgical History:  Procedure Laterality Date   ENDARTERECTOMY Left 08/22/2019   Procedure: LEFT CAROTID ENDARTERECTOMY WITH BOVIE PATCH ANGIOPLASTY;  Surgeon: Serafina Mitchell, MD;  Location: [facility] OR;  Service: Vascular;  Laterality: Left;   LAPAROSCOPIC NEPHRECTOMY Right    NECK SURGERY     PROSTATE SURGERY      There were no vitals filed for this visit.   Subjective Assessment - 12/08/20 1010     Subjective Still working on blood pressure meds. " i feel like I'm 79 today."    Pertinent History Essential HTN, Claudication in peripheral vascular disease, syncope and collapse possibly due to dehydration and hypotension    Currently in Pain? Yes    Pain Score 4     Pain Location Neck    Pain Descriptors / Indicators Constant;Aching    Aggravating Factors  In the AM    Pain Relieving Factors Exercise, heat, masssage    Multiple Pain Sites No                               [facility] Adult PT Treatment/Exercise - 12/08/20 0001       Neuro Re-ed    Neuro Re-ed Details   Tandem walking 15 feet 6x, high kne marchig 15 ft 4x no UE SBA only      Neck Exercises: Machines for Strengthening   UBE (Upper Arm Bike) L1.8 x3 min each way.  PTA present to discuss progress.    Nustep L2 x 5 min    Cybex Row 15# 2x15    Lat Pull 35# 2x15      Neck Exercises: Standing   Wall Push Ups 15 reps   2x15   Wall Push Ups Limitations from countertop   5# upright row   Other Standing Exercises BOSU step ups front 10x with 30 sec Bil LE balance on last rep: Then side steps 10x Bil with static balance ontop 30 sec                       PT Short Term Goals - 11/10/20 1218       PT SHORT TERM GOAL #1   Title Pt will be ind with initial HEP.    Time 2    Period Weeks    Status Achieved    Target Date 07/24/19  PT Long Term Goals - 12/03/20 1200       PT LONG TERM GOAL #1   Title Pt will be independent for advanced HEP.    Status Partially Met      PT LONG TERM GOAL #2   Title Pt will increase cervical ROM to [facility] to allow pt to look up, perform quick cervical mobility, and look behind him to drive without increased pain.    Status Achieved      PT LONG TERM GOAL #3   Title Increase L shoulder strength to at least 4+/5 to allow him to lift objects into overhead cabinets.    Status Partially Met      PT LONG TERM GOAL #4   Title Pt to report at least a 60% improvement in symptoms to allow him to return to exercise class.    Status Achieved      PT LONG TERM GOAL #5   Title Increase BERG to at least 50/56 to decrease his risk of falling.    Status On-going                   Plan - 12/08/20 1017     Clinical Impression Statement Pt arrives with reports of feeling "79 today." He continues to work with his MD regarding his blood pressure. Treatment today continues to focus on upper quarter strength and mostly endurance today, with balance exercises sprinkled throughout. Pt reports he thinks his balance issues may stem from  his medications.    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, carotid stenosis    Examination-Activity Limitations Sleep;Reach Overhead    Examination-Participation Restrictions Community Activity;Driving    Stability/Clinical Decision Making Evolving/Moderate complexity    Rehab Potential Good    PT Frequency 2x / week    PT Duration 8 weeks    PT Treatment/Interventions ADLs/Self Care Home Management;Aquatic Therapy;Cryotherapy;Electrical Stimulation;Iontophoresis 39m/ml Dexamethasone;Moist Heat;Traction;Ultrasound;Gait training;Stair training;Functional mobility training;Therapeutic exercise;Therapeutic activities;Balance training;Neuromuscular re-education;Patient/family education;Manual techniques    PT Next Visit Plan balance, manual work, postural strength and endurance, follow up with blood preesure issue    PT Home Exercise Plan Access Code: [email or web address]   Consulted and Agree with Plan of Care Patient             Patient will benefit from skilled therapeutic intervention in order to improve the following deficits and impairments:  Decreased range of motion, Decreased endurance, Increased muscle spasms, Impaired UE functional use, Pain, Decreased balance, Impaired flexibility, Postural dysfunction, Decreased strength  Visit Diagnosis: Cervicalgia  Neck muscle spasm  Muscle weakness (generalized)  Balance problem  Other lack of coordination     Problem List Patient Active Problem List   Diagnosis Date Noted   Carotid stenosis, asymptomatic 08/22/2019   Claudication in peripheral vascular disease ([place] 06/08/2019   Essential hypertension 06/05/2019   Hyperlipidemia 06/05/2019   Carotid bruit 06/05/2019   Syncope and collapse 06/05/2019    Gwen Sarvis, PTA 12/08/2020, 10:48 AM  [email or web address] [address]Phone: [phone]  Fax:  [phone] Name: Jonathan SahliMRN: [id number] of Birth: 7April 02, 1942

## 2020-12-10 ENCOUNTER — Encounter: Payer: Self-pay | Admitting: Rehabilitative and Restorative Service Providers"

## 2020-12-10 ENCOUNTER — Other Ambulatory Visit: Payer: Self-pay

## 2020-12-10 ENCOUNTER — Ambulatory Visit: Payer: Medicare Other | Admitting: Rehabilitative and Restorative Service Providers"

## 2020-12-10 DIAGNOSIS — M542 Cervicalgia: Secondary | ICD-10-CM | POA: Diagnosis not present

## 2020-12-10 DIAGNOSIS — M62838 Other muscle spasm: Secondary | ICD-10-CM

## 2020-12-10 DIAGNOSIS — M6281 Muscle weakness (generalized): Secondary | ICD-10-CM

## 2020-12-10 DIAGNOSIS — R2689 Other abnormalities of gait and mobility: Secondary | ICD-10-CM

## 2020-12-10 NOTE — Therapy (Signed)
[facility] @ [address] [place], [place], [address] Phone: [phone]   Fax:  [phone]  Physical Therapy Treatment  Patient Details  Name: Jonathan Savage MRN: [id number] Date of Birth: 1940-05-10 Referring Provider (PT): Dr Narda Amber   Encounter Date: 12/10/2020  Progress Note Reporting Period 11/05/20 to 12/10/2020  See note below for Objective Data and Assessment of Progress/Goals.       PT End of Session - 12/10/20 1022     Visit Number 10    Date for PT Re-Evaluation 12/26/20    Authorization Type Medicare/BCBS    PT Start Time 1013    PT Stop Time 1053    PT Time Calculation (min) 40 min    Activity Tolerance Patient tolerated treatment well    Behavior During Therapy [facility] for tasks assessed/performed             Past Medical History:  Diagnosis Date   Carotid artery occlusion    Complication of anesthesia    pt states following last surgery he was awake for over 24 hours following surgery   Hypertension    Stroke [facility])     Past Surgical History:  Procedure Laterality Date   ENDARTERECTOMY Left 08/22/2019   Procedure: LEFT CAROTID ENDARTERECTOMY WITH BOVIE PATCH ANGIOPLASTY;  Surgeon: Serafina Mitchell, MD;  Location: [facility] OR;  Service: Vascular;  Laterality: Left;   LAPAROSCOPIC NEPHRECTOMY Right    NECK SURGERY     PROSTATE SURGERY      There were no vitals filed for this visit.   Subjective Assessment - 12/10/20 1020     Subjective Pt reports that he is fatigued from his exercise class.  He reports that his BP is "about the same"    Pertinent History Essential HTN, Claudication in peripheral vascular disease, syncope and collapse possibly due to dehydration and hypotension    Patient Stated Goals Pt wants to get his neck working well enough to go back to the gym to work out.    Currently in Pain? Yes    Pain Score 4     Pain Location Neck    Pain Orientation Lower    Pain  Descriptors / Indicators Aching;Constant    Pain Type Chronic pain                [facility] PT Assessment - 12/10/20 0001       Observation/Other Assessments   Focus on Therapeutic Outcomes (FOTO)  70%      Strength   Overall Strength Comments BUE 4+/5 grossly throughout      Standardized Balance Assessment   Standardized Balance Assessment Berg Balance Test      Berg Balance Test   Sit to Stand Able to stand without using hands and stabilize independently    Standing Unsupported Able to stand safely 2 minutes    Sitting with Back Unsupported but Feet Supported on Floor or Stool Able to sit safely and securely 2 minutes    Stand to Sit Sits safely with minimal use of hands    Transfers Able to transfer safely, minor use of hands    Standing Unsupported with Eyes Closed Able to stand 10 seconds safely    Standing Unsupported with Feet Together Able to place feet together independently and stand 1 minute safely    From Standing, Reach Forward with Outstretched Arm Can reach forward >12 cm safely (5")    From Standing Position, Pick up Object from  Floor Able to pick up shoe safely and easily    From Standing Position, Turn to Look Behind Over each Shoulder Looks behind from both sides and weight shifts well    Turn 360 Degrees Able to turn 360 degrees safely in 4 seconds or less    Standing Unsupported, Alternately Place Feet on Step/Stool Able to stand independently and safely and complete 8 steps in 20 seconds    Standing Unsupported, One Foot in Front Able to plae foot ahead of the other independently and hold 30 seconds    Standing on One Leg Able to lift leg independently and hold equal to or more than 3 seconds    Total Score 52                           [facility] Adult PT Treatment/Exercise - 12/10/20 0001       Neuro Re-ed    Neuro Re-ed Details  Tandem walking 15 feet 6x, high kne marchig 15 ft 4x no UE SBA only.  B single leg stance at counter for support,  as needed.      Neck Exercises: Machines for Strengthening   Nustep L5 x6 min with PT present to discuss progress      Neck Exercises: Standing   Other Standing Exercises BOSU step ups front 10x with 30 sec Bil LE balance on last rep: Then side steps 10x Bil with static balance ontop 30 sec                       PT Short Term Goals - 11/10/20 1218       PT SHORT TERM GOAL #1   Title Pt will be ind with initial HEP.    Time 2    Period Weeks    Status Achieved    Target Date 07/24/19               PT Long Term Goals - 12/10/20 1104       PT LONG TERM GOAL #1   Title Pt will be independent for advanced HEP.    Status Partially Met      PT LONG TERM GOAL #2   Title Pt will increase cervical ROM to [facility] to allow pt to look up, perform quick cervical mobility, and look behind him to drive without increased pain.    Status Achieved      PT LONG TERM GOAL #3   Title Increase L shoulder strength to at least 4+/5 to allow him to lift objects into overhead cabinets.    Status Achieved      PT LONG TERM GOAL #4   Title Pt to report at least a 60% improvement in symptoms to allow him to return to exercise class.    Status Achieved      PT LONG TERM GOAL #5   Title Increase BERG to at least 50/56 to decrease his risk of falling.    Status Partially Met   Pt continues to have most difficulty with SLS                  Plan - 12/10/20 1100     Clinical Impression Statement Mr Pauwels is progressing well with goal related activities and improving his balance. Pt continues to have most difficulty with single leg stance and work on the [employer]. He reports that he would primarily like to work on balance during his  remaining time, as he is going to the other exercise class twice weekly. He is progressing towards goal related activities and improving balance.    Personal Factors and Comorbidities Comorbidity 3+    Comorbidities Essential Hypertension,  Claudication in the peripheral vascular disease, carotid stenosis    PT Treatment/Interventions ADLs/Self Care Home Management;Aquatic Therapy;Cryotherapy;Electrical Stimulation;Iontophoresis 81m/ml Dexamethasone;Moist Heat;Traction;Ultrasound;Gait training;Stair training;Functional mobility training;Therapeutic exercise;Therapeutic activities;Balance training;Neuromuscular re-education;Patient/family education;Manual techniques    PT Next Visit Plan balance, manual work, postural strength and endurance, follow up with blood preesure issue    Consulted and Agree with Plan of Care Patient             Patient will benefit from skilled therapeutic intervention in order to improve the following deficits and impairments:  Decreased range of motion, Decreased endurance, Increased muscle spasms, Impaired UE functional use, Pain, Decreased balance, Impaired flexibility, Postural dysfunction, Decreased strength  Visit Diagnosis: Cervicalgia  Neck muscle spasm  Muscle weakness (generalized)  Balance problem     Problem List Patient Active Problem List   Diagnosis Date Noted   Carotid stenosis, asymptomatic 08/22/2019   Claudication in peripheral vascular disease ([facility] 06/08/2019   Essential hypertension 06/05/2019   Hyperlipidemia 06/05/2019   Carotid bruit 06/05/2019   Syncope and collapse 06/05/2019    SJuel Burrow PT, DPT 12/10/2020, 11:07 AM  [email or web address] [address]Phone: [phone]  Fax:  [phone] Name: SMichal CallicottMRN: [id number] of Birth: 701/08/1940

## 2020-12-15 ENCOUNTER — Ambulatory Visit: Payer: Medicare Other | Admitting: Physical Therapy

## 2020-12-15 ENCOUNTER — Encounter: Payer: Self-pay | Admitting: Physical Therapy

## 2020-12-15 ENCOUNTER — Other Ambulatory Visit: Payer: Self-pay

## 2020-12-15 DIAGNOSIS — R278 Other lack of coordination: Secondary | ICD-10-CM

## 2020-12-15 DIAGNOSIS — M542 Cervicalgia: Secondary | ICD-10-CM

## 2020-12-15 DIAGNOSIS — M6281 Muscle weakness (generalized): Secondary | ICD-10-CM

## 2020-12-15 DIAGNOSIS — R2689 Other abnormalities of gait and mobility: Secondary | ICD-10-CM

## 2020-12-15 DIAGNOSIS — M62838 Other muscle spasm: Secondary | ICD-10-CM

## 2020-12-15 NOTE — Therapy (Signed)
[place] @ [place], [place], [address] Phone: [phone]   Fax:  [phone]  Physical Therapy Treatment  Patient Details  Name: Jonathan Savage MRN: [id number] Date of Birth: 23-Aug-1940 Referring Provider (PT): Dr Narda Amber   Encounter Date: 12/15/2020   PT End of Session - 12/15/20 1027     Visit Number 11    Date for PT Re-Evaluation 12/26/20    Authorization Type Medicare/BCBS    PT Start Time 1018    PT Stop Time 1059    PT Time Calculation (min) 41 min    Activity Tolerance Patient tolerated treatment well    Behavior During Therapy [facility] for tasks assessed/performed             Past Medical History:  Diagnosis Date   Carotid artery occlusion    Complication of anesthesia    pt states following last surgery he was awake for over 24 hours following surgery   Hypertension    Stroke [facility])     Past Surgical History:  Procedure Laterality Date   ENDARTERECTOMY Left 08/22/2019   Procedure: LEFT CAROTID ENDARTERECTOMY WITH BOVIE PATCH ANGIOPLASTY;  Surgeon: Serafina Mitchell, MD;  Location: [facility] OR;  Service: Vascular;  Laterality: Left;   LAPAROSCOPIC NEPHRECTOMY Right    NECK SURGERY     PROSTATE SURGERY      There were no vitals filed for this visit.   Subjective Assessment - 12/15/20 1029     Subjective No new complaints thios AM.    Pertinent History Essential HTN, Claudication in peripheral vascular disease, syncope and collapse possibly due to dehydration and hypotension    Currently in Pain? Yes    Pain Score 3     Pain Location Neck    Pain Descriptors / Indicators Sore    Aggravating Factors  Depends on sleeping position    Multiple Pain Sites No                               [facility] Adult PT Treatment/Exercise - 12/15/20 0001       Neuro Re-ed    Neuro Re-ed Details  High knee marching 1# on ankles, lateral steps over hurdles 1# on ankles,   Weight  shifting/dancing in multiplane using colored discs on floor 10x Bil SBA, then single leg stance on blue pad multiple attempts using UE at the counter     Neck Exercises: Machines for Strengthening   Nustep Stationary Bike L1 x 5 min for warm up      Neck Exercises: Standing   Other Standing Exercises Reverse walking plate +10# 91P    Other Standing Exercises BOSU step ups front 10x with 30 sec Bil LE balance on last rep: Then side steps 10x Bil with static balance ontop 30 sec                       PT Short Term Goals - 11/10/20 1218       PT SHORT TERM GOAL #1   Title Pt will be ind with initial HEP.    Time 2    Period Weeks    Status Achieved    Target Date 07/24/19               PT Long Term Goals - 12/10/20 1104       PT LONG TERM GOAL #1  Title Pt will be independent for advanced HEP.    Status Partially Met      PT LONG TERM GOAL #2   Title Pt will increase cervical ROM to [facility] to allow pt to look up, perform quick cervical mobility, and look behind him to drive without increased pain.    Status Achieved      PT LONG TERM GOAL #3   Title Increase L shoulder strength to at least 4+/5 to allow him to lift objects into overhead cabinets.    Status Achieved      PT LONG TERM GOAL #4   Title Pt to report at least a 60% improvement in symptoms to allow him to return to exercise class.    Status Achieved      PT LONG TERM GOAL #5   Title Increase BERG to at least 50/56 to decrease his risk of falling.    Status Partially Met   Pt continues to have most difficulty with SLS                  Plan - 12/15/20 1059     Clinical Impression Statement pt perfromed dynamic balance exercises and activities for a good duration of todays session. Pt used less upper extremity support for balance on the BOSU today but still requires UE support in singel leg stance on balance pad.    Personal Factors and Comorbidities Comorbidity 3+    Comorbidities  Essential Hypertension, Claudication in the peripheral vascular disease, carotid stenosis    Examination-Activity Limitations Sleep;Reach Overhead    Examination-Participation Restrictions Community Activity;Driving    Stability/Clinical Decision Making Evolving/Moderate complexity    Rehab Potential Good    PT Frequency 2x / week    PT Duration 8 weeks    PT Treatment/Interventions ADLs/Self Care Home Management;Aquatic Therapy;Cryotherapy;Electrical Stimulation;Iontophoresis 11m/ml Dexamethasone;Moist Heat;Traction;Ultrasound;Gait training;Stair training;Functional mobility training;Therapeutic exercise;Therapeutic activities;Balance training;Neuromuscular re-education;Patient/family education;Manual techniques    PT Next Visit Plan balance, manual work, postural strength and endurance, follow up with blood preesure issue    PT Home Exercise Plan Access Code: [email or web address]   Consulted and Agree with Plan of Care Patient             Patient will benefit from skilled therapeutic intervention in order to improve the following deficits and impairments:  Decreased range of motion, Decreased endurance, Increased muscle spasms, Impaired UE functional use, Pain, Decreased balance, Impaired flexibility, Postural dysfunction, Decreased strength  Visit Diagnosis: Cervicalgia  Neck muscle spasm  Muscle weakness (generalized)  Balance problem  Other lack of coordination     Problem List Patient Active Problem List   Diagnosis Date Noted   Carotid stenosis, asymptomatic 08/22/2019   Claudication in peripheral vascular disease ([place] 06/08/2019   Essential hypertension 06/05/2019   Hyperlipidemia 06/05/2019   Carotid bruit 06/05/2019   Syncope and collapse 06/05/2019    Janilah Hojnacki, PTA 12/15/2020, 12:21 PM  [email or web address] [email or web address] [address]Phone: [phone]  Fax:  [phone] Name: Jonathan GildnerMRN: [id number] of Birth: 716-Aug-1942

## 2020-12-17 ENCOUNTER — Encounter: Payer: Self-pay | Admitting: Rehabilitative and Restorative Service Providers"

## 2020-12-17 ENCOUNTER — Other Ambulatory Visit: Payer: Self-pay

## 2020-12-17 ENCOUNTER — Ambulatory Visit: Payer: Medicare Other | Admitting: Rehabilitative and Restorative Service Providers"

## 2020-12-17 DIAGNOSIS — M542 Cervicalgia: Secondary | ICD-10-CM

## 2020-12-17 DIAGNOSIS — M6281 Muscle weakness (generalized): Secondary | ICD-10-CM

## 2020-12-17 DIAGNOSIS — M62838 Other muscle spasm: Secondary | ICD-10-CM

## 2020-12-17 DIAGNOSIS — R2689 Other abnormalities of gait and mobility: Secondary | ICD-10-CM

## 2020-12-17 NOTE — Therapy (Signed)
[place] @ [place], [place], [address] Phone: [phone]   Fax:  [phone]  Physical Therapy Treatment  Patient Details  Name: Jonathan Savage MRN: [id number] Date of Birth: 1940/09/19 Referring Provider (PT): Dr Narda Amber   Encounter Date: 12/17/2020   PT End of Session - 12/17/20 1023     Visit Number 12    Date for PT Re-Evaluation 12/26/20    Authorization Type Medicare/BCBS    PT Start Time 1015    PT Stop Time 1055    PT Time Calculation (min) 40 min    Activity Tolerance Patient tolerated treatment well    Behavior During Therapy [facility] for tasks assessed/performed             Past Medical History:  Diagnosis Date   Carotid artery occlusion    Complication of anesthesia    pt states following last surgery he was awake for over 24 hours following surgery   Hypertension    Stroke [facility])     Past Surgical History:  Procedure Laterality Date   ENDARTERECTOMY Left 08/22/2019   Procedure: LEFT CAROTID ENDARTERECTOMY WITH BOVIE PATCH ANGIOPLASTY;  Surgeon: Serafina Mitchell, MD;  [facility] OR;  Service: Vascular;  Laterality: Left;   LAPAROSCOPIC NEPHRECTOMY Right    NECK SURGERY     PROSTATE SURGERY      There were no vitals filed for this visit.   Subjective Assessment - 12/17/20 1022     Subjective Pt reports some soreness following his exercise class yesterday.    Patient Stated Goals Pt wants to get his neck working well enough to go back to the gym to work out.    Currently in Pain? Yes    Pain Score 3     Pain Location Neck    Pain Orientation Lower    Pain Descriptors / Indicators Sore                               [facility] Adult PT Treatment/Exercise - 12/17/20 0001       Neuro Re-ed    Neuro Re-ed Details  High knee marching 1# on ankles, lateral steps over hurdles 1# on ankles,      Neck Exercises: Machines for Strengthening   Nustep L5 x6  min with PT present to discuss progress   420 steps     Neck Exercises: Standing   Other Standing Exercises Reverse walking plate +10# 21J    Other Standing Exercises BOSU step ups front 10x with 30 sec Bil LE balance on last rep: Then side steps 10x Bil with static balance ontop 30 sec.  Toe tap      Neck Exercises: Prone   Other Prone Exercise Quadruped alt UE/LE x10B   1# ankle weights                      PT Short Term Goals - 11/10/20 1218       PT SHORT TERM GOAL #1   Title Pt will be ind with initial HEP.    Time 2    Period Weeks    Status Achieved    Target Date 07/24/19               PT Long Term Goals - 12/17/20 1115       PT LONG TERM GOAL #1   Title Pt will  be independent for advanced HEP.    Status Partially Met      PT LONG TERM GOAL #2   Title Pt will increase cervical ROM to [facility] to allow pt to look up, perform quick cervical mobility, and look behind him to drive without increased pain.    Status Achieved      PT LONG TERM GOAL #3   Title Increase L shoulder strength to at least 4+/5 to allow him to lift objects into overhead cabinets.    Status Achieved      PT LONG TERM GOAL #4   Title Pt to report at least a 60% improvement in symptoms to allow him to return to exercise class.    Status Achieved      PT LONG TERM GOAL #5   Title Increase BERG to at least 50/56 to decrease his risk of falling.    Status Partially Met                   Plan - 12/17/20 1031     Clinical Impression Statement [name] continues to progress well and is improving with his general balance.  Added some new exercises today to progress balance challenges.  Pt requires cuing with quadruped Bird Dog exercise, but able to complete.  He had some difficulty maintaining balance with this exercise.  Updated HEP to include balance exercises, but encouraged pt to continue to perform cervical stretching to maintain gains with his neck. Pt has been able to  continue to participate in exercise classes at [facility] and anticipate that he will be ready to discharge next visit from outpatient PT.    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    PT Home Exercise Plan Access Code: [email or web address]   Consulted and Agree with Plan of Care Patient             Patient will benefit from skilled therapeutic intervention in order to improve the following deficits and impairments:  Decreased range of motion, Decreased endurance, Increased muscle spasms, Impaired UE functional use, Pain, Decreased balance, Impaired flexibility, Postural dysfunction, Decreased strength  Visit Diagnosis: Cervicalgia  Neck muscle spasm  Muscle weakness (generalized)  Balance problem     Problem List Patient Active Problem List   Diagnosis Date Noted   Carotid stenosis, asymptomatic 08/22/2019   Claudication in peripheral vascular disease ([place] 06/08/2019   Essential hypertension 06/05/2019   Hyperlipidemia 06/05/2019   Carotid bruit 06/05/2019   Syncope and collapse 06/05/2019    SJuel Burrow PT, DPT 12/17/2020, 11:16 AM  [email or web address] [address]Phone: [phone]  Fax:  [phone] Name: Jonathan GroveMRN: [id number] of Birth: [birth year]

## 2020-12-22 ENCOUNTER — Ambulatory Visit: Payer: Medicare Other | Admitting: Physical Therapy

## 2020-12-22 ENCOUNTER — Other Ambulatory Visit: Payer: Self-pay

## 2020-12-22 ENCOUNTER — Encounter: Payer: Self-pay | Admitting: Physical Therapy

## 2020-12-22 DIAGNOSIS — M6281 Muscle weakness (generalized): Secondary | ICD-10-CM

## 2020-12-22 DIAGNOSIS — R278 Other lack of coordination: Secondary | ICD-10-CM

## 2020-12-22 DIAGNOSIS — R2689 Other abnormalities of gait and mobility: Secondary | ICD-10-CM

## 2020-12-22 DIAGNOSIS — M542 Cervicalgia: Secondary | ICD-10-CM | POA: Diagnosis not present

## 2020-12-22 DIAGNOSIS — M62838 Other muscle spasm: Secondary | ICD-10-CM

## 2020-12-22 NOTE — Therapy (Addendum)
[place] @ [place], [place], [address] Phone: [phone]   Fax:  [phone]  Physical Therapy Treatment and Discharge Summary  Patient Details  Name: Jonathan Savage MRN: [id number] Date of Birth: [birth year] Referring Provider (PT): Dr Narda Amber   Encounter Date: 12/22/2020   PT End of Session - 12/22/20 1018     Visit Number 13    Date for PT Re-Evaluation 12/26/20    Authorization Type Medicare/BCBS    PT Start Time 1017    PT Stop Time 1050    PT Time Calculation (min) 33 min    Activity Tolerance Patient tolerated treatment well    Behavior During Therapy [facility] for tasks assessed/performed             Past Medical History:  Diagnosis Date   Carotid artery occlusion    Complication of anesthesia    pt states following last surgery he was awake for over 24 hours following surgery   Hypertension    Stroke [facility]     Past Surgical History:  Procedure Laterality Date   ENDARTERECTOMY Left 08/22/2019   Procedure: LEFT CAROTID ENDARTERECTOMY WITH BOVIE PATCH ANGIOPLASTY;  Surgeon: Serafina Mitchell, MD;  [facility] OR;  Service: Vascular;  Laterality: Left;   LAPAROSCOPIC NEPHRECTOMY Right    NECK SURGERY     PROSTATE SURGERY      There were no vitals filed for this visit.   Subjective Assessment - 12/22/20 1020     Subjective I am ready to go on my own.    Currently in Pain? Yes    Pain Score 3     Pain Location Neck    Pain Orientation Right    Pain Descriptors / Indicators Sore    Aggravating Factors  Depends on how I sleep    Pain Relieving Factors Heat. 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    Referring Provider (PT) Dr Narda Amber      Observation/Other Assessments   Focus on Therapeutic Outcomes (FOTO)   70%      Strength   Overall Strength Comments BUE 4+/5 grossly throughout      Standardized Balance Assessment   Standardized Balance Assessment Berg Balance Test      Berg Balance Test   Sit to Stand Able to stand without using hands and stabilize independently    Standing Unsupported Able to stand safely 2 minutes    Sitting with Back Unsupported but Feet Supported on Floor or Stool Able to sit safely and securely 2 minutes    Stand to Sit Sits safely with minimal use of hands    Transfers Able to transfer safely, minor use of hands    Standing Unsupported with Eyes Closed Able to stand 10 seconds safely    Standing Unsupported with Feet Together Able to place feet together independently and stand 1 minute safely    From Standing, Reach Forward with Outstretched Arm Can reach confidently >25 cm (10")    From Standing Position, Pick up Object from Floor Able to pick up shoe safely and easily    From Standing Position, Turn to Look Behind Over each Shoulder Looks behind from both sides and weight shifts well  Turn 360 Degrees Able to turn 360 degrees safely in 4 seconds or less    Standing Unsupported, Alternately Place Feet on Step/Stool Able to stand independently and safely and complete 8 steps in 20 seconds    Standing Unsupported, One Foot in [place] to place foot tandem independently and hold 30 seconds    Standing on One Leg Able to lift leg independently and hold 5-10 seconds    Total Score 55                                    PT Education - 12/22/20 1049     Education Details Review of HEP, gym routine, where to purchase a BOSU, and posture    Person(s) Educated Patient    Methods Explanation;Demonstration    Comprehension Verbalized understanding;Returned demonstration              PT Short Term Goals - 11/10/20 1218       PT SHORT TERM GOAL #1   Title Pt will be ind with initial HEP.    Time 2    Period Weeks    Status Achieved     Target Date 07/24/19               PT Long Term Goals - 12/22/20 1021       PT LONG TERM GOAL #1   Title Pt will be independent for advanced HEP.    Time 8    Period Weeks    Status Achieved    Target Date 08/21/19      PT LONG TERM GOAL #2   Title Pt will increase cervical ROM to [facility] to allow pt to look up, perform quick cervical mobility, and look behind him to drive without increased pain.    Period Weeks    Status Achieved      PT LONG TERM GOAL #3   Title Increase L shoulder strength to at least 4+/5 to allow him to lift objects into overhead cabinets.    Time 8    Period Weeks    Status Achieved    Target Date 08/21/19      PT LONG TERM GOAL #4   Title Pt to report at least a 60% improvement in symptoms to allow him to return to exercise class.    Time 8    Period Weeks    Status Achieved    Target Date 08/21/19      PT LONG TERM GOAL #5   Title Increase BERG to at least 50/56 to decrease his risk of falling.    Baseline 46/56    Time 8    Period Weeks    Status Achieved   55/56   Target Date 08/21/19                   Plan - 12/22/20 1018     Clinical Impression Statement All long term goasl met. Pt arrives verbalizing today is his last PT appointment and will continue with his HEP, neck stretching, balance exercises and going to exercise class 2x week. Pt has short session as he wants to go to his exercise class today. pt increased his BERG score by 3 points.    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, carotid stenosis    Examination-Activity Limitations Sleep;Reach Overhead  Examination-Participation Restrictions Community Activity;Driving    Stability/Clinical Decision Making Evolving/Moderate complexity    Rehab Potential Good    PT Frequency 2x / week    PT Duration 8 weeks    PT Treatment/Interventions ADLs/Self Care Home  Management;Aquatic Therapy;Cryotherapy;Electrical Stimulation;Iontophoresis 79m/ml Dexamethasone;Moist Heat;Traction;Ultrasound;Gait training;Stair training;Functional mobility training;Therapeutic exercise;Therapeutic activities;Balance training;Neuromuscular re-education;Patient/family education;Manual techniques    PT Next Visit Plan Discharge    PT Home Exercise Plan Access Code: [email or web address]   Consulted and Agree with Plan of Care Patient             Patient will benefit from skilled therapeutic intervention in order to improve the following deficits and impairments:  Decreased range of motion, Decreased endurance, Increased muscle spasms, Impaired UE functional use, Pain, Decreased balance, Impaired flexibility, Postural dysfunction, Decreased strength  Visit Diagnosis: Cervicalgia  Neck muscle spasm  Muscle weakness (generalized)  Balance problem  Other lack of coordination     Problem List Patient Active Problem List   Diagnosis Date Noted   Carotid stenosis, asymptomatic 08/22/2019   Claudication in peripheral vascular disease ([place] 06/08/2019   Essential hypertension 06/05/2019   Hyperlipidemia 06/05/2019   Carotid bruit 06/05/2019   Syncope and collapse 06/05/2019   PHYSICAL THERAPY DISCHARGE SUMMARY  Patient agrees to discharge. Patient goals were met. Patient is being discharged due to meeting the stated rehab goals.   Iyari Hagner, PTA 12/22/2020, 10:53 AM  SJuel Burrow PT, DPT 01/13/2021, 07:44 AM  [email or web address] [address]Phone: [phone]  Fax:  [phone] Name: Jonathan ChmielMRN: [id number] of Birth: 7November 10, 1942

## 2020-12-24 ENCOUNTER — Encounter: Payer: Medicare Other | Admitting: Physical Therapy

## 2020-12-25 ENCOUNTER — Other Ambulatory Visit: Payer: Self-pay | Admitting: Cardiovascular Disease

## 2021-01-07 ENCOUNTER — Other Ambulatory Visit: Payer: Self-pay | Admitting: Cardiovascular Disease

## 2021-01-07 DIAGNOSIS — E782 Mixed hyperlipidemia: Secondary | ICD-10-CM

## 2021-01-08 ENCOUNTER — Other Ambulatory Visit: Payer: Self-pay

## 2021-01-08 ENCOUNTER — Encounter: Payer: Self-pay | Admitting: Cardiovascular Disease

## 2021-01-08 ENCOUNTER — Ambulatory Visit (INDEPENDENT_AMBULATORY_CARE_PROVIDER_SITE_OTHER): Payer: Medicare Other | Admitting: Cardiovascular Disease

## 2021-01-08 DIAGNOSIS — I6522 Occlusion and stenosis of left carotid artery: Secondary | ICD-10-CM

## 2021-01-08 DIAGNOSIS — E782 Mixed hyperlipidemia: Secondary | ICD-10-CM | POA: Diagnosis not present

## 2021-01-08 DIAGNOSIS — I10 Essential (primary) hypertension: Secondary | ICD-10-CM

## 2021-01-08 NOTE — Progress Notes (Signed)
01/08/2021 Deberah Castle [facility]   1941-01-05  [id number]  Primary Physician Heywood Bene, PA-C Primary Cardiologist: Lorretta Harp MD Garret Reddish, [place], [place]  HPI:  Jonathan Savage is a 81 y.o.  thin appearing widowed Caucasian male father of 3 daughters, grandfather of 5 grandchildren referred by Dr. Stephanie Acre, his PCP, for a brief episode of syncope.  He is retired Games developer.  I last saw him in the office 10/24/2019.  He was recently relocated from [place] to [place] to be closer to his daughter Karna Christmas.  I just saw him in the office on 06/05/2019.  His cardiac risk factor profile is notable for treated hypertension and untreated hyperlipidemia.  Father did have bypass surgery.  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  He is never had a heart attack.  Denies chest pain or shortness of breath.  He had a recent episode of brief loss of consciousness while trimming bushes in the yard during the hot summer day.  He attributes this to overheating and dehydration.   I obtained carotid Dopplers on him on 06/07/2019 that showed high-grade left ICA stenosis.  He is also has been complaining claudication.  He denies chest pain or shortness of breath.   I referred him to Dr. Trula Slade who performed uneventful left carotid endarterectomy on 08/22/2019.  He had a nonischemic Myoview performed for preoperative clearance 06/21/2019.  He did have a vagal episode postoperatively and was seen by Dr. Audie Box.  Since I saw him a year ago he continues to do well.  It sounds like he may have had a syncopal episode recently while sitting in a chair at a [employer].  He does get some pain in his legs which he attributes to "bad knees".  He denies chest pain or shortness of breath.  He works out at [employer]) twice a week.   Current Meds  Medication Sig   AMBULATORY NON FORMULARY MEDICATION Medication Name:  super beets   amLODipine (NORVASC) 2.5 MG tablet TAKE 3 TABLETS BY MOUTH DAILY.   aspirin EC 81 MG tablet Take 81 mg by mouth daily.   hydrochlorothiazide (HYDRODIURIL) 25 MG tablet Take 25 mg by mouth daily.   irbesartan (AVAPRO) 150 MG tablet Take 0.5 tablets (75 mg total) by mouth daily. (Patient taking differently: Take 225 mg by mouth daily.)   Omega 3-6-9 Fatty Acids (OMEGA 3-6-9 COMPLEX PO) Take by mouth.   rosuvastatin (CRESTOR) 10 MG tablet Take 1 tablet (10 mg total) by mouth daily. Patient must schedule annual appointment for future refills. First attempt   Valerian 450 MG CAPS Take by mouth.   vitamin C (ASCORBIC ACID) 500 MG tablet Take 500 mg by mouth daily.     No Known Allergies  Social History   Socioeconomic History   Marital status: Widowed    Spouse name: Not on file   Number of children: 3   Years of education: Not on file   Highest education level: Not on file  Occupational History   Not on file  Tobacco Use   Smoking status: Former   Smokeless tobacco: Never  Vaping Use   Vaping Use: Never used  Substance and Sexual Activity   Alcohol use: Never   Drug use: Never   Sexual activity: Not on file  Other Topics Concern   Not on file  Social History Narrative   Right Handed  Lives in a two story home    Father to 3 girls    Social Determinants of Health   Financial Resource Strain: Not on file  Food Insecurity: Not on file  Transportation Needs: Not on file  Physical Activity: Not on file  Stress: Not on file  Social Connections: Not on file  Intimate Partner Violence: Not on file     Review of Systems: General: negative for chills, fever, night sweats or weight changes.  Cardiovascular: negative for chest pain, dyspnea on exertion, edema, orthopnea, palpitations, paroxysmal nocturnal dyspnea or shortness of breath Dermatological: negative for rash Respiratory: negative for cough or wheezing Urologic: negative for hematuria Abdominal:  negative for nausea, vomiting, diarrhea, bright red blood per rectum, melena, or hematemesis Neurologic: negative for visual changes, syncope, or dizziness All other systems reviewed and are otherwise negative except as noted above.    Blood pressure (!) 150/72, pulse 60, height 5\' 7"  (1.702 m), weight 151 lb 12.8 oz (68.9 kg), SpO2 97 %.  General appearance: alert and no distress Neck: no adenopathy, no carotid bruit, no JVD, supple, symmetrical, trachea midline, and thyroid not enlarged, symmetric, no tenderness/mass/nodules Lungs: clear to auscultation bilaterally Heart: regular rate and rhythm, S1, S2 normal, no murmur, click, rub or gallop Extremities: extremities normal, atraumatic, no cyanosis or edema Pulses: 2+ and symmetric Skin: Skin color, texture, turgor normal. No rashes or lesions Neurologic: Grossly normal  EKG sinus rhythm at 60 with nonspecific ST and T wave changes.  I personally reviewed this EKG.  ASSESSMENT AND PLAN:   Essential hypertension History of essential hypertension blood pressure measured today at 150/72.  He is on Avapro, amlodipine and hydrochlorothiazide.  His blood pressures have been running on the "high range".  I have asked him to keep a blood pressure log for 30 days and he will see a Pharm.D. back to review  Hyperlipidemia History of hyperlipidemia on statin therapy with lipid profile performed 12/19/2019 revealing a total cholesterol of 163, LDL of 92 and HDL of 56.  Carotid stenosis, asymptomatic History of carotid artery disease status post elective left carotid endarterectomy performed by Dr. Trula Slade at my request 08/22/2019 which she has been following by duplex ultrasound since then.     Lorretta Harp MD FACP,FACC,FAHA, [facility] 01/08/2021 2:26 PM

## 2021-01-08 NOTE — Assessment & Plan Note (Signed)
History of carotid artery disease status post elective left carotid endarterectomy performed by Dr. Myra Gianotti at my request 08/22/2019 which she has been following by duplex ultrasound since then.

## 2021-01-08 NOTE — Patient Instructions (Signed)
Medication Instructions:  The current medical regimen is effective;  continue present plan and medications.  *If you need a refill on your cardiac medications before your next appointment, please call your pharmacy*  Follow-Up: At [facility], you and your health needs are our priority.  As part of our continuing mission to provide you with exceptional heart care, we have created designated Provider Care Teams.  These Care Teams include your primary Cardiologist (physician) and Advanced Practice Providers (APPs -  Physician Assistants and Nurse Practitioners) who all work together to provide you with the care you need, when you need it.  We recommend signing up for the patient portal called "MyChart".  Sign up information is provided on this After Visit Summary.  MyChart is used to connect with patients for Virtual Visits (Telemedicine).  Patients are able to view lab/test results, encounter notes, upcoming appointments, etc.  Non-urgent messages can be sent to your provider as well.   To learn more about what you can do with MyChart, go to [email or web address].    Your next appointment:   12 month(s)  The format for your next appointment:   In Person  Provider:   Nanetta Batty, MD     Other Instructions Keep BP log for 30 days, come back to see PHARMD in 4 weeks (they will reach out for an appointment)

## 2021-01-08 NOTE — Assessment & Plan Note (Signed)
History of essential hypertension blood pressure measured today at 150/72.  He is on Avapro, amlodipine and hydrochlorothiazide.  His blood pressures have been running on the "high range".  I have asked him to keep a blood pressure log for 30 days and he will see a Pharm.D. back to review

## 2021-01-08 NOTE — Assessment & Plan Note (Signed)
History of hyperlipidemia on statin therapy with lipid profile performed 12/19/2019 revealing a total cholesterol of 163, LDL of 92 and HDL of 56.

## 2021-02-05 ENCOUNTER — Ambulatory Visit: Payer: Medicare Other

## 2021-02-20 ENCOUNTER — Other Ambulatory Visit: Payer: Self-pay | Admitting: Cardiovascular Disease

## 2021-02-20 DIAGNOSIS — E782 Mixed hyperlipidemia: Secondary | ICD-10-CM

## 2021-03-03 ENCOUNTER — Other Ambulatory Visit: Payer: Self-pay

## 2021-03-03 ENCOUNTER — Ambulatory Visit (INDEPENDENT_AMBULATORY_CARE_PROVIDER_SITE_OTHER): Payer: Medicare Other | Admitting: Pharmacist Clinician (PhC)/ Clinical Pharmacy Specialist

## 2021-03-03 DIAGNOSIS — I6522 Occlusion and stenosis of left carotid artery: Secondary | ICD-10-CM | POA: Diagnosis not present

## 2021-03-03 DIAGNOSIS — I10 Essential (primary) hypertension: Secondary | ICD-10-CM

## 2021-03-03 NOTE — Progress Notes (Signed)
03/03/2021 Ryoma Nofziger [facility] Sep 23, 1940 [id number]   HPI:  Deni Lefever is a 81 y.o. male patient of Dr Allyson Sabal, with a PMH below who presents today for hypertension clinic evaluation.  He was last seen by Dr. Allyson Sabal in early January, at which time his blood pressure was noted to be 150/72.  No changes were made at that time and he was asked to monitor and log home readings.   He has been seen by both Laural Golden and Malena Peer in the past as well.    Today he returns for follow up.  States he has ongoing problems with neck and knee pain, and this causes him to loose sleep.  This will impact his blood pressure.  Since he saw Dr. Allyson Sabal last, he has discontinued hydrochlorothiazide as well as amlodipine.  He seems frustrated with continued changes to his medications as well as lack of BP control.   He also states that he was taking a "brand name" medication, 20 mg, that worked well for him in the past when he lived in [place].  I [facility] and was only able to find losartan 100 mg and amlodipine 2.5 mg.    06/05/19 - JB 160/86 - losartan 100  // keep 30 d log and see PharmD 06/22/19 - call - 174/101 - added amlodipine 5 07/03/19 - CP 156/76 -  took amlodipine 5 x 1 then cut to 2.5 daily (5 caused insomnia); states unsteady on medications) - increased to amlodipine 5 07/19/19 - CP 144/80 - increased amlodipine to 7.5  10/24/19 - JB 158/78 - no change - amlodipine 7.5, losartan 100 12/07/19 - JB 142/69 - no change 09/11/20 - MM - 146/70 - switch losartan to irbesartan 150  09/23/20 - MM 120/60 - no change 09/25/20 - call 70/40 - syncopal episode, hold meds x 2 days, then decrease irbesartan to 75 mg, continue amlodipine 10/03/20 - MM 140/70 - increase amlodipine to 10 mg, cont irbesartan 75 11/03/20 - [name] - increase irbesartan to 150, decrease amlodipine to 2.5 11/12/20 - [name] - increase amlodipine to 5 mg, continue irbesartan 150 12/01/20 - [name] 150/70 - increase  irbesartan to 225, "decrease amlodipine to 5 mg" 12/04/20 - [name] 161/94 - no change, continue to monitor 12/17/20 [name] - increase amlodipine to 10 mg 01/07/21 - [name] - add hctz 12.5 mg x 1 week then increase to 25 mg qd, stop amlodipine, continue irbesartan 225  01/08/21 - JB 150/72 - no change, f/u with PharmD 01/21/21 - [name] - stopped hctz, didn't tolerate, continue irbesartan 225, amlodipine 7.5 01/30/21 - [name] walk in - 158/92 - did not continue amlodipine, so instead start metoprolol succ 25 02/24/21- [name] - advised to f/u with Cardiology HTN clinic as pt not tolerating what she has prescribed   Amlodipine - stopped Dr. Mayford Knife at [facility]  Hydrochlorothiazie - "couldn't function", felt like head and feet were 2 different people.    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  PVD claudication  stroke ~10 years ago, some left side extremity weakness     Blood Pressure Goal:  130/80  Current Medications: irbesartan 225 mg  qd  Social Hx: no alcohol, no tobacco  Diet: cereal with fruit for breakfast most days (cheerios, almond milk); lunch - doesn't eat much, breakfast is not until 10-10:30 am; not a lot of meats, some protein from nuts; tyring to cut back on sugar; sometimes veggies with dinner; tries to avoid pastas and breads  Exercise: some PT after endart; does silver Sneakers twice weekly  Home BP readings: home - arm - cuff up to 82 years old; validated 6/21 PharmD appt  Intolerances: hydrochlorothiazide - leg pain  Labs: 10/22:  Na 137, K 4.3, Glu 161, BUN 22, SCr 1.30 GFR 55   Wt Readings from Last 3 Encounters:  03/03/21 153 lb 6.4 oz (69.6 kg)  01/08/21 151 lb 12.8 oz (68.9 kg)  09/25/20 145 lb  (65.8 kg)   BP Readings from Last 3 Encounters:  03/03/21 (!) 190/102  01/08/21 (!) 150/72  12/01/20 (!) 150/75   Pulse Readings from Last 3 Encounters:  03/03/21 75  01/08/21 60  10/03/20 72    Current Outpatient Medications  Medication Sig Dispense Refill   AMBULATORY NON FORMULARY MEDICATION Medication Name: super beets     aspirin EC 81 MG tablet Take 81 mg by mouth daily.     irbesartan (AVAPRO) 150 MG tablet Take 0.5 tablets (75 mg total) by mouth daily. (Patient taking differently: Take 225 mg by mouth daily.) 90 tablet 3   metoprolol succinate (TOPROL-XL) 25 MG 24 hr tablet Take 25 mg by mouth daily.     Omega 3-6-9 Fatty Acids (OMEGA 3-6-9 COMPLEX PO) Take by mouth.     rosuvastatin (CRESTOR) 10 MG tablet Take 1 tablet (10 mg total) by mouth daily. 90 tablet 3   vitamin B-12 (CYANOCOBALAMIN) 1000 MCG tablet Take 1,000 mcg by mouth daily.     vitamin C (ASCORBIC ACID) 500 MG tablet Take 500 mg by mouth daily.     No current facility-administered medications for this visit.    Allergies  Allergen Reactions   Hydrochlorothiazide     Other reaction(s): Other (See Comments) Leg pain     Past Medical History:  Diagnosis Date   Carotid artery occlusion    Complication of anesthesia    pt states following last surgery he was awake for over 24 hours following surgery   Hypertension    Stroke (HCC)     Blood pressure (!) 190/102, pulse 75, resp. rate 15, height 5\' 7"  (1.702 m), weight 153 lb 6.4 oz (69.6 kg), SpO2 99 %.  Essential hypertension Patient with essential hypertension compounded with his hesitancy to take mediations.  We had a long discussion about medication importance, and the fact that the # of milligrams of one medicine does not make it more or less potent when compared to the # of milligrams of another.  Will have him continue with the irbesartan 225 mg daily and re-start amlodipine.  I asked him to take 2.5 mg nightly for 1 week, then increase to 5 mg  nightly thereafter.  He should continue with metoprolol for now, although I would prefer to switch that to carvedilol for better effect.  He is hesitant to start new meds, so will leave this switch for a possible future appointment.  He was asked to keep a BP log for the next month and bring that back when he returns in a month.    PharmD CPP [facility] [address] [place], [place] [phone]

## 2021-03-03 NOTE — Assessment & Plan Note (Signed)
Patient with essential hypertension compounded with his hesitancy to take mediations.  We had a long discussion about medication importance, and the fact that the # of milligrams of one medicine does not make it more or less potent when compared to the # of milligrams of another.  Will have him continue with the irbesartan 225 mg daily and re-start amlodipine.  I asked him to take 2.5 mg nightly for 1 week, then increase to 5 mg nightly thereafter.  He should continue with metoprolol for now, although I would prefer to switch that to carvedilol for better effect.  He is hesitant to start new meds, so will leave this switch for a possible future appointment.  He was asked to keep a BP log for the next month and bring that back when he returns in a month.

## 2021-03-03 NOTE — Patient Instructions (Addendum)
Return for a a follow up appointment March 28 at 9:30 am  Check your blood pressure at home twice daily and keep record of the readings.  Take your BP meds as follows:  Continue with the irbesartan 225 mg (1.5 tablets) each morning  Start the amlodipine 2.5 mg each night for a week, then increase to 5 mg (2 tablets) each night.  Bring all of your meds, your BP cuff and your record of home blood pressures to your next appointment.  Exercise as youre able, try to walk approximately 30 minutes per day.  Keep salt intake to a minimum, especially watch canned and prepared boxed foods.  Eat more fresh fruits and vegetables and fewer canned items.  Avoid eating in fast food restaurants.    HOW TO TAKE YOUR BLOOD PRESSURE: Rest 5 minutes before taking your blood pressure.  Dont smoke or drink caffeinated beverages for at least 30 minutes before. Take your blood pressure before (not after) you eat. Sit comfortably with your back supported and both feet on the floor (dont cross your legs). Elevate your arm to heart level on a table or a desk. Use the proper sized cuff. It should fit smoothly and snugly around your bare upper arm. There should be enough room to slip a fingertip under the cuff. The bottom edge of the cuff should be 1 inch above the crease of the elbow. Ideally, take 3 measurements at one sitting and record the average

## 2021-03-13 ENCOUNTER — Telehealth: Payer: Self-pay | Admitting: Cardiovascular Disease

## 2021-03-13 NOTE — Telephone Encounter (Signed)
Spoke to patient he stated he is concerned his pulse has been low ranging in 50's.Stated he is not sleeping well at night he thought low pulse is causing him not to sleep.Stated he has been taking Amlodipine 2.5 mg ( 2 tablets ) every night for the past 3 nights.B/P continues to be elevated today 178/90.Patient reassured pulse in 50's is ok.I will send message to our pharmacist for advice. ?

## 2021-03-13 NOTE — Telephone Encounter (Signed)
STAT if HR is under 50 or over 120 ?(normal HR is 60-100 beats per minute) ? ?What is your heart rate? Not sire. Patient does not have way to check HR when he is not actively taking his BP ? ?Do you have a log of your heart rate readings (document readings)?  ?03/13/21 8:35 am :156/86 HR 50 ?  ?Do you have any other symptoms? Patient not sleeping well ? ? ?Patient wanted to talk to Dr. Allyson Sabal or his RN to see if his HR is normal. He is wondering if his HR goes too low at night which may be causing him to not sleep well  ?

## 2021-03-16 NOTE — Telephone Encounter (Signed)
LMOM

## 2021-03-18 NOTE — Telephone Encounter (Signed)
Spoke with patient.  States HR still mostly in the 50's, and he feels tired/lethargic.  Advised that he cut metoprolol succ from 25 to 12.5 mg daily and continue with other medications.   Patient voiced understanding.  ?

## 2021-03-31 ENCOUNTER — Other Ambulatory Visit: Payer: Self-pay

## 2021-03-31 ENCOUNTER — Ambulatory Visit (INDEPENDENT_AMBULATORY_CARE_PROVIDER_SITE_OTHER): Payer: Medicare Other | Admitting: Pharmacist Clinician (PhC)/ Clinical Pharmacy Specialist

## 2021-03-31 ENCOUNTER — Encounter: Payer: Self-pay | Admitting: Pharmacist Clinician (PhC)/ Clinical Pharmacy Specialist

## 2021-03-31 DIAGNOSIS — I10 Essential (primary) hypertension: Secondary | ICD-10-CM | POA: Diagnosis not present

## 2021-03-31 DIAGNOSIS — I6522 Occlusion and stenosis of left carotid artery: Secondary | ICD-10-CM

## 2021-03-31 MED ORDER — HYDRALAZINE HCL 25 MG PO TABS: 25.0000 mg | ORAL_TABLET | Freq: Two times a day (BID) | ORAL | 3 refills | Status: DC

## 2021-03-31 NOTE — Assessment & Plan Note (Signed)
Patient with essential hypertension, currently on irbesartan, amlodipine and metoprolol, with BP not to goal.  Patient very hesitant to take more medication to get to BP goal.  Offered choices of increasing irbesartan, or adding either spironolactone or hydralazine.  He was willing to try hydralazine, but feels that medication can't be good for the body and he would be better off without all these chemicals in his system.  Advised him that we are trying to prevent future heart disease as well as complications of chronic hypertension, but ultimately the choice is his as to whether he takes any medication or not.  Asked that he call if any concerns with hydralazine.  We will plan to see him back in 2 months, but if he chooses to discontinue medications, he can cancel appointment.  ?

## 2021-03-31 NOTE — Patient Instructions (Signed)
Return for a a follow up appointment Tuesday May 23 at 9:30 am ? ?Check your blood pressure at home 3-4 times each week and keep record of the readings. ? ?Take your BP meds as follows: ? Start hydralazine 25 mg twice daily ? Continue with all other medication ? ?If you have any problems or concerns, please call us (Kemya Shed or Gerald Stabs) at [phone] ? ?Bring all of your meds, your BP cuff and your record of home blood pressures to your next appointment.  Exercise as you?re able, try to walk approximately 30 minutes per day.  Keep salt intake to a minimum, especially watch canned and prepared boxed foods.  Eat more fresh fruits and vegetables and fewer canned items.  Avoid eating in fast food restaurants.  ? ? HOW TO TAKE YOUR BLOOD PRESSURE: ?Rest 5 minutes before taking your blood pressure. ? Don?t smoke or drink caffeinated beverages for at least 30 minutes before. ?Take your blood pressure before (not after) you eat. ?Sit comfortably with your back supported and both feet on the floor (don?t cross your legs). ?Elevate your arm to heart level on a table or a desk. ?Use the proper sized cuff. It should fit smoothly and snugly around your bare upper arm. There should be enough room to slip a fingertip under the cuff. The bottom edge of the cuff should be 1 inch above the crease of the elbow. ?Ideally, take 3 measurements at one sitting and record the average. ? ? ?

## 2021-03-31 NOTE — Progress Notes (Signed)
? ? ? ?03/31/2021 ?Jonathan Savage ?08/10/1940 ?[id number] ? ? ?HPI:  Jonathan Savage is a 81 y.o. male patient of Dr Allyson Sabal, with a PMH below who presents today for hypertension clinic evaluation.  He was last seen by Dr. Allyson Sabal in early January, at which time his blood pressure was noted to be 150/72.  No changes were made at that time and he was asked to monitor and log home readings.   He has been seen by both Jonathan Savage and Jonathan Savage in the past as well.  At his last visit with me we had discussion about needing to keep medications unchanged and that the mg doses were not equivalent from one medication to the next.  He recalled being on a "brand name" medication that worked when he lived in [place], so I reached out to the [facility] there, but was only able to find losartan 100 mg and amlodipine 5 mg.  I asked that he continue with irbesartan 225 mg and restart the amlodipine.  Suggested he start with 2.5 mg and take daily for 1 week, then increase to 5 mg daily.  He was asked to keep home BP log and bring with him. ? ?Today he returns follow up.  He is still taking irbesartan and amlodipine.  Complains of ongoing dizziness and believes it to be caused by medications.  Also having problems with constipation, but this has been ongoing for some time.   ? ? ?06/05/19 - JB 160/86 - losartan 100  // keep 30 d log and see PharmD ?06/22/19 - call - 174/101 - added amlodipine 5 ?07/03/19 - CP 156/76 -  took amlodipine 5 x 1 then cut to 2.5 daily (5 caused insomnia); states unsteady on medications) - increased to amlodipine 5 ?07/19/19 - CP 144/80 - increased amlodipine to 7.5  ?10/24/19 - JB 158/78 - no change - amlodipine 7.5, losartan 100 ?12/07/19 - JB 142/69 - no change ?09/11/20 - MM - 146/70 - switch losartan to irbesartan 150  ?09/23/20 - MM 120/60 - no change ?09/25/20 - call 70/40 - syncopal episode, hold meds x 2 days, then decrease irbesartan to 75 mg, continue amlodipine ?10/03/20 - MM 140/70 - increase  amlodipine to 10 mg, cont irbesartan 75 ?11/03/20 - [name] - increase irbesartan to 150, decrease amlodipine to 2.5 ?11/12/20 - [name] - increase amlodipine to 5 mg, continue irbesartan 150 ?12/01/20 - [name] 150/70 - increase irbesartan to 225, "decrease amlodipine to 5 mg" ?12/04/20 - [name] 161/94 - no change, continue to monitor ?12/17/20 [name] - increase amlodipine to 10 mg ?01/07/21 - [name] - add hctz 12.5 mg x 1 week then increase to 25 mg qd, stop amlodipine, continue irbesartan 225  ?01/08/21 - JB 150/72 - no change, f/u with PharmD ?01/21/21 - [name] - stopped hctz, didn't tolerate, continue irbesartan 225, amlodipine 7.5 ?01/30/21 - [name] walk in - 158/92 - did not continue amlodipine, so instead start metoprolol succ 25 ?02/24/21- [name] - advised to f/u with Cardiology HTN clinic as pt not tolerating what she has prescribed. ? ? 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  ? ?Blood Pressure Goal:  130/80 ? ?Current Medications: irbesartan 225 mg qd, amlodipine 5 mg qd ? ?Social Hx: no alcohol, no tobacco ? ?Diet: cereal with fruit for breakfast  most days (cheerios, almond milk); lunch - doesn't eat much, breakfast is not until 10-10:30 am; not a lot of meats, some protein from nuts; tyring to cut back on sugar; sometimes veggies with dinner; tries to avoid pastas and breads ? ?Exercise: some PT after endart; does silver Sneakers twice weekly ? ?Home BP readings: home - arm - cuff up to 81 years old; validated 6/21 PharmD appt ? ?Intolerances: hydrochlorothiazide - leg pain, "couldn't function"  ? ?Labs: 10/22:  Na 137, K 4.3, Glu 161, BUN 22, SCr 1.30 GFR 55 ? ? ?Wt Readings from Last 3 Encounters:  ?03/31/21 153 lb (69.4 kg)  ?03/03/21 153 lb 6.4 oz (69.6 kg)  ?01/08/21 151 lb 12.8 oz (68.9 kg)  ? ?BP Readings from Last 3 Encounters:   ?03/31/21 (!) 160/72  ?03/03/21 (!) 190/102  ?01/08/21 (!) 150/72  ? ?Pulse Readings from Last 3 Encounters:  ?03/31/21 72  ?03/03/21 75  ?01/08/21 60  ? ? ?Current Outpatient Medications  ?Medication Sig Dispense Refill  ? AMBULATORY NON FORMULARY MEDICATION Medication Name: super beets    ? amLODipine (NORVASC) 2.5 MG tablet Take 2 tablets ( 5 mg ) every night 180 tablet 3  ? aspirin EC 81 MG tablet Take 81 mg by mouth daily.    ? hydrALAZINE (APRESOLINE) 25 MG tablet Take 1 tablet (25 mg total) by mouth in the morning and at bedtime. 60 tablet 3  ? irbesartan (AVAPRO) 150 MG tablet Take 1&1/2 tablets daily    ? metoprolol succinate (TOPROL-XL) 25 MG 24 hr tablet Take 12.5 mg by mouth daily.    ? Omega 3-6-9 Fatty Acids (OMEGA 3-6-9 COMPLEX PO) Take by mouth.    ? rosuvastatin (CRESTOR) 10 MG tablet Take 1 tablet (10 mg total) by mouth daily. 90 tablet 3  ? vitamin B-12 (CYANOCOBALAMIN) 1000 MCG tablet Take 1,000 mcg by mouth daily.    ? vitamin C (ASCORBIC ACID) 500 MG tablet Take 500 mg by mouth daily.    ? ?No current facility-administered medications for this visit.  ? ? ?Allergies  ?Allergen Reactions  ? Hydrochlorothiazide   ?  Other reaction(s): Other (See Comments) ?Leg pain   ? ? ?Past Medical History:  ?Diagnosis Date  ? Carotid artery occlusion   ? Complication of anesthesia   ? pt states following last surgery he was awake for over 24 hours following surgery  ? Hypertension   ? Stroke [facility])   ? ? ?Blood pressure (!) 160/72, pulse 72, resp. rate 14, height 5\' 7"  (1.702 m), weight 153 lb (69.4 kg), SpO2 98 %. ? ?Essential hypertension ?Patient with essential hypertension, currently on irbesartan, amlodipine and metoprolol, with BP not to goal.  Patient very hesitant to take more medication to get to BP goal.  Offered choices of increasing irbesartan, or adding either spironolactone or hydralazine.  He was willing to try hydralazine, but feels that medication can't be good for the body and he would be  better off without all these chemicals in his system.  Advised him that we are trying to prevent future heart disease as well as complications of chronic hypertension, but ultimately the choice is his as to whether he takes any medication or not.  Asked that he call if any concerns with hydralazine.  We will plan to see him back in 2 months, but if he chooses to discontinue medications, he can cancel appointment.  ? ? ? PharmD CPP [facility] ?[facility] ?[address]  [address] ?[place], [place] [address] ?[phone] ? ?  ?

## 2021-05-26 ENCOUNTER — Ambulatory Visit (INDEPENDENT_AMBULATORY_CARE_PROVIDER_SITE_OTHER): Payer: Medicare Other | Admitting: Pharmacist Clinician (PhC)/ Clinical Pharmacy Specialist

## 2021-05-26 DIAGNOSIS — I10 Essential (primary) hypertension: Secondary | ICD-10-CM | POA: Diagnosis not present

## 2021-05-26 MED ORDER — CARVEDILOL 6.25 MG PO TABS: 6.2500 mg | ORAL_TABLET | Freq: Two times a day (BID) | ORAL | 3 refills | Status: DC

## 2021-05-26 NOTE — Patient Instructions (Signed)
Return for a a follow up appointment July 26 at 10 am  Check your blood pressure at home 3-4 days each week and keep record of the readings.  Take your BP meds as follows:  Stop metoprolol.  Start carvedilol 6.25 mg twice daily  Bring all of your meds, your BP cuff and your record of home blood pressures to your next appointment.  Exercise as you're able, try to walk approximately 30 minutes per day.  Keep salt intake to a minimum, especially watch canned and prepared boxed foods.  Eat more fresh fruits and vegetables and fewer canned items.  Avoid eating in fast food restaurants.    HOW TO TAKE YOUR BLOOD PRESSURE: Rest 5 minutes before taking your blood pressure.  Don't smoke or drink caffeinated beverages for at least 30 minutes before. Take your blood pressure before (not after) you eat. Sit comfortably with your back supported and both feet on the floor (don't cross your legs). Elevate your arm to heart level on a table or a desk. Use the proper sized cuff. It should fit smoothly and snugly around your bare upper arm. There should be enough room to slip a fingertip under the cuff. The bottom edge of the cuff should be 1 inch above the crease of the elbow. Ideally, take 3 measurements at one sitting and record the average.

## 2021-05-26 NOTE — Assessment & Plan Note (Signed)
Patient with essential hypertension, still not at goal.  Had long discussion about the need for 3-4 medications to get most patients to goal, and he is not at the higher doses of any of his.  He is always hesitant to increase medications or add new, so for now will have him switch metoprolol succ 12.5 mg to carvedilol 6.25 mg bid.  HR stable in the 70's in office today.  Hopefully we can get some better BP effect from this.  Will see him back in 2 months for follow up.

## 2021-05-26 NOTE — Progress Notes (Signed)
05/26/2021 [facility] 04-Oct-1940 [id number]   HPI:  Jonathan RodesStanley Warren Savage is a 81 y.o. male patient of Dr Allyson SabalBerry, with a PMH below who presents today for hypertension clinic evaluation.  He was last seen by Dr. Allyson SabalBerry in early January, at which time his blood pressure was noted to be 150/72.  No changes were made at that time and he was asked to monitor and log home readings.   He has been seen by both Laural Goldenhris Pavero and Malena PeerMelissa Maccia in the past as well.  At his last visit with me we had discussion about needing to keep medications unchanged and that the mg doses were not equivalent from one medication to the next.  He recalled being on a "brand name" medication that worked when he lived in [place], so [facility], but was only able to find losartan 100 mg and amlodipine 5 mg.  I asked that he continue with irbesartan 225 mg and restart the amlodipine.  Suggested he start with 2.5 mg and take daily for 1 week, then increase to 5 mg daily.  He was asked to keep home BP log and bring with him.  At his last visit we did convince him to start with hydralazine 25 mg bid.    Today he returns follow up.  He is still taking his medications and had no noticeable changes since starting hydralazine.  BP readings at home are 130's in the evenings, but run as high as 160 in the mornings.  He forgot the list this morning.  He wants to know if we can try switching some of his medications on ones that will work better.  Explained that he has tried many medicines, and we are not at the high doses of what he is taking, so we can't say the medications are inefficient at this time.  Also explained that if often takes 3-4 medications to reach goal.     Past history of medication changes:  06/05/19 - JB 160/86 - losartan 100  // keep 30 d log and see PharmD 06/22/19 - call - 174/101 - added amlodipine 5 07/03/19 - CP 156/76 -  took amlodipine 5 x 1 then cut to 2.5 daily (5 caused insomnia);  states unsteady on medications) - increased to amlodipine 5 07/19/19 - CP 144/80 - increased amlodipine to 7.5  10/24/19 - JB 158/78 - no change - amlodipine 7.5, losartan 100 12/07/19 - JB 142/69 - no change 09/11/20 - MM - 146/70 - switch losartan to irbesartan 150  09/23/20 - MM 120/60 - no change 09/25/20 - call 70/40 - syncopal episode, hold meds x 2 days, then decrease irbesartan to 75 mg, continue amlodipine 10/03/20 - MM 140/70 - increase amlodipine to 10 mg, cont irbesartan 75 11/03/20 - [name] - increase irbesartan to 150, decrease amlodipine to 2.5 11/12/20 - [name] - increase amlodipine to 5 mg, continue irbesartan 150 12/01/20 - [name] 150/70 - increase irbesartan to 225, "decrease amlodipine to 5 mg" 12/04/20 - [name] 161/94 - no change, continue to monitor 12/17/20 [name] - increase amlodipine to 10 mg 01/07/21 - [name] - add hctz 12.5 mg x 1 week then increase to 25 mg qd, stop amlodipine, continue irbesartan 225  01/08/21 - JB 150/72 - no change, f/u with PharmD 01/21/21 - [name] - stopped hctz, didn't tolerate, continue irbesartan 225, amlodipine 7.5 01/30/21 - [name] walk in - 158/92 - did not continue amlodipine,  so instead start metoprolol succ 25 02/24/21- [name] - advised to f/u with Cardiology HTN clinic as pt not tolerating what she has prescribed.   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  PVD claudication  stroke ~10 years ago, some left side extremity weakness     Blood Pressure Goal:  130/80  Current Medications: irbesartan 225 mg qd, amlodipine 5 mg qd (pm) hydralazine 25 mg bid, metoprolol succ 12.5 mg qam  Social Hx: no alcohol, no tobacco  Diet: cereal with fruit for breakfast most days (cheerios, almond milk); lunch - doesn't eat much, breakfast is not until 10-10:30 am; not a lot of meats, some protein from nuts; tyring to cut back on sugar; sometimes veggies with  dinner; tries to avoid pastas and breads  Exercise: some PT after endart; does silver Sneakers twice weekly - 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  Home BP readings: home - arm - cuff up to 81 years old; validated 6/21 PharmD appt  Intolerances: hydrochlorothiazide - leg pain, "couldn't function"   Labs: 10/22:  Na 137, K 4.3, Glu 161, BUN 22, SCr 1.30 GFR 55   Wt Readings from Last 3 Encounters:  05/26/21 140 lb (63.5 kg)  03/31/21 153 lb (69.4 kg)  03/03/21 153 lb 6.4 oz (69.6 kg)   BP Readings from Last 3 Encounters:  05/26/21 (!) 150/74  03/31/21 (!) 160/72  03/03/21 (!) 190/102   Pulse Readings from Last 3 Encounters:  05/26/21 78  03/31/21 72  03/03/21 75    Current Outpatient Medications  Medication Sig Dispense Refill   amLODipine (NORVASC) 2.5 MG tablet Take 2 tablets ( 5 mg ) every night 180 tablet 3   aspirin EC 81 MG tablet Take 81 mg by mouth daily.     carvedilol (COREG) 6.25 MG tablet Take 1 tablet (6.25 mg total) by mouth 2 (two) times daily. 180 tablet 3   hydrALAZINE (APRESOLINE) 25 MG tablet Take 1 tablet (25 mg total) by mouth in the morning and at bedtime. 60 tablet 3   irbesartan (AVAPRO) 150 MG tablet Take 1&1/2 tablets daily     Omega 3-6-9 Fatty Acids (OMEGA 3-6-9 COMPLEX PO) Take by mouth.     rosuvastatin (CRESTOR) 10 MG tablet Take 1 tablet (10 mg total) by mouth daily. 90 tablet 3   vitamin B-12 (CYANOCOBALAMIN) 1000 MCG tablet Take 1,000 mcg by mouth daily.     vitamin C (ASCORBIC ACID) 500 MG tablet Take 500 mg by mouth daily.     No current facility-administered medications for this visit.    Allergies  Allergen Reactions   Hydrochlorothiazide     Other reaction(s): Other (See Comments) Leg pain     Past Medical History:  Diagnosis Date   Carotid artery occlusion    Complication of anesthesia    pt states following last surgery he was awake for over 24 hours following surgery   Hypertension    Stroke ([facility])     Blood  pressure (!) 150/74, pulse 78, resp. rate 18, height 5\' 6"  (1.676 m), weight 140 lb (63.5 kg), SpO2 99 %.  Essential hypertension Patient with essential hypertension, still not at goal.  Had long discussion about the need for 3-4 medications to get most patients to goal, and he is not at the higher doses of any of his.  He is always hesitant to increase medications or add new, so for  now will have him switch metoprolol succ 12.5 mg to carvedilol 6.25 mg bid.  HR stable in the 70's in office today.  Hopefully we can get some better BP effect from this.  Will see him back in 2 months for follow up.    Phillips Hay PharmD CPP [facility] [address] [place], [place] [address] [phone]

## 2021-06-26 ENCOUNTER — Other Ambulatory Visit: Payer: Self-pay | Admitting: Cardiovascular Disease

## 2021-07-28 ENCOUNTER — Other Ambulatory Visit: Payer: Self-pay | Admitting: *Deleted

## 2021-07-28 DIAGNOSIS — I6523 Occlusion and stenosis of bilateral carotid arteries: Secondary | ICD-10-CM

## 2021-07-29 ENCOUNTER — Ambulatory Visit: Payer: Medicare Other

## 2021-07-30 ENCOUNTER — Encounter: Payer: Self-pay | Admitting: Pharmacist Clinician (PhC)/ Clinical Pharmacy Specialist

## 2021-07-30 ENCOUNTER — Ambulatory Visit (INDEPENDENT_AMBULATORY_CARE_PROVIDER_SITE_OTHER): Payer: Medicare Other | Admitting: Pharmacist Clinician (PhC)/ Clinical Pharmacy Specialist

## 2021-07-30 DIAGNOSIS — I10 Essential (primary) hypertension: Secondary | ICD-10-CM

## 2021-07-30 MED ORDER — AMLODIPINE BESYLATE 2.5 MG PO TABS: 7.5000 mg | ORAL_TABLET | Freq: Every day | ORAL | 3 refills | Status: DC

## 2021-07-30 NOTE — Assessment & Plan Note (Signed)
Patient with essential hypertension not yet to goal, but improving.  Will have him discontinue metoprolol, as he is doing well with carvedilol 6.25 mg dose.  Also will increase amlodipine to 7.5 mg each evening.  Continue with other medications.  We will see him back in 3 months for follow up.

## 2021-07-30 NOTE — Patient Instructions (Signed)
Return for a a follow up appointment in October 26 at 9:30 am  Check your blood pressure at home daily (if able) and keep record of the readings.  Take your BP meds as follows:   STOP METOPROLOL.    Increase amlodipine to 3 tablets (7.5 mg total) every evening.  Bring all of your meds, your BP cuff and your record of home blood pressures to your next appointment.  Exercise as you're able, try to walk approximately 30 minutes per day.  Keep salt intake to a minimum, especially watch canned and prepared boxed foods.  Eat more fresh fruits and vegetables and fewer canned items.  Avoid eating in fast food restaurants.    HOW TO TAKE YOUR BLOOD PRESSURE: Rest 5 minutes before taking your blood pressure.  Don't smoke or drink caffeinated beverages for at least 30 minutes before. Take your blood pressure before (not after) you eat. Sit comfortably with your back supported and both feet on the floor (don't cross your legs). Elevate your arm to heart level on a table or a desk. Use the proper sized cuff. It should fit smoothly and snugly around your bare upper arm. There should be enough room to slip a fingertip under the cuff. The bottom edge of the cuff should be 1 inch above the crease of the elbow. Ideally, take 3 measurements at one sitting and record the average.

## 2021-07-30 NOTE — Progress Notes (Signed)
07/30/2021 [facility] 25-Jan-1940 [id number]   HPI:  Jonathan Savage is a 81 y.o. male patient of Dr Allyson Sabal, with a PMH below who presents today for hypertension clinic evaluation.  He was last seen by Dr. Allyson Sabal in early January, at which time his blood pressure was noted to be 150/72.  No changes were made at that time and he was asked to monitor and log home readings.   He has been seen by both Laural Golden and Malena Peer in the past as well.  At his last visit with me we had discussion about needing to keep medications unchanged and that the mg doses were not equivalent from one medication to the next.  He recalled being on a "brand name" medication that worked when he lived in [place], so [facility], but was only able to find losartan 100 mg and amlodipine 5 mg.  I asked that he continue with irbesartan 225 mg and restart the amlodipine.  Suggested he start with 2.5 mg and take daily for 1 week, then increase to 5 mg daily.  He was asked to keep home BP log and bring with him.  Over last few visits started hydralazine 25 mg twice daily and amlodipine, and switched metoprolol to carvedilol.  At follow up had no noticeable changes since starting hydralazine.  BP readings at home are 130's in the evenings, but run as high as 160 in the mornings.  He forgot the list this morning.  He wants to know if we can try switching some of his medications on ones that will work better.  Explained that he has tried many medicines, and we are not at the high doses of what he is taking, so we can't say the medications are inefficient at this time.  Also explained that if often takes 3-4 medications to reach goal.  Today he returns for follow up.  He gets medications confused easily.  Since last visit, did not re-start the amlodipine until about 2 weeks ago, and did not stop metoprolol after adding carvedilol.     BP Medication adjustment history:  06/05/19 - JB 160/86 -  losartan 100  // keep 30 d log and see PharmD 06/22/19 - call - 174/101 - added amlodipine 5 07/03/19 - CP 156/76 -  took amlodipine 5 x 1 then cut to 2.5 daily (5 caused insomnia); states unsteady on medications) - increased to amlodipine 5 07/19/19 - CP 144/80 - increased amlodipine to 7.5  10/24/19 - JB 158/78 - no change - amlodipine 7.5, losartan 100 12/07/19 - JB 142/69 - no change 09/11/20 - MM - 146/70 - switch losartan to irbesartan 150  09/23/20 - MM 120/60 - no change 09/25/20 - call 70/40 - syncopal episode, hold meds x 2 days, then decrease irbesartan to 75 mg, continue amlodipine 10/03/20 - MM 140/70 - increase amlodipine to 10 mg, cont irbesartan 75 11/03/20 - [name] - increase irbesartan to 150, decrease amlodipine to 2.5 11/12/20 - [name] - increase amlodipine to 5 mg, continue irbesartan 150 12/01/20 - [name] 150/70 - increase irbesartan to 225, "decrease amlodipine to 5 mg" 12/04/20 - [name] 161/94 - no change, continue to monitor 12/17/20 [name] - increase amlodipine to 10 mg 01/07/21 - [name] - add hctz 12.5 mg x 1 week then increase to 25 mg qd, stop amlodipine, continue irbesartan 225  01/08/21 - JB 150/72 - no change, f/u with PharmD 01/21/21 -  [name] - stopped hctz, didn't tolerate, continue irbesartan 225, amlodipine 7.5 01/30/21 - [name] walk in - 158/92 - did not continue amlodipine, so instead start metoprolol succ 25 02/24/21- [name] - advised to f/u with Cardiology HTN clinic as pt not tolerating what she has prescribed.   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  PVD claudication  stroke ~10 years ago, some left side extremity weakness     Blood Pressure Goal:  130/80  Current Medications: irbesartan 225 mg qd (am), amlodipine 5 mg qd (pm) hydralazine 25 mg bid, carvedilol 6.25 mg bid  Social Hx: no alcohol, no tobacco  Diet: cereal with fruit for breakfast most days  (cheerios, almond milk); lunch - doesn't eat much, breakfast is not until 10-10:30 am; not a lot of meats, some protein from nuts; tyring to cut back on sugar; sometimes veggies with dinner; tries to avoid pastas and breads  Exercise: some PT after endart; does silver Sneakers twice weekly - 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  Very few at goal  Intolerances: hydrochlorothiazide - leg pain, "couldn't function"   Labs: 10/22:  Na 137, K 4.3, Glu 161, BUN 22, SCr 1.30 GFR 55   Wt Readings from Last 3 Encounters:  05/26/21 140 lb (63.5 kg)  03/31/21 153 lb (69.4 kg)  03/03/21 153 lb 6.4 oz (69.6 kg)   BP Readings from Last 3 Encounters:  07/30/21 136/64  05/26/21 (!) 150/74  03/31/21 (!) 160/72   Pulse Readings from Last 3 Encounters:  07/30/21 (!) 56  05/26/21 78  03/31/21 72    Current Outpatient Medications  Medication Sig Dispense Refill   amLODipine (NORVASC) 2.5 MG tablet Take 3 tablets (7.5 mg total) by mouth daily. 270 tablet 3   aspirin EC 81 MG tablet Take 81 mg by mouth daily.     carvedilol (COREG) 6.25 MG tablet Take 1 tablet (6.25 mg total) by mouth 2 (two) times daily. 180 tablet 3   hydrALAZINE (APRESOLINE) 25 MG tablet TAKE 1 TABLET (25 MG) BY MOUTH IN THE MORNING AND AT BEDTIME 180 tablet 3   irbesartan (AVAPRO) 150 MG tablet Take 1&1/2 tablets daily     Omega 3-6-9 Fatty Acids (OMEGA 3-6-9 COMPLEX PO) Take by mouth.     rosuvastatin (CRESTOR) 10 MG tablet Take 1 tablet (10 mg total) by mouth daily. 90 tablet 3   vitamin B-12 (CYANOCOBALAMIN) 1000 MCG tablet Take 1,000 mcg by mouth daily.     vitamin C (ASCORBIC ACID) 500 MG tablet Take 500 mg by mouth daily.     No current facility-administered medications for this visit.    Allergies  Allergen Reactions   Hydrochlorothiazide     Other reaction(s):  Other (See Comments) Leg pain     Past Medical History:  Diagnosis Date   Carotid artery occlusion    Complication of anesthesia    pt states following last surgery he was awake for over 24 hours following surgery   Hypertension    Stroke (HCC)     Blood pressure 136/64, pulse (!) 56.  Essential hypertension Patient with essential hypertension not yet to goal, but improving.  Will have him discontinue metoprolol, as he is doing well with carvedilol 6.25 mg dose.  Also will increase amlodipine to 7.5 mg each evening.  Continue with other medications.  We will see him back in 3 months for follow up.    Phillips Hay PharmD CPP [facility] [address] [place], [place] [address] [phone]

## 2021-08-14 NOTE — Progress Notes (Unsigned)
Office Note     CC:  follow up Requesting Provider:  Roger Kill, *  HPI: Jonathan Savage is a 81 y.o. (01/30/1940) male who presents for routine follow up of carotid artery disease. He is s/p left carotid endarterectomy with patch angioplasty 08/22/2019 by Dr. Myra Gianotti for high grade asymptomatic stenosis.    He says overall he is doing well. He does report some generalized fatigue and tiredness in his legs over past several months. Also has some unsteadiness/ balance issues. He denies any amaurosis fugax or other visual changes, slurred speech, facial drooping, unilateral upper or lower extremity weakness or numbness. He does have some left hand numbness and tingling but this is from many years ago when he had a stroke. He does not describe claudication, rest pain or tissue loss. He does get leg cramps at night from time to time but no pain that otherwise wakes him from sleep. He does not have any swelling in his legs.  He remains very active. 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    The pt is on a statin for cholesterol management.  The pt is on a daily aspirin.   Other AC:  none The pt is on CCB, BB, ARB for hypertension.   The pt is not diabetic.   Tobacco hx:  Former  Past Medical History:  Diagnosis Date   Carotid artery occlusion    Complication of anesthesia    pt states following last surgery he was awake for over 24 hours following surgery   Hypertension    Stroke [facility])     Past Surgical History:  Procedure Laterality Date   ENDARTERECTOMY Left 08/22/2019   Procedure: LEFT CAROTID ENDARTERECTOMY WITH BOVIE PATCH ANGIOPLASTY;  Surgeon: Nada Libman, MD;  Location: [facility] OR;  Service: Vascular;  Laterality: Left;   LAPAROSCOPIC NEPHRECTOMY Right    NECK SURGERY     PROSTATE SURGERY      Social History   Socioeconomic History   Marital status: Widowed    Spouse name: Not on file   Number of children: 3   Years of  education: Not on file   Highest education level: Not on file  Occupational History   Not on file  Tobacco Use   Smoking status: Former   Smokeless tobacco: Never  Vaping Use   Vaping Use: Never used  Substance and Sexual Activity   Alcohol use: Never   Drug use: Never   Sexual activity: Not on file  Other Topics Concern   Not on file  Social History Narrative   Right Handed    Lives in a two story home    Father to 3 girls    Social Determinants of Health   Financial Resource Strain: Not on file  Food Insecurity: Not on file  Transportation Needs: Not on file  Physical Activity: Not on file  Stress: Not on file  Social Connections: Not on file  Intimate Partner Violence: Not on file    Family History  Problem Relation Age of Onset   Dementia Mother    Heart failure Father        Deteriration of the heart   Stroke Brother     Current Outpatient Medications  Medication Sig Dispense Refill   amLODipine (NORVASC) 2.5 MG tablet Take 3 tablets (7.5 mg total) by mouth daily. 270 tablet 3   aspirin EC 81 MG tablet Take 81 mg by mouth daily.  carvedilol (COREG) 6.25 MG tablet Take 1 tablet (6.25 mg total) by mouth 2 (two) times daily. 180 tablet 3   hydrALAZINE (APRESOLINE) 25 MG tablet TAKE 1 TABLET (25 MG) BY MOUTH IN THE MORNING AND AT BEDTIME 180 tablet 3   irbesartan (AVAPRO) 150 MG tablet Take 1&1/2 tablets daily     Omega 3-6-9 Fatty Acids (OMEGA 3-6-9 COMPLEX PO) Take by mouth.     rosuvastatin (CRESTOR) 10 MG tablet Take 1 tablet (10 mg total) by mouth daily. 90 tablet 3   vitamin B-12 (CYANOCOBALAMIN) 1000 MCG tablet Take 1,000 mcg by mouth daily.     vitamin C (ASCORBIC ACID) 500 MG tablet Take 500 mg by mouth daily.     No current facility-administered medications for this visit.    Allergies  Allergen Reactions   Hydrochlorothiazide     Other reaction(s): Other (See Comments) Leg pain      REVIEW OF SYSTEMS:  [X]  denotes positive finding, [ ]   denotes negative finding Cardiac  Comments:  Chest pain or chest pressure:    Shortness of breath upon exertion:    Short of breath when lying flat:    Irregular heart rhythm:        Vascular    Pain in calf, thigh, or hip brought on by ambulation:    Pain in feet at night that wakes you up from your sleep:     Blood clot in your veins:    Leg swelling:         Pulmonary    Oxygen at home:    Productive cough:     Wheezing:         Neurologic    Sudden weakness in arms or legs:     Sudden numbness in arms or legs:     Sudden onset of difficulty speaking or slurred speech:    Temporary loss of vision in one eye:     Problems with dizziness:         Gastrointestinal    Blood in stool:     Vomited blood:         Genitourinary    Burning when urinating:     Blood in urine:        Psychiatric    Major depression:         Hematologic    Bleeding problems:    Problems with blood clotting too easily:        Skin    Rashes or ulcers:        Constitutional    Fever or chills:      PHYSICAL EXAMINATION:  Vitals:   08/17/21 1026 08/17/21 1029  BP: 123/61 (!) 131/58  Pulse: (!) 57 (!) 56  Resp: 16   Temp: 97.9 F (36.6 C)   TempSrc: Temporal   SpO2: 98%   Weight: 136 lb (61.7 kg)   Height: 5\' 6"  (1.676 m)     General:  WDWN in NAD; vital signs documented above Gait: Normal HENT: WNL, normocephalic Pulmonary: normal non-labored breathing , without wheezing Cardiac: regular HR, without  Murmurs without carotid bruit Abdomen: soft, NT, no masses Vascular Exam/Pulses: 2+ radial pulses bilaterally; 2+ femoral, 2+ Dp and PT pulses bilaterally. Feet are warm and well perfused Extremities: without ischemic changes, without Gangrene , without cellulitis; without open wounds;  Musculoskeletal: no muscle wasting or atrophy  Neurologic: A&O X 3;  No focal weakness or paresthesias are detected Psychiatric:  The pt has Normal affect.  Non-Invasive Vascular Imaging:    Carotid Duplex: 08/17/21 Summary:  Right Carotid: Velocities in the right ICA are consistent with a 40-59% stenosis.   Left Carotid: Velocities in the left ICA are consistent with a 1-39% stenosis.   Vertebrals: Bilateral vertebral arteries demonstrate antegrade flow.     ASSESSMENT/PLAN:: 81 y.o. male here for follow up for carotid artery disease. He is s/p left carotid endarterectomy with patch angioplasty 08/22/2019 by Dr. Myra Gianotti for high grade asymptomatic stenosis. Duplex today shows Right ICA stenosis of 40-59%, which is unchanged from 1 year ago. Left ICA of 1-39% post CEA. Normal flow in the vertebral and subclavian arteries. - he remains neurologically intact with no associated symptoms - he has been feeling generalized weakness and fatigue in his legs however I do not think this is associated to any arterial diease. He does not describe typical claudication symptoms and clinically he has easily palpable pedal pulses bilaterally - recommend that he follow up with his PCP if he continues to have issues with dizziness/ unsteadiness and generalized fatigue  - I have encouraged him to remain active - continue  Aspirin and statin - he will follow up in 1 year with repeat carotid duplex. He knows to follow up sooner should he have new or concerning symptoms    Graceann Congress, PA-C [facility] [phone]  Clinic MD:   Myra Gianotti

## 2021-08-17 ENCOUNTER — Ambulatory Visit (INDEPENDENT_AMBULATORY_CARE_PROVIDER_SITE_OTHER): Payer: Medicare Other | Admitting: Physician Assistant

## 2021-08-17 ENCOUNTER — Ambulatory Visit (HOSPITAL_COMMUNITY)
Admission: RE | Admit: 2021-08-17 | Discharge: 2021-08-17 | Disposition: A | Discharge status: Home / Self Care | Payer: Medicare Other | Source: Ambulatory Visit | Attending: Surgery | Admitting: Surgery

## 2021-08-17 VITALS — BP 131/58 | HR 56 | Temp 97.9°F | Resp 16 | Ht 66.0 in | Wt 136.0 lb

## 2021-08-17 DIAGNOSIS — I6523 Occlusion and stenosis of bilateral carotid arteries: Secondary | ICD-10-CM

## 2021-08-17 DIAGNOSIS — Z9889 Other specified postprocedural states: Secondary | ICD-10-CM | POA: Diagnosis not present

## 2021-10-29 ENCOUNTER — Ambulatory Visit: Payer: Medicare Other | Attending: Cardiology | Admitting: Pharmacist

## 2021-10-29 VITALS — BP 150/68 | HR 70 | Wt 137.4 lb

## 2021-10-29 DIAGNOSIS — E782 Mixed hyperlipidemia: Secondary | ICD-10-CM

## 2021-10-29 DIAGNOSIS — I10 Essential (primary) hypertension: Secondary | ICD-10-CM

## 2021-10-29 DIAGNOSIS — R634 Abnormal weight loss: Secondary | ICD-10-CM | POA: Insufficient documentation

## 2021-10-29 DIAGNOSIS — R7303 Prediabetes: Secondary | ICD-10-CM | POA: Insufficient documentation

## 2021-10-29 DIAGNOSIS — I6523 Occlusion and stenosis of bilateral carotid arteries: Secondary | ICD-10-CM

## 2021-10-29 MED ORDER — AMLODIPINE BESYLATE 2.5 MG PO TABS: 10.0000 mg | ORAL_TABLET | Freq: Every day | ORAL | 3 refills | Status: DC

## 2021-10-29 NOTE — Progress Notes (Signed)
Patient ID: [name] [facility]                 DOB: December 11, 1940                      MRN: [id number]     HPI: Jonathan Savage is a 81 y.o. male referred by Dr. Gwenlyn Found to HTN clinic. PMH is significant for HTN, carotid stenosis, pre DM, and HLD. This is patient's 5th visit to HTN clinic this year and medications have been slowly titrated.  Patient presents today in good spirits. Has both metoprolol and carvedilol on list. Is not sure if he is taking metoprolol. Voices confusion with medication therapy.  No longer has a PCP, needs referral.  Current HTN meds:  Carvedilol 6.25mg  BID Hydralazine 25mg  BID Irbesartan 225mg  daily Metoprolol XL 25mg  daily    Wt Readings from Last 3 Encounters:  10/29/21 137 lb 6.4 oz (62.3 kg)  08/17/21 136 lb (61.7 kg)  05/26/21 140 lb (63.5 kg)   BP Readings from Last 3 Encounters:  10/29/21 (!) 150/68  08/17/21 (!) 131/58  07/30/21 136/64   Pulse Readings from Last 3 Encounters:  10/29/21 70  08/17/21 (!) 56  07/30/21 (!) 56    Renal function: CrCl cannot be calculated (Patient's most recent lab result is older than the maximum 21 days allowed.).  Past Medical History:  Diagnosis Date   Carotid artery occlusion    Complication of anesthesia    pt states following last surgery he was awake for over 24 hours following surgery   Hypertension    Stroke [facility])     Current Outpatient Medications on File Prior to Visit  Medication Sig Dispense Refill   aspirin EC 81 MG tablet Take 81 mg by mouth daily.     carvedilol (COREG) 6.25 MG tablet Take 1 tablet (6.25 mg total) by mouth 2 (two) times daily. 180 tablet 3   hydrALAZINE (APRESOLINE) 25 MG tablet TAKE 1 TABLET (25 MG) BY MOUTH IN THE MORNING AND AT BEDTIME 180 tablet 3   irbesartan (AVAPRO) 150 MG tablet Take 1&1/2 tablets daily     Omega 3-6-9 Fatty Acids (OMEGA 3-6-9 COMPLEX PO) Take by mouth.     rosuvastatin (CRESTOR) 10 MG tablet Take 1 tablet (10 mg total) by mouth daily. 90  tablet 3   vitamin B-12 (CYANOCOBALAMIN) 1000 MCG tablet Take 1,000 mcg by mouth daily.     vitamin C (ASCORBIC ACID) 500 MG tablet Take 500 mg by mouth daily.     No current facility-administered medications on file prior to visit.    Allergies  Allergen Reactions   Hydrochlorothiazide     Other reaction(s): Other (See Comments) Leg pain      Assessment/Plan:  1. Hypertension - Patient BP remains uncontrolled. Likely confusion regarding regimen is contributing. Unclear if patient discontinued metoprolol. Will have him d/c at this time and continue with carvedilol. Will increase amlodipine to 10mg  at this time. Since patient reports he has many 2.5mg  tablets will have him increase to 4 once daily and when he is low, will switch to one 10mg  tablet.   Referral placed to primary care and will take patient to front desk to schedule follow up with Dr Gwenlyn Found. Recheck in 4 weeks.   [name], PharmD, BCACP, [place], [place], [place], [place], [address] Phone: [phone], Fax: [phone]

## 2021-10-29 NOTE — Patient Instructions (Addendum)
It was nice seeing you again  We would like your blood pressure to be less than 140/90.    Please continue your:  Carvedilol 6.25mg  twice a day Hydralazine 25mg  twice a day Irbesartan 1 and 1/2 tablets once a day  Please increase your amlodipine to 10mg  once a day.  You can take 4 of your 2.5mg  tablets.  I have placed a referral to primary care for you  We will also schedule your follow up with Dr Sheilah Pigeon, PharmD, [place], [place], [place], [place], [place], [address] Phone: [phone], Fax: [phone]

## 2021-11-05 ENCOUNTER — Encounter: Payer: Self-pay | Admitting: Pharmacist

## 2021-12-07 NOTE — Progress Notes (Signed)
Patient ID: Jonathan Savage [facility]                 DOB: 01/20/1940                      MRN: [id number]     HPI: Jonathan Savage is a 81 y.o. male referred by Dr. Gwenlyn Found to HTN clinic. PMH is significant for PMH is significant for HTN, carotid stenosis, pre DM, and HLD.    Today patient has no acute concern. He roports his heart rate are stying in range of 50-60 range specialy in the morning. He takes his medications regularly and tolerates them well without side effects. Denies any dizziness, headaches,palpitation, syncope , chest pain,SOB or swelling.  Reports his home cuff has been validate by one of the pharmacist in the past and home reading averages to 130/61 with heart  rate 55 after amlodipine dose increment (from 7.5 mg daily to 10 mg daily). Reports his BP is always high at the office visit as he is anxious. Use to go to the group exercise twice a week but have not been going. Encourage to restart regular physical activity at slow pace and work your way up.  Current HTN meds:  Carvedilol 6.75m BID Hydralazine 275mBID Irbesartan 22563maily Amlodipine 10 mg daily  Previously tried: HCTZ - severe leg pain BP goal: 130/80     Social History:  no alcohol, no tobacco  Diet: low salt, lately his diet is not that great but will be going to his healthy eating habits   Exercise: none - but willing to go to gym again at least twice week    Home BP readings: from last 2 weeks  SBP DBP HR  132 64 63  128 62 55  134 63 51  133 61 53  126 61 54  136 60 54  135 64 55  132 61 65  126 $ 58 55  116 59 54  132 59 54  129 60 58  129.9167 61 55.91667    Wt Readings from Last 3 Encounters:  10/29/21 137 lb 6.4 oz (62.3 kg)  08/17/21 136 lb (61.7 kg)  05/26/21 140 lb (63.5 kg)   BP Readings from Last 3 Encounters:  12/09/21 (!) 150/66  10/29/21 (!) 150/68  08/17/21 (!) 131/58   Pulse Readings from Last 3 Encounters:  12/09/21 70  10/29/21 70  08/17/21 (!) 56      Latest  Ref Rng & Units 10/03/2020    9:46 AM 09/25/2020    1:11 PM 08/23/2019    2:45 AM  BMP  Glucose 70 - 99 mg/dL 107  94  152   BUN 8 - 27 mg/dL 18  24  17   $ Creatinine 0.76 - 1.27 mg/dL 1.19  1.42  1.28   BUN/Creat Ratio 10 - 24 15     Sodium 134 - 144 mmol/L 142  139  138   Potassium 3.5 - 5.2 mmol/L 4.3  4.2  4.3   Chloride 96 - 106 mmol/L 101  105  105   CO2 20 - 29 mmol/L 27  27  25   $ Calcium 8.6 - 10.2 mg/dL 9.8  9.0  9.2     Renal function: due for lab, last BMP 10/03/2020 CrCl: 43 ml/min   Past Medical History:  Diagnosis Date   Carotid artery occlusion    Complication of anesthesia    pt states following last surgery he was awake  for over 24 hours following surgery   Hypertension    Stroke [facility])     Current Outpatient Medications on File Prior to Visit  Medication Sig Dispense Refill   aspirin EC 81 MG tablet Take 81 mg by mouth daily.     carvedilol (COREG) 6.25 MG tablet Take 1 tablet (6.25 mg total) by mouth 2 (two) times daily. 180 tablet 3   hydrALAZINE (APRESOLINE) 25 MG tablet TAKE 1 TABLET (25 MG) BY MOUTH IN THE MORNING AND AT BEDTIME 180 tablet 3   irbesartan (AVAPRO) 150 MG tablet Take 1&1/2 tablets daily     Omega 3-6-9 Fatty Acids (OMEGA 3-6-9 COMPLEX PO) Take by mouth.     rosuvastatin (CRESTOR) 10 MG tablet Take 1 tablet (10 mg total) by mouth daily. 90 tablet 3   vitamin B-12 (CYANOCOBALAMIN) 1000 MCG tablet Take 1,000 mcg by mouth daily.     vitamin C (ASCORBIC ACID) 500 MG tablet Take 500 mg by mouth daily.     No current facility-administered medications on file prior to visit.    Allergies  Allergen Reactions   Hydrochlorothiazide     Other reaction(s): Other (See Comments) Leg pain     Blood pressure (!) 150/66, pulse 70, SpO2 98 %.   Assessment/Plan:  1. Hypertension -  Essential hypertension Assessment: In office BP 150/66 mmHg above the goal (<130/80) but home BP averages 130/61 with heart rate 55 on validated cuff Patient reports  he has white coat syndrome  The pulse are in below 60 but above 50 range in the morning and makes him tired  Takes all his BP medications regularly and tolerates them well without side effects  Denies any dizziness, headaches,palpitation, syncope , chest pain,SOB or swelling Given white coat syndrome and soft BP at home on validate cuff not changing any medications dose but will split the amlodipine dose twice daily to avoid too low readings in the morning  Reiderated the importance of regular exercise and low salt diet   Plan:  Start taking amlodipine 5 mg twice daily instead of 10 mg at bedtime Continue taking carvedilol 6.25 mg twice daily, hydralazine 25 mg twice daily irbesratan 225 mg daily  Patient to keep record of BP readings with heart rate and report [place] if it persistently stays above the goal (>130/80) One year follow up with Dr. Gwenlyn Found is due in Jan 2024  Thank you  Cammy Copa, Pharm.D [facility] A Division of [facility] [address] [place], [place] [address]  Phone: [phone]; Fax: [phone]

## 2021-12-08 ENCOUNTER — Encounter: Payer: Self-pay | Admitting: Cardiovascular Disease

## 2021-12-09 ENCOUNTER — Ambulatory Visit: Payer: Medicare Other | Attending: Cardiology | Admitting: Student

## 2021-12-09 VITALS — BP 150/66 | HR 70

## 2021-12-09 DIAGNOSIS — I10 Essential (primary) hypertension: Secondary | ICD-10-CM | POA: Diagnosis not present

## 2021-12-09 DIAGNOSIS — I6523 Occlusion and stenosis of bilateral carotid arteries: Secondary | ICD-10-CM | POA: Diagnosis not present

## 2021-12-09 MED ORDER — AMLODIPINE BESYLATE 5 MG PO TABS: 5.0000 mg | ORAL_TABLET | Freq: Two times a day (BID) | ORAL | 3 refills | Status: DC

## 2021-12-09 NOTE — Assessment & Plan Note (Addendum)
Assessment: In office BP 150/66 mmHg above the goal (<130/80) but home BP averages 130/61 with heart rate 55 on validated cuff Patient reports he has white coat syndrome  The pulse are in below 60 but above 50 range in the morning and makes him tired  Takes all his BP medications regularly and tolerates them well without side effects  Denies any dizziness, headaches,palpitation, syncope , chest pain,SOB or swelling Given white coat syndrome and soft BP at home on validate cuff not changing any medications dose but will split the amlodipine dose twice daily to avoid too low readings in the morning  Reiderated the importance of regular exercise and low salt diet   Plan:  Start taking amlodipine 5 mg twice daily instead of 10 mg at bedtime Continue taking carvedilol 6.25 mg twice daily, hydralazine 25 mg twice daily irbesratan 225 mg daily  Patient to keep record of BP readings with heart rate and report [place] if it persistently stays above the goal (>130/80) One year follow up with Dr. Allyson Sabal is due in Jan 2024

## 2021-12-09 NOTE — Patient Instructions (Signed)
Changes made by your pharmacist Carmela Hurt, PharmD at today's visit:    Instructions/Changes  (what do you need to do) Your Notes  (what you did and when you did it)  Start taking amlodipine 5 mg twice daily instead of taking 10 mg at night to avoid too low BP in the morning    2.  Continue taking carvedilol 6.25 mg twice daily, irbesartan 225 mg every morning, and Hydralazine 25 mg twice daily    3. Start doing regular exercise 2 days of week     Bring all of your meds, your BP cuff and your record of home blood pressures to your next appointment.    HOW TO TAKE YOUR BLOOD PRESSURE AT HOME  Rest 5 minutes before taking your blood pressure.  Don't smoke or drink caffeinated beverages for at least 30 minutes before. Take your blood pressure before (not after) you eat. Sit comfortably with your back supported and both feet on the floor (don't cross your legs). Elevate your arm to heart level on a table or a desk. Use the proper sized cuff. It should fit smoothly and snugly around your bare upper arm. There should be enough room to slip a fingertip under the cuff. The bottom edge of the cuff should be 1 inch above the crease of the elbow. Ideally, take 3 measurements at one sitting and record the average.  Important lifestyle changes to control high blood pressure  Intervention  Effect on the BP  Lose extra pounds and watch your waistline Weight loss is one of the most effective lifestyle changes for controlling blood pressure. If you're overweight or obese, losing even a small amount of weight can help reduce blood pressure. Blood pressure might go down by about 1 millimeter of mercury (mm Hg) with each kilogram (about 2.2 pounds) of weight lost.  Exercise regularly As a general goal, aim for at least 30 minutes of moderate physical activity every day. Regular physical activity can lower high blood pressure by about 5 to 8 mm Hg.  Eat a healthy diet Eating a diet rich in whole  grains, fruits, vegetables, and low-fat dairy products and low in saturated fat and cholesterol. A healthy diet can lower high blood pressure by up to 11 mm Hg.  Reduce salt (sodium) in your diet Even a small reduction of sodium in the diet can improve heart health and reduce high blood pressure by about 5 to 6 mm Hg.  Limit alcohol One drink equals 12 ounces of beer, 5 ounces of wine, or 1.5 ounces of 80-proof liquor.  Limiting alcohol to less than one drink a day for women or two drinks a day for men can help lower blood pressure by about 4 mm Hg.   If you have any questions or concerns please use My Chart to send questions or call the office at [phone]

## 2021-12-10 ENCOUNTER — Ambulatory Visit: Payer: Medicare Other

## 2022-01-07 ENCOUNTER — Ambulatory Visit (HOSPITAL_BASED_OUTPATIENT_CLINIC_OR_DEPARTMENT_OTHER): Payer: Medicare Other | Admitting: Family Medicine

## 2022-02-15 ENCOUNTER — Ambulatory Visit (HOSPITAL_BASED_OUTPATIENT_CLINIC_OR_DEPARTMENT_OTHER): Payer: Medicare Other | Admitting: Family Medicine

## 2022-02-17 ENCOUNTER — Other Ambulatory Visit: Payer: Self-pay | Admitting: Cardiovascular Disease

## 2022-02-17 DIAGNOSIS — E782 Mixed hyperlipidemia: Secondary | ICD-10-CM

## 2022-03-03 ENCOUNTER — Encounter (HOSPITAL_COMMUNITY): Payer: Self-pay

## 2022-03-03 ENCOUNTER — Emergency Department (HOSPITAL_COMMUNITY): Payer: Medicare Other

## 2022-03-03 ENCOUNTER — Emergency Department (HOSPITAL_COMMUNITY)
Admission: EM | Admit: 2022-03-03 | Discharge: 2022-03-03 | Disposition: A | Discharge status: Home / Self Care | Payer: Medicare Other | Attending: Emergency Medicine | Admitting: Emergency Medicine

## 2022-03-03 ENCOUNTER — Other Ambulatory Visit: Payer: Self-pay

## 2022-03-03 DIAGNOSIS — Z7982 Long term (current) use of aspirin: Secondary | ICD-10-CM | POA: Diagnosis not present

## 2022-03-03 DIAGNOSIS — Z79899 Other long term (current) drug therapy: Secondary | ICD-10-CM | POA: Insufficient documentation

## 2022-03-03 DIAGNOSIS — Z8673 Personal history of transient ischemic attack (TIA), and cerebral infarction without residual deficits: Secondary | ICD-10-CM | POA: Diagnosis not present

## 2022-03-03 DIAGNOSIS — S0101XA Laceration without foreign body of scalp, initial encounter: Secondary | ICD-10-CM

## 2022-03-03 DIAGNOSIS — T887XXA Unspecified adverse effect of drug or medicament, initial encounter: Secondary | ICD-10-CM | POA: Diagnosis not present

## 2022-03-03 DIAGNOSIS — W01110A Fall on same level from slipping, tripping and stumbling with subsequent striking against sharp glass, initial encounter: Secondary | ICD-10-CM | POA: Insufficient documentation

## 2022-03-03 DIAGNOSIS — R001 Bradycardia, unspecified: Secondary | ICD-10-CM

## 2022-03-03 DIAGNOSIS — R55 Syncope and collapse: Secondary | ICD-10-CM

## 2022-03-03 DIAGNOSIS — Z23 Encounter for immunization: Secondary | ICD-10-CM | POA: Insufficient documentation

## 2022-03-03 DIAGNOSIS — I10 Essential (primary) hypertension: Secondary | ICD-10-CM | POA: Diagnosis not present

## 2022-03-03 DIAGNOSIS — Y92002 Bathroom of unspecified non-institutional (private) residence single-family (private) house as the place of occurrence of the external cause: Secondary | ICD-10-CM | POA: Insufficient documentation

## 2022-03-03 LAB — BASIC METABOLIC PANEL
Anion gap: 8 (ref 5–15)
BUN: 21 mg/dL (ref 8–23)
CO2: 25 mmol/L (ref 22–32)
Calcium: 9.6 mg/dL (ref 8.9–10.3)
Chloride: 102 mmol/L (ref 98–111)
Creatinine, Ser: 1.42 mg/dL — ABNORMAL HIGH (ref 0.61–1.24)
GFR, Estimated: 49 mL/min — ABNORMAL LOW (ref >60)
Glucose, Bld: 218 mg/dL — ABNORMAL HIGH (ref 70–99)
Potassium: 4.6 mmol/L (ref 3.5–5.1)
Sodium: 135 mmol/L (ref 135–145)

## 2022-03-03 LAB — CBC WITH DIFFERENTIAL/PLATELET
Abs Immature Granulocytes: 0.03 10*3/uL (ref 0.00–0.07)
Basophils Absolute: 0.1 10*3/uL (ref 0.0–0.1)
Basophils Relative: 1 %
Eosinophils Absolute: 0.1 10*3/uL (ref 0.0–0.5)
Eosinophils Relative: 1 %
HCT: 42.4 % (ref 39.0–52.0)
Hemoglobin: 14.2 g/dL (ref 13.0–17.0)
Immature Granulocytes: 0 %
Lymphocytes Relative: 10 %
Lymphs Abs: 1 10*3/uL (ref 0.7–4.0)
MCH: 30 pg (ref 26.0–34.0)
MCHC: 33.5 g/dL (ref 30.0–36.0)
MCV: 89.6 fL (ref 80.0–100.0)
Monocytes Absolute: 0.7 10*3/uL (ref 0.1–1.0)
Monocytes Relative: 7 %
Neutro Abs: 8.5 10*3/uL — ABNORMAL HIGH (ref 1.7–7.7)
Neutrophils Relative %: 81 %
Platelets: 248 10*3/uL (ref 150–400)
RBC: 4.73 MIL/uL (ref 4.22–5.81)
RDW: 13.1 % (ref 11.5–15.5)
WBC: 10.4 10*3/uL (ref 4.0–10.5)
nRBC: 0 % (ref 0.0–0.2)

## 2022-03-03 LAB — URINALYSIS, ROUTINE W REFLEX MICROSCOPIC
Bilirubin Urine: NEGATIVE
Glucose, UA: 100 mg/dL — AB
Hgb urine dipstick: NEGATIVE
Ketones, ur: 15 mg/dL — AB
Leukocytes,Ua: NEGATIVE
Nitrite: NEGATIVE
Protein, ur: 30 mg/dL — AB
Specific Gravity, Urine: 1.025 (ref 1.005–1.030)
pH: 6 (ref 5.0–8.0)

## 2022-03-03 LAB — URINALYSIS, MICROSCOPIC (REFLEX)

## 2022-03-03 LAB — CBG MONITORING, ED: Glucose-Capillary: 230 mg/dL — ABNORMAL HIGH (ref 70–99)

## 2022-03-03 LAB — TROPONIN I (HIGH SENSITIVITY): Troponin I (High Sensitivity): 6 ng/L (ref <18)

## 2022-03-03 MED ORDER — HALOPERIDOL LACTATE 5 MG/ML IJ SOLN: 5.0000 mg | INTRAMUSCULAR | Status: DC | PRN

## 2022-03-03 MED ORDER — TETANUS-DIPHTH-ACELL PERTUSSIS 5-2.5-18.5 LF-MCG/0.5 IM SUSY
0.5000 mL | PREFILLED_SYRINGE | Freq: Once | INTRAMUSCULAR | Status: AC
  Administered 2022-03-03: 0.5 mL via INTRAMUSCULAR
  Filled 2022-03-03: qty 0.5

## 2022-03-03 MED ORDER — LIDOCAINE-EPINEPHRINE-TETRACAINE (LET) TOPICAL GEL
3.0000 mL | Freq: Once | TOPICAL | Status: AC
  Administered 2022-03-03: 3 mL via TOPICAL
  Filled 2022-03-03: qty 3

## 2022-03-03 NOTE — ED Triage Notes (Signed)
Per EMS, Pt, from home, presents after a syncopal episode and fall.  Pt hit back of head.  3.5in lac noted.  No thinners.  Pt reports he was "feeling bad and decided to try to go to the bathroom."    Pt was ambulatory on scene when EMS arrived.

## 2022-03-03 NOTE — ED Notes (Signed)
Pt and daughter report he had a change in his HTN medication x1 month ago and he has had issue w/ low heartrate and low BP since. EDP made aware.

## 2022-03-03 NOTE — ED Provider Notes (Signed)
[facility] Provider Note   CSN: [id number] Arrival date & time: 03/03/22  1159     History  Chief Complaint  Patient presents with   Fall   Loss of Consciousness    Jonathan Savage is a 82 y.o. male.   Fall  Loss of Consciousness    82 year old male with medical history significant for hypertension on amlodipine, hydralazine and Coreg, prior CVA with residual left arm numbness who presents to the emergency department after syncopal episode.  The patient is not on anticoagulation.  He states that he was ambulating to the bathroom when he endorsed a feeling of lightheadedness, diaphoresis and generalized weakness.  No focal numbness or weakness, no facial droop, no dysarthria or dysphagia.  He denied any chest pain or shortness of breath or palpitations at the time.  While in the bathroom he syncopized, striking the back of his head on a glass fixture.  He sustained a 4 cm laceration.  He subsequently came to and has returned to his baseline ental status.  He was ambulatory on scene when EMS arrived, bleeding controlled, subsequently transported to the emergency department as a nonlevel trauma GCS 15, ABC intact.  He denies any other injuries or complaints on arrival beyond a head laceration.  He is not up-to-date on his tetanus.  Home Medications Prior to Admission medications   Medication Sig Start Date End Date Taking? Authorizing Provider  amLODipine (NORVASC) 5 MG tablet Take 1 tablet (5 mg total) by mouth in the morning and at bedtime. 12/09/21   Lorretta Harp, MD  aspirin EC 81 MG tablet Take 81 mg by mouth daily.    [provider]  hydrALAZINE (APRESOLINE) 25 MG tablet TAKE 1 TABLET (25 MG) BY MOUTH IN THE MORNING AND AT BEDTIME 06/26/21   Lorretta Harp, MD  irbesartan (AVAPRO) 150 MG tablet Take 1&1/2 tablets daily 03/13/21   Lorretta Harp, MD  Omega 3-6-9 Fatty Acids (OMEGA 3-6-9 COMPLEX PO) Take by mouth.     [provider]  rosuvastatin (CRESTOR) 10 MG tablet TAKE 1 TABLET BY MOUTH EVERY DAY 02/17/22   Lorretta Harp, MD  vitamin B-12 (CYANOCOBALAMIN) 1000 MCG tablet Take 1,000 mcg by mouth daily.    [provider]  vitamin C (ASCORBIC ACID) 500 MG tablet Take 500 mg by mouth daily.    [provider]      Allergies    Hydrochlorothiazide    Review of Systems   Review of Systems  Cardiovascular:  Positive for syncope.    Physical Exam Updated Vital Signs BP (!) 142/60   Pulse (!) 56   Temp 98 F (36.7 C)   Resp 20   Ht '5\' 7"'$  (1.702 m)   Wt 63.5 kg   SpO2 97%   BMI 21.93 kg/m  Physical Exam Vitals and nursing note reviewed.  Constitutional:      Appearance: He is well-developed.     Comments: GCS 15, ABC intact  HENT:     Head: Normocephalic.     Comments: 4cm laceration to the posterior occiput, hemostatic Eyes:     Conjunctiva/sclera: Conjunctivae normal.  Neck:     Comments: No midline tenderness to palpation of the cervical spine. ROM intact. Cardiovascular:     Rate and Rhythm: Normal rate and regular rhythm.     Pulses: Normal pulses.  Pulmonary:     Effort: Pulmonary effort is normal. No respiratory distress.  Breath sounds: Normal breath sounds.  Chest:     Comments: Chest wall stable and non-tender to AP and lateral compression. Clavicles stable and non-tender to AP compression Abdominal:     Palpations: Abdomen is soft.     Tenderness: There is no abdominal tenderness.     Comments: Pelvis stable to lateral compression.  Musculoskeletal:     Cervical back: Neck supple.     Comments: No midline tenderness to palpation of the thoracic or lumbar spine. Extremities atraumatic with intact ROM.   Skin:    General: Skin is warm and dry.  Neurological:     Mental Status: He is alert.     Comments: MENTAL STATUS EXAM:    Orientation: Alert and oriented to person, place and time.  Memory: Cooperative, follows commands well.   Language: Speech is clear and language is normal.   CRANIAL NERVES:    CN 2 (Optic): Visual fields intact to confrontation.  CN 3,4,6 (EOM): Pupils equal and reactive to light. Full extraocular eye movement without nystagmus.  CN 5 (Trigeminal): Facial sensation is normal, no weakness of masticatory muscles.  CN 7 (Facial): No facial weakness or asymmetry.  CN 8 (Auditory): Auditory acuity grossly normal.  CN 9,10 (Glossophar): The uvula is midline, the palate elevates symmetrically.  CN 11 (spinal access): Normal sternocleidomastoid and trapezius strength.  CN 12 (Hypoglossal): The tongue is midline. No atrophy or fasciculations.Marland Kitchen   MOTOR:  Muscle Strength: 5/5RUE, 5/5LUE, 5/5RLE, 5/5LLE.   COORDINATION:   Intact finger-to-nose, no tremor.   SENSATION:   Intact to light touch all four extremities.  GAIT: Gait normal without ataxia      ED Results / Procedures / Treatments   Labs (all labs ordered are listed, but only abnormal results are displayed) Labs Reviewed  BASIC METABOLIC PANEL - Abnormal; Notable for the following components:      Result Value   Glucose, Bld 218 (*)    Creatinine, Ser 1.42 (*)    GFR, Estimated 49 (*)    All other components within normal limits  CBC WITH DIFFERENTIAL/PLATELET - Abnormal; Notable for the following components:   Neutro Abs 8.5 (*)    All other components within normal limits  URINALYSIS, ROUTINE W REFLEX MICROSCOPIC - Abnormal; Notable for the following components:   Glucose, UA 100 (*)    Ketones, ur 15 (*)    Protein, ur 30 (*)    All other components within normal limits  URINALYSIS, MICROSCOPIC (REFLEX) - Abnormal; Notable for the following components:   Bacteria, UA RARE (*)    All other components within normal limits  CBG MONITORING, ED - Abnormal; Notable for the following components:   Glucose-Capillary 230 (*)    All other components within normal limits  TROPONIN I (HIGH SENSITIVITY)    EKG EKG  Interpretation  Date/Time:  Wednesday March 03 2022 12:35:07 EST Ventricular Rate:  57 PR Interval:  168 QRS Duration: 95 QT Interval:  426 QTC Calculation: 415 R Axis:   28 Text Interpretation: Sinus bradycardia Borderline T wave abnormalities Confirmed by Regan Lemming ([id number]) on 03/03/2022 12:51:58 PM  Radiology CT HEAD WO CONTRAST  Result Date: 03/03/2022 CLINICAL DATA:  Provided history: Head trauma, minor. Neck trauma. Syncopal episode/fall (hitting back of head). EXAM: CT HEAD WITHOUT CONTRAST CT CERVICAL SPINE WITHOUT CONTRAST TECHNIQUE: Multidetector CT imaging of the head and cervical spine was performed following the standard protocol without intravenous contrast. Multiplanar CT image reconstructions of the cervical spine were also generated.  RADIATION DOSE REDUCTION: This exam was performed according to the departmental dose-optimization program which includes automated exposure control, adjustment of the mA and/or kV according to patient size and/or use of iterative reconstruction technique. COMPARISON:  Cervical spine MRI 09/13/2020.  Head CT 05/25/2019. FINDINGS: CT HEAD FINDINGS Brain: Generalized cerebral atrophy. Mild-to-moderate patchy and ill-defined hypoattenuation within the cerebral white matter, nonspecific but compatible with chronic small ischemic disease. Chronic lacunar infarcts within the bilateral basal ganglia. There is no acute intracranial hemorrhage. No demarcated cortical infarct. No extra-axial fluid collection. No evidence of an intracranial mass. No midline shift. Vascular: No hyperdense vessel. Atherosclerotic calcifications. Skull: No fracture or aggressive osseous lesion. Sinuses/Orbits: No mass or acute finding within the imaged orbits. Trace mucosal thickening within the bilateral ethmoid, right sphenoid and bilateral ethmoid sinuses at the imaged levels. Other: Right parietooccipital scalp laceration (series 3, image 18). CT CERVICAL SPINE FINDINGS  Alignment: Dextrocurvature of the cervical spine. 2 mm C2-C3 grade 1 anterolisthesis. Slight C7-T1 grade 1 anterolisthesis. Skull base and vertebrae: The basion-dental and atlanto-dental intervals are maintained.No evidence of acute fracture to the cervical spine. 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 Soft tissues and spinal canal: No prevertebral fluid or swelling. No visible canal hematoma. Disc levels: Prior C5-C6 ACDF.  Solid fusion across the C5-C6 disc space. Cervical spondylosis with multilevel disc space narrowing, disc bulges/central disc protrusions, uncovertebral hypertrophy and facet arthrosis. Disc space narrowing is greatest at C3-C4 (advanced at this level). Early osseous fusion is questioned across the left aspect of the C2-C3 disc space. No appreciable high-grade spinal canal stenosis. Multilevel bony neural foraminal narrowing. Upper chest: No consolidation within the imaged lung apices. No visible pneumothorax. Other: Subcentimeter nodule within the right thyroid lobe at the imaged levels, not meeting consensus criteria for ultrasound follow-up based on size. No follow-up imaging is recommended. Reference: J Am Coll Radiol. 2015 Feb;12(2): 143-50. IMPRESSION: CT head: 1.  No evidence of an acute intracranial abnormality. 2. Right parietooccipital scalp laceration. 3. 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 CT cervical spine: 1. No evidence of acute fracture to the cervical spine. 2. Dextrocurvature of the cervical spine. 3. Mild grade 1 spondylolisthesis at C2-C3 and C7-T1. 4. Prior C5-C6 ACDF. 5. Cervical spondylosis, as described. 6. 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 Additionally, there is suspected early osseous fusion across the left aspect of the disc space at this level. Electronically Signed   By: Kellie Simmering D.O.   On: 03/03/2022 13:56   CT CERVICAL SPINE WO CONTRAST  Result Date: 03/03/2022 CLINICAL DATA:  Provided history: Head trauma, minor.  Neck trauma. Syncopal episode/fall (hitting back of head). EXAM: CT HEAD WITHOUT CONTRAST CT CERVICAL SPINE WITHOUT CONTRAST TECHNIQUE: Multidetector CT imaging of the head and cervical spine was performed following the standard protocol without intravenous contrast. Multiplanar CT image reconstructions of the cervical spine were also generated. RADIATION DOSE REDUCTION: This exam was performed according to the departmental dose-optimization program which includes automated exposure control, adjustment of the mA and/or kV according to patient size and/or use of iterative reconstruction technique. COMPARISON:  Cervical spine MRI 09/13/2020.  Head CT 05/25/2019. FINDINGS: CT HEAD FINDINGS Brain: Generalized cerebral atrophy. Mild-to-moderate patchy and ill-defined hypoattenuation within the cerebral white matter, nonspecific but compatible with chronic small ischemic disease. Chronic lacunar infarcts within the bilateral basal ganglia. There is no acute intracranial hemorrhage. No demarcated cortical infarct. No extra-axial fluid collection. No evidence of an intracranial mass. No midline shift. Vascular: No  hyperdense vessel. Atherosclerotic calcifications. Skull: No fracture or aggressive osseous lesion. Sinuses/Orbits: No mass or acute finding within the imaged orbits. Trace mucosal thickening within the bilateral ethmoid, right sphenoid and bilateral ethmoid sinuses at the imaged levels. Other: Right parietooccipital scalp laceration (series 3, image 18). CT CERVICAL SPINE FINDINGS Alignment: Dextrocurvature of the cervical spine. 2 mm C2-C3 grade 1 anterolisthesis. Slight C7-T1 grade 1 anterolisthesis. Skull base and vertebrae: The basion-dental and atlanto-dental intervals are maintained.No evidence of acute fracture to the cervical spine. 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 Soft tissues and spinal canal: No prevertebral fluid or swelling. No visible canal hematoma. Disc levels: Prior C5-C6 ACDF.  Solid fusion  across the C5-C6 disc space. Cervical spondylosis with multilevel disc space narrowing, disc bulges/central disc protrusions, uncovertebral hypertrophy and facet arthrosis. Disc space narrowing is greatest at C3-C4 (advanced at this level). Early osseous fusion is questioned across the left aspect of the C2-C3 disc space. No appreciable high-grade spinal canal stenosis. Multilevel bony neural foraminal narrowing. Upper chest: No consolidation within the imaged lung apices. No visible pneumothorax. Other: Subcentimeter nodule within the right thyroid lobe at the imaged levels, not meeting consensus criteria for ultrasound follow-up based on size. No follow-up imaging is recommended. Reference: J Am Coll Radiol. 2015 Feb;12(2): 143-50. IMPRESSION: CT head: 1.  No evidence of an acute intracranial abnormality. 2. Right parietooccipital scalp laceration. 3. 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 CT cervical spine: 1. No evidence of acute fracture to the cervical spine. 2. Dextrocurvature of the cervical spine. 3. Mild grade 1 spondylolisthesis at C2-C3 and C7-T1. 4. Prior C5-C6 ACDF. 5. Cervical spondylosis, as described. 6. 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 Additionally, there is suspected early osseous fusion across the left aspect of the disc space at this level. Electronically Signed   By: Kellie Simmering D.O.   On: 03/03/2022 13:56   DG Chest Port 1 View  Result Date: 03/03/2022 CLINICAL DATA:  Syncope EXAM: PORTABLE CHEST 1 VIEW COMPARISON:  None Available. FINDINGS: The heart size and mediastinal contours are within normal limits. Both lungs are clear. The visualized skeletal structures are unremarkable. IMPRESSION: No acute abnormality of the lungs in AP portable projection. Electronically Signed   By: Delanna Ahmadi M.D.   On: 03/03/2022 13:09    Procedures .Marland KitchenLaceration Repair  Date/Time: 03/03/2022 3:18 PM  Performed by: Regan Lemming, MD Authorized by:  Regan Lemming, MD   Consent:    Consent obtained:  Verbal   Consent given by:  Patient   Risks discussed:  Need for additional repair, infection and pain   Alternatives discussed:  No treatment Universal protocol:    Patient identity confirmed:  Verbally with patient Anesthesia:    Anesthesia method:  Topical application   Topical anesthetic:  LET Laceration details:    Location:  Scalp   Scalp location:  Occipital   Length (cm):  4 Pre-procedure details:    Preparation:  Imaging obtained to evaluate for foreign bodies Exploration:    Hemostasis achieved with:  LET Treatment:    Area cleansed with:  Saline   Amount of cleaning:  Standard Skin repair:    Repair method:  Staples   Number of staples:  4 Approximation:    Approximation:  Close Repair type:    Repair type:  Simple Post-procedure details:    Dressing:  Open (no dressing)   Procedure completion:  Tolerated     Medications Ordered in ED Medications  lidocaine-EPINEPHrine-tetracaine (  LET) topical gel (3 mLs Topical Given 03/03/22 1430)  Tdap (BOOSTRIX) injection 0.5 mL (0.5 mLs Intramuscular Given 03/03/22 1423)    ED Course/ Medical Decision Making/ A&P                             Medical Decision Making Amount and/or Complexity of Data Reviewed Labs: ordered. Radiology: ordered. ECG/medicine tests: ordered.  Risk Prescription drug management.    82 year old male with medical history significant for hypertension on amlodipine, hydralazine and Coreg, prior CVA with residual left arm numbness who presents to the emergency department after syncopal episode.  The patient is not on anticoagulation.  He states that he was ambulating to the bathroom when he endorsed a feeling of lightheadedness, diaphoresis and generalized weakness.  No focal numbness or weakness, no facial droop, no dysarthria or dysphagia.  He denied any chest pain or shortness of breath or palpitations at the time.  While in the bathroom he  syncopized, striking the back of his head on a glass fixture.  He sustained a 4 cm laceration.  He subsequently came to and has returned to his baseline ental status.  He was ambulatory on scene when EMS arrived, bleeding controlled, subsequently transported to the emergency department as a nonlevel trauma GCS 15, ABC intact.  He denies any other injuries or complaints on arrival beyond a head laceration.  He is not up-to-date on his tetanus.  Currently, Esam Goeckner [id number] is awake, alert, and hemodynamically stable.  I am most concerned for vasovagal syncope with a combination of his antihypertensive regimen.  The patient arrived to the emergency department afebrile, mildly bradycardic heart rate 58, RR 16, BP 148/63, saturating 98% on room air.  He appears euvolemic on exam.  He denies any chest pain or shortness of breath.. I do not think that this is due to ACS, PE, dysrhythmia, electrolyte abnormality, heart valve abnormality such as aortic stenosis, HOCM, pneumothorax, aortic dissection, ruptured AAA, anemia, hypoglycemia, seizure, stroke, hypovolemia, or neurocardiogenic syncope.   On exam, he has intact distal pulses in all extremities, normal capillary refill, a regular heart rate, and normal breath sounds. There is no carotid bruit, unusual abdominal mass, or heart murmur.  As per above, there are no focal neurologic deficits on exam and the patient can ambulate without difficultly.  As per above, the ECG reveals no evidence of acute ischemia, dysrhythmia, or syncope-associated finding. The CXR was both interpreted by radiology and by myself. There is no evidence of cardiomegaly, pneumonia, pneumothorax, mediastinal widening, acute volume overload, fracture, or other acute, emergent intrathoracic pathology.    CT Head and cervical spine were and given the patient's head trauma and age: IMPRESSION:  CT head:    1.  No evidence of an acute intracranial abnormality.  2. Right parietooccipital  scalp laceration.  3. 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    CT cervical spine:    1. No evidence of acute fracture to the cervical spine.  2. Dextrocurvature of the cervical spine.  3. Mild grade 1 spondylolisthesis at C2-C3 and C7-T1.  4. Prior C5-C6 ACDF.  5. Cervical spondylosis, as described.  6. 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 Additionally, there is  suspected early osseous fusion across the left aspect of the disc  space at this level.    Labs reveal no acute abnormalities. Specifically, his initial troponin is normal.  His urinalysis was unremarkable.  His initial CBG was 230, his CBC was without a leukocytosis or anemia, his BMP revealed an elevated serum creatinine but downtrending from previous measurements and at baseline.  The patient's tetanus was updated in the emergency department.  His workup is negative for acute traumatic injury other than his scalp laceration which was then subsequently cleaned and repaired with 4 staples as per the procedure note above.  Considered admission for observation in the setting of the patient's syncopal episode.  I discussed the care of the patient with on-call cardiology, Dr. Claiborne Billings.  My recommendation is that the patient hold his Coreg and follow-up outpatient with cardiology and Dr. Claiborne Billings was in agreement with this plan.  Will have the patient follow-up urgently with Dr. Alvester Chou for further management of his antihypertensives and for outpatient follow-up.  The patient would prefer to be discharged rather than be admitted to the hospital at this time and I believe that this is safe and reasonable.  He has had no further episodes in the emergency department, is hemodynamically stable, at his baseline mental status with a normal neurologic exam and an overall reassuring workup.  The patient with wound care instructions and advised him to hold his Coreg until he can follow-up outpatient with cardiology.   Plan for staple removal and wound assessment in 10 days, return precautions provided in the event of infection.  Final Clinical Impression(s) / ED Diagnoses Final diagnoses:  Syncope and collapse  Laceration of scalp, initial encounter  Vasovagal syncope  Bradycardia, drug induced    Rx / DC Orders ED Discharge Orders     None         Regan Lemming, MD 03/03/22 1520

## 2022-03-03 NOTE — Discharge Instructions (Addendum)
Please discontinue your home Coreg as this could have been contributory to your syncopal episode today.  We considered admission for observation versus discharge and close cardiology follow-up and you preferred to be discharged with close cardiology follow-up.  You sustained a head laceration which was repaired with staples today.  Recommend that the staples be taken out in 10 days time.  Watch for signs of developing infection which include redness, fever or chills, purulence coming from the wound, worsening pain and swelling.  Your tetanus was updated today.

## 2022-03-03 NOTE — ED Notes (Signed)
Patient denied light-headedness or dizziness while performing orthostatics. RN is aware.

## 2022-03-03 NOTE — ED Notes (Signed)
X-ray at bedside

## 2022-03-03 NOTE — ED Notes (Signed)
Pt verbalized understanding of discharge instructions. Opportunity for questions provided.  

## 2022-03-10 ENCOUNTER — Encounter (HOSPITAL_BASED_OUTPATIENT_CLINIC_OR_DEPARTMENT_OTHER): Payer: Self-pay | Admitting: Family Medicine

## 2022-03-10 ENCOUNTER — Ambulatory Visit (INDEPENDENT_AMBULATORY_CARE_PROVIDER_SITE_OTHER): Payer: Medicare Other | Admitting: Family Medicine

## 2022-03-10 VITALS — BP 151/66 | HR 58 | Temp 98.1°F | Ht 67.0 in | Wt 145.7 lb

## 2022-03-10 DIAGNOSIS — E782 Mixed hyperlipidemia: Secondary | ICD-10-CM | POA: Diagnosis not present

## 2022-03-10 DIAGNOSIS — I10 Essential (primary) hypertension: Secondary | ICD-10-CM

## 2022-03-10 DIAGNOSIS — R55 Syncope and collapse: Secondary | ICD-10-CM

## 2022-03-10 DIAGNOSIS — R7303 Prediabetes: Secondary | ICD-10-CM | POA: Diagnosis not present

## 2022-03-10 DIAGNOSIS — S0101XA Laceration without foreign body of scalp, initial encounter: Secondary | ICD-10-CM

## 2022-03-10 NOTE — Assessment & Plan Note (Signed)
No recent lipid panel on file, we will check fasting lipid panel shortly before next appointment.  Can continue with current statin therapy

## 2022-03-10 NOTE — Assessment & Plan Note (Signed)
Appears to be healing well clinically.  4 staples in place, given current evidence of healing, these were removed in office today without issue.  Discussed general wound precautions moving forward, recommend returning to the office should any issues arise

## 2022-03-10 NOTE — Assessment & Plan Note (Signed)
Has been doing well since emergency department visit last week.  Carvedilol was held at that time, has noted improvement in pulse rate, no further episodes of syncope, no falls.  Can continue with current changes to medication regimen, recommend close follow-up with cardiology as scheduled for next week

## 2022-03-10 NOTE — Assessment & Plan Note (Signed)
We will plan to check updated hemoglobin A1c shortly before next appointment

## 2022-03-10 NOTE — Progress Notes (Unsigned)
New Patient Office Visit  Subjective    Patient ID: Jonathan Savage, male    DOB: 1940-01-22  Age: 82 y.o. MRN: [id number]  CC:  Chief Complaint  Patient presents with   Establish Care    HPI Theordore Volbrecht [facility] presents to establish care Last PCP - reports it has been many years  HTN: Current medications include amlodipine, irbesartan, hydralazine.  He does follow with cardiology.  Most recently was taking carvedilol as well, however at recent emergency department visit, this was held due to episode of syncope and experiencing low heart rate.  He does check his blood pressure intermittently at home, does have blood pressure log with him today.  Generally readings have been in the [id number] systolic, [id number] diastolic.  History of stroke: Reports that this was about 7 years ago.  Does have some residual left arm tingling, however generally reports that he recovered well from the stroke.  Prediabetes: Noted in the past, most recent hemoglobin A1c was about 1.5 years ago and was found to be 5.7% at that time.  No new issues of polyuria or polydipsia.  Primarily managing with lifestyle modifications  Recent syncope/scalp laceration: Was seen in the emergency department recently for recent syncopal episode and scalp laceration.  It was felt that syncopal episode related to vasovagal etiology.  He was recommended to hold his carvedilol due to low pulse and intermittent lightheadedness/dizziness.  He has been doing well since emergency department visit.  Feels that wound has been healing well, denies any acute concerns related to this.  Wonders about having staples removed today.  [place], has lived here for about 4 years. 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  Outpatient Encounter Medications as of 03/10/2022  Medication Sig   amLODipine (NORVASC) 5 MG tablet Take 1 tablet (5 mg total) by mouth in the morning and at  bedtime.   aspirin EC 81 MG tablet Take 81 mg by mouth daily.   hydrALAZINE (APRESOLINE) 25 MG tablet TAKE 1 TABLET (25 MG) BY MOUTH IN THE MORNING AND AT BEDTIME   irbesartan (AVAPRO) 150 MG tablet Take 1&1/2 tablets daily   Omega 3-6-9 Fatty Acids (OMEGA 3-6-9 COMPLEX PO) Take by mouth.   rosuvastatin (CRESTOR) 10 MG tablet TAKE 1 TABLET BY MOUTH EVERY DAY   vitamin B-12 (CYANOCOBALAMIN) 1000 MCG tablet Take 1,000 mcg by mouth daily.   vitamin C (ASCORBIC ACID) 500 MG tablet Take 500 mg by mouth daily.   No facility-administered encounter medications on file as of 03/10/2022.    Past Medical History:  Diagnosis Date   Carotid artery occlusion    Complication of anesthesia    pt states following last surgery he was awake for over 24 hours following surgery   Hypertension    Stroke [facility])     Past Surgical History:  Procedure Laterality Date   ENDARTERECTOMY Left 08/22/2019   Procedure: LEFT CAROTID ENDARTERECTOMY WITH BOVIE PATCH ANGIOPLASTY;  Surgeon: Serafina Mitchell, MD;  Location: [facility] OR;  Service: Vascular;  Laterality: Left;   LAPAROSCOPIC NEPHRECTOMY Right    NECK SURGERY     PROSTATE SURGERY      Family History  Problem Relation Age of Onset   Dementia Mother    Heart failure Father        Deteriration of the heart   Stroke Brother     Social History   Socioeconomic History   Marital  status: Widowed    Spouse name: Not on file   Number of children: 3   Years of education: Not on file   Highest education level: Not on file  Occupational History   Not on file  Tobacco Use   Smoking status: Former   Smokeless tobacco: Never  Vaping Use   Vaping Use: Never used  Substance and Sexual Activity   Alcohol use: Never   Drug use: Never   Sexual activity: Not on file  Other Topics Concern   Not on file  Social History Narrative   Right Handed    Lives in a two story home    Father to 3 girls    Social Determinants of Health   Financial Resource Strain: Not  on file  Food Insecurity: Not on file  Transportation Needs: Not on file  Physical Activity: Not on file  Stress: Not on file  Social Connections: Not on file  Intimate Partner Violence: Not on file    Objective    BP (!) 151/66   Pulse (!) 58   Temp 98.1 F (36.7 C) (Oral)   Ht '5\' 7"'$  (1.702 m)   Wt 145 lb 11.2 oz (66.1 kg)   SpO2 99%   BMI 22.82 kg/m   Physical Exam  82 year old male in no acute distress Cardiovascular exam with regular rate and rhythm, no murmur appreciated Lungs clear to auscultation bilaterally Over occipital region of the scalp, 4 staples in place with well-healing laceration present.  No discharge, bleeding or bruising present.  No surrounding erythema.  No significant tenderness to palpation.  Assessment & Plan:   Problem List Items Addressed This Visit       Cardiovascular and Mediastinum   Essential hypertension - Primary    Blood pressure borderline in office today.  Home readings appear to be better controlled on blood pressure log.  Can continue with current medication regimen.  Can continue holding carvedilol as per recent emergency department visit.  Recommend close follow-up with cardiology which is scheduled for next week      Relevant Orders   Hemoglobin A1c   Comprehensive metabolic panel   CBC with Differential/Platelet   Lipid panel     Other   Hyperlipidemia    No recent lipid panel on file, we will check fasting lipid panel shortly before next appointment.  Can continue with current statin therapy      Relevant Orders   Lipid panel   Syncope and collapse    Has been doing well since emergency department visit last week.  Carvedilol was held at that time, has noted improvement in pulse rate, no further episodes of syncope, no falls.  Can continue with current changes to medication regimen, recommend close follow-up with cardiology as scheduled for next week      Prediabetes    We will plan to check updated hemoglobin A1c  shortly before next appointment      Relevant Orders   Hemoglobin A1c   Scalp laceration    Appears to be healing well clinically.  4 staples in place, given current evidence of healing, these were removed in office today without issue.  Discussed general wound precautions moving forward, recommend returning to the office should any issues arise       Return in about 3 months (around 06/10/2022).   Aahana Elza J [place], MD

## 2022-03-10 NOTE — Assessment & Plan Note (Signed)
Blood pressure borderline in office today.  Home readings appear to be better controlled on blood pressure log.  Can continue with current medication regimen.  Can continue holding carvedilol as per recent emergency department visit.  Recommend close follow-up with cardiology which is scheduled for next week

## 2022-03-12 ENCOUNTER — Ambulatory Visit (HOSPITAL_BASED_OUTPATIENT_CLINIC_OR_DEPARTMENT_OTHER): Payer: Medicare Other | Admitting: Family

## 2022-03-15 ENCOUNTER — Other Ambulatory Visit: Payer: Self-pay | Admitting: Nurse Practitioner

## 2022-03-15 ENCOUNTER — Other Ambulatory Visit (INDEPENDENT_AMBULATORY_CARE_PROVIDER_SITE_OTHER): Payer: Medicare Other

## 2022-03-15 ENCOUNTER — Ambulatory Visit: Payer: Medicare Other | Attending: Family | Admitting: Nurse Practitioner

## 2022-03-15 ENCOUNTER — Other Ambulatory Visit: Payer: Self-pay | Admitting: Cardiovascular Disease

## 2022-03-15 ENCOUNTER — Encounter: Payer: Self-pay | Admitting: Nurse Practitioner

## 2022-03-15 VITALS — BP 110/64 | HR 91 | Ht 67.0 in | Wt 144.0 lb

## 2022-03-15 DIAGNOSIS — Z8673 Personal history of transient ischemic attack (TIA), and cerebral infarction without residual deficits: Secondary | ICD-10-CM | POA: Insufficient documentation

## 2022-03-15 DIAGNOSIS — I6523 Occlusion and stenosis of bilateral carotid arteries: Secondary | ICD-10-CM | POA: Diagnosis not present

## 2022-03-15 DIAGNOSIS — I10 Essential (primary) hypertension: Secondary | ICD-10-CM

## 2022-03-15 DIAGNOSIS — Z87898 Personal history of other specified conditions: Secondary | ICD-10-CM | POA: Diagnosis not present

## 2022-03-15 DIAGNOSIS — E785 Hyperlipidemia, unspecified: Secondary | ICD-10-CM | POA: Insufficient documentation

## 2022-03-15 DIAGNOSIS — R55 Syncope and collapse: Secondary | ICD-10-CM

## 2022-03-15 NOTE — Progress Notes (Unsigned)
Enrolled for [employer] to mail a ZIO AT Live Telemetry monitor to patients address on file.   Dr. Berry to read. 

## 2022-03-15 NOTE — Patient Instructions (Addendum)
Medication Instructions:  Your physician recommends that you continue on your current medications as directed. Please refer to the Current Medication list given to you today.   *If you need a refill on your cardiac medications before your next appointment, please call your pharmacy*   Lab Work: NONE ordered at this time of appointment   If you have labs (blood work) drawn today and your tests are completely normal, you will receive your results only by: [place] (if you have MyChart) OR A paper copy in the mail If you have any lab test that is abnormal or we need to change your treatment, we will call you to review the results.   Testing/Procedures: Your physician has requested that you have an echocardiogram. Echocardiography is a painless test that uses sound waves to create images of your heart. It provides your doctor with information about the size and shape of your heart and how well your heart's chambers and valves are working. This procedure takes approximately one hour. There are no restrictions for this procedure. Please do NOT wear cologne, perfume, aftershave, or lotions (deodorant is allowed). Please arrive 15 minutes prior to your appointment time.  ZIO AT Long term monitor-Live Telemetry  Your physician has requested you wear a ZIO patch monitor for 14 days.  This is a single patch monitor. [employer] supplies one patch monitor per enrollment. Additional  stickers are not available.  Please do not apply patch if you will be having a Nuclear Stress Test, Echocardiogram, Cardiac CT, MRI,  or Chest Xray during the period you would be wearing the monitor. The patch cannot be worn during  these tests. You cannot remove and re-apply the ZIO AT patch monitor.  Your ZIO patch monitor will be mailed 3 day USPS to your address on file. It may take 3-5 days to  receive your monitor after you have been enrolled.  Once you have received your monitor, please review the enclosed  instructions. Your monitor has  already been registered assigning a specific monitor serial # to you.   Billing and Patient Assistance Program information  Theodore Demark has been supplied with any insurance information on record for billing. [employer] offers a sliding scale Patient Assistance Program for patients without insurance, or whose  insurance does not completely cover the cost of the ZIO patch monitor. You must apply for the  Patient Assistance Program to qualify for the discounted rate. To apply, call [employer] at [phone],  select option 4, select option 2 , ask to apply for the Patient Assistance Program, (you can request an  interpreter if needed). [employer] will ask your household income and how many people are in your  household. [employer] will quote your out-of-pocket cost based on this information. They will also be able  to set up a 12 month interest free payment plan if needed.  Applying the monitor   Shave hair from upper left chest.  Hold the abrader disc by orange tab. Rub the abrader in 40 strokes over left upper chest as indicated in  your monitor instructions.  Clean area with 4 enclosed alcohol pads. Use all pads to ensure the area is cleaned thoroughly. Let  dry.  Apply patch as indicated in monitor instructions. Patch will be placed under collarbone on left side of  chest with arrow pointing upward.  Rub patch adhesive wings for 2 minutes. Remove the white label marked "1". Remove the white label  marked "2". Rub patch adhesive wings for 2 additional minutes.  While looking in a mirror, press and release button in center of patch. A small green light will flash 3-4  times. This will be your only indicator that the monitor has been turned on.  Do not shower for the first 24 hours. You may shower after the first 24 hours.  Press the button if you feel a symptom. You will hear a small click. Record Date, Time and Symptom in  the Patient Log.   Starting the Gateway  In  your kit there is a Hydrographic surveyor box the size of a cellphone. This is Airline pilot. It transmits all your  recorded data to [facility]. This box must always stay within 10 feet of you. Open the box and push the *  button. There will be a light that blinks orange and then green a few times. When the light stops  blinking, the Gateway is connected to the ZIO patch. Call [employer] at [phone] to confirm your monitor is transmitting.  Returning your monitor  Remove your patch and place it inside the [place]. In the lower half of the Gateway there is a white  bag with prepaid postage on it. Place Gateway in bag and seal. Mail package back to [place] as soon as  possible. Your physician should have your final report approximately 7 days after you have mailed back  your monitor. Call [place] at [phone] if you have questions regarding your ZIO AT  patch monitor. Call them immediately if you see an orange light blinking on your monitor.  If your monitor falls off in less than 4 days, contact our Monitor department at [phone]. If your  monitor becomes loose or falls off after 4 days call [employer] at [phone] for suggestions on  securing your monitor    Follow-Up: At [facility], you and your health needs are our priority.  As part of our continuing mission to provide you with exceptional heart care, we have created designated Provider Care Teams.  These Care Teams include your primary Cardiologist (physician) and Advanced Practice Providers (APPs -  Physician Assistants and Nurse Practitioners) who all work together to provide you with the care you need, when you need it.  We recommend signing up for the patient portal called "MyChart".  Sign up information is provided on this After Visit Summary.  MyChart is used to connect with patients for Virtual Visits (Telemedicine).  Patients are able to view lab/test results, encounter notes, upcoming  appointments, etc.  Non-urgent messages can be sent to your provider as well.   To learn more about what you can do with MyChart, go to [email or web address].    Your next appointment:   6-7 month(s)  Provider:    Avelina Laine  Other Instructions

## 2022-03-15 NOTE — Progress Notes (Signed)
Office Visit    Patient Name: Jada Leza [facility] Date of Encounter: 03/15/2022  Primary Care Provider:  [place], Blondell Reveal, MD Primary Cardiologist:  Quay Burow, MD  Chief Complaint    82 year old male with a history of syncope, carotid artery stenosis s/p L CEA in 2021, hypertension, hyperlipidemia, and stroke who presents for follow-up related to syncope and hypertension.  Past Medical History    Past Medical History:  Diagnosis Date   Carotid artery occlusion    Complication of anesthesia    pt states following last surgery he was awake for over 24 hours following surgery   Hypertension    Stroke [facility])    Past Surgical History:  Procedure Laterality Date   ENDARTERECTOMY Left 08/22/2019   Procedure: LEFT CAROTID ENDARTERECTOMY WITH BOVIE PATCH ANGIOPLASTY;  Surgeon: Serafina Mitchell, MD;  Location: [facility] OR;  Service: Vascular;  Laterality: Left;   LAPAROSCOPIC NEPHRECTOMY Right    NECK SURGERY     PROSTATE SURGERY      Allergies  Allergies  Allergen Reactions   Hydrochlorothiazide     Other reaction(s): Other (See Comments) Leg pain      Labs/Other Studies Reviewed    The following studies were reviewed today: Lexiscan myoview 06/2019: Horizontal ST segment depression of 1 mm was noted during stress in the II, III and aVF leads. Nuclear stress EF: 55%. The left ventricular ejection fraction is normal (55-65%). Defect 1: There is a medium defect of mild severity present in the mid anteroseptal, apical septal and apex location. Defect 2: There is a medium defect of mild severity present in the basal inferior, mid inferior and apical inferior location. Findings consistent with prior myocardial infarction versus artifact. Suspect artifact given normal wall motion in these areas This is a low risk study.   Fixed perfusion defects in anteroseptum, apex, and inferior walls, with normal wall motion in these regions, suggestive of artifact.    Carotid Arterial  Duplex Study 08/2021:   Summary: Right Carotid: Velocities in the right ICA are consistent with a 40-59% stenosis.  Left Carotid: Velocities in the left ICA are consistent with a 1-39% stenosis.  Vertebrals: Bilateral vertebral arteries demonstrate antegrade flow.  *See table(s) above for measurements and observations.   Electronically signed by Harold Barban MD on 08/17/2021 at 8:20:04 PM.    Recent Labs: 03/03/2022: BUN 21; Creatinine, Ser 1.42; Hemoglobin 14.2; Platelets 248; Potassium 4.6; Sodium 135  Recent Lipid Panel    Component Value Date/Time   CHOL 163 12/19/2019 0938   TRIG 78 12/19/2019 0938   HDL 56 12/19/2019 0938   CHOLHDL 2.9 12/19/2019 0938   [place] 92 12/19/2019 0938    History of Present Illness    82 year old male with the above past medical history including syncope, carotid artery stenosis s/p L CEA in 2021, hypertension, hyperlipidemia, and stroke.  He has a history of prior stroke with residual left upper and lower extremity weakness.  Carotid Dopplers in 06/2019 showed high-grade LICA stenosis.  He was referred to vascular surgery and underwent successful left CEA in 08/2019.  Preoperative Myoview scan.  Most recent carotid Dopplers in 08/2021 showed 40-59% R ICA stenosis, 1-39% L ICA stenosis. He last saw Dr. Gwenlyn Found on 01/08/2021 noted elevated blood pressure.  He was referred to hypertension clinic Pharm.D.  He was last seen in the office on 12/09/2021 by hypertension clinic Pharm.D.  He was advised to take amlodipine 5 mg twice daily instead of 10 mg at bedtime.  He was seen in the ED on 03/03/2022 in the setting of syncopal episode that resulted in a fall.  He sustained a 4 cm laceration to his head.  EKG was stable, chest x-ray was unremarkable.  CT of the head/C-spine was negative for acute abnormality. His case was discussed with on-call cardiologist.  It was felt that his symptoms were most consistent with vasovagal syncope.  Patient was advised to hold his  carvedilol and follow-up with cardiology as an outpatient.  He presents today for follow-up. Since his last visit and since his recent ED visit he has been stable from a cardiac standpoint.  He denies any further presyncope, syncope, denies dizziness, symptoms concerning for angina. He has noted a slight increase in his resting heart rate since discontinuing carvedilol.  Overall, he reports feeling well.  Home Medications    Current Outpatient Medications  Medication Sig Dispense Refill   amLODipine (NORVASC) 5 MG tablet Take 1 tablet (5 mg total) by mouth in the morning and at bedtime. 180 tablet 3   aspirin EC 81 MG tablet Take 81 mg by mouth daily.     hydrALAZINE (APRESOLINE) 25 MG tablet TAKE 1 TABLET (25 MG) BY MOUTH IN THE MORNING AND AT BEDTIME 180 tablet 3   irbesartan (AVAPRO) 150 MG tablet Take 1&1/2 tablets daily     Omega 3-6-9 Fatty Acids (OMEGA 3-6-9 COMPLEX PO) Take by mouth.     rosuvastatin (CRESTOR) 10 MG tablet TAKE 1 TABLET BY MOUTH EVERY DAY 90 tablet 0   vitamin B-12 (CYANOCOBALAMIN) 1000 MCG tablet Take 1,000 mcg by mouth daily.     vitamin C (ASCORBIC ACID) 500 MG tablet Take 500 mg by mouth daily.     No current facility-administered medications for this visit.     Review of Systems    He denies chest pain, palpitations, dyspnea, pnd, orthopnea, n, v, dizziness, syncope, edema, weight gain, or early satiety. All other systems reviewed and are otherwise negative except as noted above.   Physical Exam    VS:  BP 110/64   Pulse 91   Ht '5\' 7"'$  (1.702 m)   Wt 144 lb (65.3 kg)   SpO2 97%   BMI 22.55 kg/m  GEN: Well nourished, well developed, in no acute distress. HEENT: normal. Neck: Supple, no JVD, carotid bruits, or masses. Cardiac: RRR, no murmurs, rubs, or gallops. No clubbing, cyanosis, edema.  Radials/DP/PT 2+ and equal bilaterally.  Respiratory:  Respirations regular and unlabored, clear to auscultation bilaterally. GI: Soft, nontender, nondistended,  BS + x 4. MS: no deformity or atrophy. Skin: warm and dry, no rash. Neuro:  Strength and sensation are intact. Psych: Normal affect.  Accessory Clinical Findings    ECG personally reviewed by me today - NSR, 91 bpm - no acute changes.   Lab Results  Component Value Date   WBC 10.4 03/03/2022   HGB 14.2 03/03/2022   HCT 42.4 03/03/2022   MCV 89.6 03/03/2022   PLT 248 03/03/2022   Lab Results  Component Value Date   CREATININE 1.42 (H) 03/03/2022   BUN 21 03/03/2022   NA 135 03/03/2022   K 4.6 03/03/2022   CL 102 03/03/2022   CO2 25 03/03/2022   Lab Results  Component Value Date   ALT 16 12/19/2019   AST 20 12/19/2019   ALKPHOS 85 12/19/2019   BILITOT 0.4 12/19/2019   Lab Results  Component Value Date   CHOL 163 12/19/2019   HDL 56 12/19/2019  LDLCALC 92 12/19/2019   TRIG 78 12/19/2019   CHOLHDL 2.9 12/19/2019    No results found for: "HGBA1C"  Assessment & Plan    1. Syncope: Thought to be vasovagal.  He was also borderline hypotensive, bradycardic at the time.  Carvedilol was discontinued.  Has noticed a slight increase in his resting heart rate since discontinuing carvedilol. Denies any recurrent syncope, presyncope.  Discussed driving restrictions, ED precautions.  Will check 14-day live ZIO (patient declined 30-day event monitor), will update echo.    2. Hypertension: BP stable. Continue current antihypertensive regimen.   3. Carotid artery stenosis: S/p L CEA in 2021. Most recent carotid Dopplers in 08/2021 showed 40-59% R ICA stenosis, 1-39% L ICA stenosis.  Follows with vascular surgery.  4. Hyperlipidemia: Pending repeat lipids per PCP.  Continue Crestor.  5. History of CVA: No recurrence.  Recent CT of the head in the setting of syncope was negative for acute abnormality.  Continue aspirin, Crestor.  6. Disposition: Follow-up in 6 months with Dr. Gwenlyn Found (patient declined sooner appointment).     Lenna Sciara, NP 03/15/2022, 8:59 PM

## 2022-03-16 ENCOUNTER — Telehealth: Payer: Self-pay | Admitting: Cardiovascular Disease

## 2022-03-16 NOTE — Telephone Encounter (Signed)
Patient is asking that Monge give him a call. Calling in regards to getting the heart monitor. Please advise

## 2022-03-16 NOTE — Telephone Encounter (Signed)
Called pt. He states he does not think he will be able to wear the monitor. "I sleep on my left side and it goes on my left side. I don't  think I can do it. I'm not good at keeping my phone with me. So how am I going to take that with me? I'm very active and I exercise so it may not be within 10 feet of me at all times. Raquel Sarna can call me if she wants to. She was a really nice person."

## 2022-03-18 NOTE — Telephone Encounter (Signed)
Patient is calling stating that he may try the monitor after all, since it has arrived today. Requesting call back to discuss.

## 2022-03-18 NOTE — Telephone Encounter (Signed)
Spoke with pt. Pt did receive the monitor and still isn't sure if he is going to wear it. Pt is going to discuss things with his daughter and if he decides not to wear it, he will mail it back to the company. Pt was advised to contact the number on the instructions received for the monitor if he isn't going to wear it to get further instructions from them.

## 2022-03-19 DIAGNOSIS — R55 Syncope and collapse: Secondary | ICD-10-CM

## 2022-03-19 DIAGNOSIS — I10 Essential (primary) hypertension: Secondary | ICD-10-CM | POA: Diagnosis not present

## 2022-04-06 ENCOUNTER — Ambulatory Visit (INDEPENDENT_AMBULATORY_CARE_PROVIDER_SITE_OTHER): Payer: Medicare Other

## 2022-04-06 ENCOUNTER — Encounter (HOSPITAL_BASED_OUTPATIENT_CLINIC_OR_DEPARTMENT_OTHER): Payer: Self-pay

## 2022-04-06 VITALS — Ht 67.0 in | Wt 144.0 lb

## 2022-04-06 DIAGNOSIS — Z0000 Encounter for general adult medical examination without abnormal findings: Secondary | ICD-10-CM

## 2022-04-06 NOTE — Patient Instructions (Addendum)
Jonathan Savage , Thank you for taking time to come for your Medicare Wellness Visit. I appreciate your ongoing commitment to your health goals. Please review the following plan we discussed and let me know if I can assist you in the future.   These are the goals we discussed:  Goals       No currnt goals (pt-stated)        This is a list of the screening recommended for you and due dates:  Health Maintenance  Topic Date Due   COVID-19 Vaccine (3 - 2023-24 season) 04/22/2022*   Zoster (Shingles) Vaccine (1 of 2) 07/06/2022*   Pneumonia Vaccine (1 of 2 - PCV) 04/06/2023*   Flu Shot  08/05/2022   Medicare Annual Wellness Visit  04/06/2023   DTaP/Tdap/Td vaccine (2 - Td or Tdap) 03/03/2032   HPV Vaccine  Aged Out  *Topic was postponed. The date shown is not the original due date.    Advanced directives: Please bring a copy of your health care power of attorney and living will to the office to be added to your chart at your convenience.   Conditions/risks identified: None  Next appointment: Follow up in one year for your annual wellness visit.   Preventive Care 83 Years and Older, Male  Preventive care refers to lifestyle choices and visits with your health care provider that can promote health and wellness. What does preventive care include? A yearly physical exam. This is also called an annual well check. Dental exams once or twice a year. Routine eye exams. Ask your health care provider how often you should have your eyes checked. Personal lifestyle choices, including: Daily care of your teeth and gums. Regular physical activity. Eating a healthy diet. Avoiding tobacco and drug use. Limiting alcohol use. Practicing safe sex. Taking low doses of aspirin every day. Taking vitamin and mineral supplements as recommended by your health care provider. What happens during an annual well check? The services and screenings done by your health care provider during your annual well check  will depend on your age, overall health, lifestyle risk factors, and family history of disease. Counseling  Your health care provider may ask you questions about your: Alcohol use. Tobacco use. Drug use. Emotional well-being. Home and relationship well-being. Sexual activity. Eating habits. History of falls. Memory and ability to understand (cognition). Work and work Statistician. Screening  You may have the following tests or measurements: Height, weight, and BMI. Blood pressure. Lipid and cholesterol levels. These may be checked every 5 years, or more frequently if you are over 43 years old. Skin check. Lung cancer screening. You may have this screening every year starting at age 17 if you have a 30-pack-year history of smoking and currently smoke or have quit within the past 15 years. Fecal occult blood test (FOBT) of the stool. You may have this test every year starting at age 62. Flexible sigmoidoscopy or colonoscopy. You may have a sigmoidoscopy every 5 years or a colonoscopy every 10 years starting at age 93. Prostate cancer screening. Recommendations will vary depending on your family history and other risks. Hepatitis C blood test. Hepatitis B blood test. Sexually transmitted disease (STD) testing. Diabetes screening. This is done by checking your blood sugar (glucose) after you have not eaten for a while (fasting). You may have this done every 1-3 years. Abdominal aortic aneurysm (AAA) screening. You may need this if you are a current or former smoker. Osteoporosis. You may be screened starting at age  70 if you are at high risk. Talk with your health care provider about your test results, treatment options, and if necessary, the need for more tests. Vaccines  Your health care provider may recommend certain vaccines, such as: Influenza vaccine. This is recommended every year. Tetanus, diphtheria, and acellular pertussis (Tdap, Td) vaccine. You may need a Td booster every 10  years. Zoster vaccine. You may need this after age 65. Pneumococcal 13-valent conjugate (PCV13) vaccine. One dose is recommended after age 47. Pneumococcal polysaccharide (PPSV23) vaccine. One dose is recommended after age 50. Talk to your health care provider about which screenings and vaccines you need and how often you need them. This information is not intended to replace advice given to you by your health care provider. Make sure you discuss any questions you have with your health care provider. Document Released: 01/17/2015 Document Revised: 09/10/2015 Document Reviewed: 10/22/2014 Elsevier Interactive Patient Education  2017 [place] Prevention in the Home Falls can cause injuries. They can happen to people of all ages. There are many things you can do to make your home safe and to help prevent falls. What can I do on the outside of my home? Regularly fix the edges of walkways and driveways and fix any cracks. Remove anything that might make you trip as you walk through a door, such as a raised step or threshold. Trim any bushes or trees on the path to your home. Use bright outdoor lighting. Clear any walking paths of anything that might make someone trip, such as rocks or tools. Regularly check to see if handrails are loose or broken. Make sure that both sides of any steps have handrails. Any raised decks and porches should have guardrails on the edges. Have any leaves, snow, or ice cleared regularly. Use sand or salt on walking paths during winter. Clean up any spills in your garage right away. This includes oil or grease spills. What can I do in the bathroom? Use night lights. Install grab bars by the toilet and in the tub and shower. Do not use towel bars as grab bars. Use non-skid mats or decals in the tub or shower. If you need to sit down in the shower, use a plastic, non-slip stool. Keep the floor dry. Clean up any water that spills on the floor as soon as it  happens. Remove soap buildup in the tub or shower regularly. Attach bath mats securely with double-sided non-slip rug tape. Do not have throw rugs and other things on the floor that can make you trip. What can I do in the bedroom? Use night lights. Make sure that you have a light by your bed that is easy to reach. Do not use any sheets or blankets that are too big for your bed. They should not hang down onto the floor. Have a firm chair that has side arms. You can use this for support while you get dressed. Do not have throw rugs and other things on the floor that can make you trip. What can I do in the kitchen? Clean up any spills right away. Avoid walking on wet floors. Keep items that you use a lot in easy-to-reach places. If you need to reach something above you, use a strong step stool that has a grab bar. Keep electrical cords out of the way. Do not use floor polish or wax that makes floors slippery. If you must use wax, use non-skid floor wax. Do not have throw rugs and other  things on the floor that can make you trip. What can I do with my stairs? Do not leave any items on the stairs. Make sure that there are handrails on both sides of the stairs and use them. Fix handrails that are broken or loose. Make sure that handrails are as long as the stairways. Check any carpeting to make sure that it is firmly attached to the stairs. Fix any carpet that is loose or worn. Avoid having throw rugs at the top or bottom of the stairs. If you do have throw rugs, attach them to the floor with carpet tape. Make sure that you have a light switch at the top of the stairs and the bottom of the stairs. If you do not have them, ask someone to add them for you. What else can I do to help prevent falls? Wear shoes that: Do not have high heels. Have rubber bottoms. Are comfortable and fit you well. Are closed at the toe. Do not wear sandals. If you use a stepladder: Make sure that it is fully opened.  Do not climb a closed stepladder. Make sure that both sides of the stepladder are locked into place. Ask someone to hold it for you, if possible. Clearly mark and make sure that you can see: Any grab bars or handrails. First and last steps. Where the edge of each step is. Use tools that help you move around (mobility aids) if they are needed. These include: Canes. Walkers. Scooters. Crutches. Turn on the lights when you go into a dark area. Replace any light bulbs as soon as they burn out. Set up your furniture so you have a clear path. Avoid moving your furniture around. If any of your floors are uneven, fix them. If there are any pets around you, be aware of where they are. Review your medicines with your doctor. Some medicines can make you feel dizzy. This can increase your chance of falling. Ask your doctor what other things that you can do to help prevent falls. This information is not intended to replace advice given to you by your health care provider. Make sure you discuss any questions you have with your health care provider. Document Released: 10/17/2008 Document Revised: 05/29/2015 Document Reviewed: 01/25/2014 Elsevier Interactive Patient Education  2017 [employer].

## 2022-04-06 NOTE — Progress Notes (Addendum)
Subjective:   Trayvonne Hellard is a 82 y.o. male who presents for Medicare Annual/Subsequent preventive examination.  Review of Systems    Virtual Visit via Telephone Note  I connected with  [name] on 04/12/22 at 11:00 AM EDT by telephone and verified that I am speaking with the correct person using two identifiers.  Location: Patient: Home Provider: Office Persons participating in the virtual visit: patient/Nurse Health Advisor   I discussed the limitations, risks, security and privacy concerns of performing an evaluation and management service by telephone and the availability of in person appointments. The patient expressed understanding and agreed to proceed.  Interactive audio and video telecommunications were attempted between this nurse and patient, however failed, due to patient having technical difficulties OR patient did not have access to video capability.  We continued and completed visit with audio only.  Some vital signs may be absent or patient reported.   Tillie Rung, LPN  Cardiac Risk Factors include: advanced age (>34men, >18 women);hypertension;male gender     Objective:    Today's Vitals   04/06/22 1212  Weight: 144 lb (65.3 kg)  Height: 5\' 7"  (1.702 m)   Body mass index is 22.55 kg/m.     04/06/2022   12:20 PM 03/03/2022   12:09 PM 11/05/2020   11:23 AM 09/25/2020    1:09 PM 08/25/2020    1:06 PM 08/16/2019    8:20 AM 06/26/2019   10:10 AM  Advanced Directives  Does Patient Have a Medical Advance Directive? Yes Yes Yes No Yes Yes Yes  Type of Estate agent of [place];Living will Healthcare Power of [place];Living will Healthcare Power of [place];Living will  Healthcare Power of [place];Living will;Out of facility DNR (pink MOST or yellow form) Healthcare Power of [place];Living will Healthcare Power of [place];Living will  Does patient want to make changes to medical advance directive?  No - Patient  declined No - Patient declined    No - Patient declined  Copy of Healthcare Power of Attorney in Chart? No - copy requested No - copy requested, Physician notified    No - copy requested No - copy requested  Would patient like information on creating a medical advance directive?      No - Patient declined No - Patient declined    Current Medications (verified) Outpatient Encounter Medications as of 04/06/2022  Medication Sig   amLODipine (NORVASC) 5 MG tablet Take 1 tablet (5 mg total) by mouth in the morning and at bedtime.   aspirin EC 81 MG tablet Take 81 mg by mouth daily.   hydrALAZINE (APRESOLINE) 25 MG tablet TAKE 1 TABLET (25 MG) BY MOUTH IN THE MORNING AND AT BEDTIME   irbesartan (AVAPRO) 150 MG tablet Take 1&1/2 tablets daily   Omega 3-6-9 Fatty Acids (OMEGA 3-6-9 COMPLEX PO) Take by mouth.   rosuvastatin (CRESTOR) 10 MG tablet TAKE 1 TABLET BY MOUTH EVERY DAY   vitamin B-12 (CYANOCOBALAMIN) 1000 MCG tablet Take 1,000 mcg by mouth daily.   vitamin C (ASCORBIC ACID) 500 MG tablet Take 500 mg by mouth daily.   No facility-administered encounter medications on file as of 04/06/2022.    Allergies (verified) Hydrochlorothiazide   History: Past Medical History:  Diagnosis Date   Carotid artery occlusion    Complication of anesthesia    pt states following last surgery he was awake for over 24 hours following surgery   Hypertension    Stroke    Past Surgical History:  Procedure Laterality Date   ENDARTERECTOMY Left 08/22/2019   Procedure: LEFT CAROTID ENDARTERECTOMY WITH BOVIE PATCH ANGIOPLASTY;  Surgeon: Serafina Mitchell, MD;  Location: [facility] OR;  Service: Vascular;  Laterality: Left;   LAPAROSCOPIC NEPHRECTOMY Right    NECK SURGERY     PROSTATE SURGERY     Family History  Problem Relation Age of Onset   Dementia Mother    Heart failure Father        Deteriration of the heart   Stroke Brother    Social History   Socioeconomic History   Marital status: Widowed    Spouse  name: Not on file   Number of children: 3   Years of education: Not on file   Highest education level: Not on file  Occupational History   Not on file  Tobacco Use   Smoking status: Former   Smokeless tobacco: Never  Vaping Use   Vaping Use: Never used  Substance and Sexual Activity   Alcohol use: Never   Drug use: Never   Sexual activity: Not on file  Other Topics Concern   Not on file  Social History Narrative   Right Handed    Lives in a two story home    Father to 3 girls    Social Determinants of Health   Financial Resource Strain: Low Risk  (04/06/2022)   Overall Financial Resource Strain (CARDIA)    Difficulty of Paying Living Expenses: Not hard at all  Food Insecurity: No Food Insecurity (04/06/2022)   Hunger Vital Sign    Worried About Running Out of Food in the Last Year: Never true    [place] in the Last Year: Never true  Transportation Needs: No Transportation Needs (04/06/2022)   PRAPARE - Hydrologist (Medical): No    Lack of Transportation (Non-Medical): No  Physical Activity: Insufficiently Active (04/06/2022)   Exercise Vital Sign    Days of Exercise per Week: 2 days    Minutes of Exercise per Session: 40 min  Stress: No Stress Concern Present (04/06/2022)   [place]    Feeling of Stress : Not at all  Social Connections: [place] (04/06/2022)   Social Connection and Isolation Panel [NHANES]    Frequency of Communication with Friends and Family: More than three times a week    Frequency of Social Gatherings with Friends and Family: More than three times a week    Attends Religious Services: More than 4 times per year    Active Member of [employer] or Organizations: Yes    Attends Music therapist: More than 4 times per year    Marital Status: Married    Tobacco Counseling Counseling given: Not Answered   Clinical Intake:  Pre-visit  preparation completed: No  Pain : No/denies pain     BMI - recorded: 22.55 Nutritional Status: BMI of 19-24  Normal Nutritional Risks: None Diabetes: No  How often do you need to have someone help you when you read instructions, pamphlets, or other written materials from your doctor or pharmacy?: 1 - Never  Diabetic? No  Interpreter Needed?: No  Information entered by :: Rolene Arbour LPN   Activities of Daily Living    04/06/2022   12:17 PM  In your present state of health, do you have any difficulty performing the following activities:  Hearing? 0  Vision? 0  Difficulty concentrating or making decisions? 0  Walking  or climbing stairs? 0  Dressing or bathing? 0  Doing errands, shopping? 0  Preparing Food and eating ? N  Using the Toilet? N  In the past six months, have you accidently leaked urine? N  Do you have problems with loss of bowel control? N  Managing your Medications? N  Managing your Finances? N  Housekeeping or managing your Housekeeping? N    Patient Care Team: [place], Blondell Reveal, MD as PCP - General (Family Medicine) Lorretta Harp, MD as PCP - Cardiology (Cardiology) Alda Berthold, DO as Consulting Physician (Neurology)  Indicate any recent Medical Services you may have received from other than [facility] providers in the past year (date may be approximate).     Assessment:   This is a routine wellness examination for [employer].  Hearing/Vision screen Hearing Screening - Comments:: Denies hearing difficulties   Vision Screening - Comments:: Wears rx glasses - up to date with routine eye exams with  Dr Valetta Close  Dietary issues and exercise activities discussed: Current Exercise Habits: Home exercise routine, Type of exercise: walking, Time (Minutes): 40, Frequency (Times/Week): 2, Weekly Exercise (Minutes/Week): 80, Intensity: Moderate, Exercise limited by: None identified   Goals Addressed               This Visit's Progress     No currnt  goals (pt-stated)         Depression Screen    04/06/2022   12:16 PM  PHQ 2/9 Scores  PHQ - 2 Score 0    Fall Risk    04/06/2022   12:18 PM 08/25/2020    1:06 PM  [place] in the past year? 0 0  Number falls in past yr: 0   Injury with Fall? 0 0  Risk for fall due to : No Fall Risks   Follow up Falls prevention discussed     [place]:  Any stairs in or around the home? Yes  If so, are there any without handrails? No  Home free of loose throw rugs in walkways, pet beds, electrical cords, etc? Yes  Adequate lighting in your home to reduce risk of falls? Yes   ASSISTIVE DEVICES UTILIZED TO PREVENT FALLS:  Life alert? No  Use of a cane, walker or w/c? No  Grab bars in the bathroom? Yes Shower chair or bench in shower? Yes  Elevated toilet seat or a handicapped toilet? No   TIMED UP AND GO:  Was the test performed? No . Audio Visit   Cognitive Function:        04/06/2022   12:20 PM  6CIT Screen  What Year? 0 points  What month? 0 points  What time? 0 points  Count back from 20 0 points  Months in reverse 4 points  Repeat phrase 0 points  Total Score 4 points    Immunizations Immunization History  Administered Date(s) Administered   Tdap 03/03/2022    TDAP status: Up to date  Flu Vaccine status: Up to date  Pneumococcal vaccine status: Due, Education has been provided regarding the importance of this vaccine. Advised may receive this vaccine at local pharmacy or Health Dept. Aware to provide a copy of the vaccination record if obtained from local pharmacy or Health Dept. Verbalized acceptance and understanding.  Covid-19 vaccine status: Completed vaccines  Qualifies for Shingles Vaccine? Yes   Zostavax completed No   Shingrix Completed?: No.    Education has been provided  regarding the importance of this vaccine. Patient has been advised to call insurance company to determine out of pocket expense if they have  not yet received this vaccine. Advised may also receive vaccine at local pharmacy or Health Dept. Verbalized acceptance and understanding.  Screening Tests Health Maintenance  Topic Date Due   COVID-19 Vaccine (3 - 2023-24 season) 04/22/2022 (Originally 09/04/2021)   Zoster Vaccines- Shingrix (1 of 2) 07/06/2022 (Originally [birth year]1989)   Pneumonia Vaccine 7+ Years old (1 of 2 - PCV) 04/06/2023 (Originally 07/23/1946)   INFLUENZA VACCINE  08/05/2022   Medicare Annual Wellness (AWV)  04/06/2023   DTaP/Tdap/Td (2 - Td or Tdap) 03/03/2032   HPV VACCINES  Aged Out    Health Maintenance  There are no preventive care reminders to display for this patient.   Colorectal cancer screening: No longer required.   Lung Cancer Screening: (Low Dose CT Chest recommended if Age 69-80 years, 30 pack-year currently smoking OR have quit w/in 15years.) does not qualify.     Additional Screening:  Hepatitis C Screening: does not qualify; Completed   Vision Screening: Recommended annual ophthalmology exams for early detection of glaucoma and other disorders of the eye. Is the patient up to date with their annual eye exam?  Yes  Who is the provider or what is the name of the office in which the patient attends annual eye exams? Dr Cathey Endow If pt is not established with a provider, would they like to be referred to a provider to establish care? No .   Dental Screening: Recommended annual dental exams for proper oral hygiene  Community Resource Referral / Chronic Care Management:  CRR required this visit?  No   CCM required this visit?  No      Plan:     I have personally reviewed and noted the following in the patient's chart:   Medical and social history Use of alcohol, tobacco or illicit drugs  Current medications and supplements including opioid prescriptions. Patient is not currently taking opioid prescriptions. Functional ability and status Nutritional status Physical activity Advanced  directives List of other physicians Hospitalizations, surgeries, and ER visits in previous 12 months Vitals Screenings to include cognitive, depression, and falls Referrals and appointments  In addition, I have reviewed and discussed with patient certain preventive protocols, quality metrics, and best practice recommendations. A written personalized care plan for preventive services as well as general preventive health recommendations were provided to patient.     Tillie Rung, LPN   01/09/1094   Nurse Notes: None

## 2022-04-14 ENCOUNTER — Telehealth: Payer: Self-pay | Admitting: Nurse Practitioner

## 2022-04-14 NOTE — Telephone Encounter (Signed)
Patient's daughter Camelia Eng called for her father's monitor results.

## 2022-04-14 NOTE — Telephone Encounter (Signed)
Returned call. Pt's daughter made aware it takes about 2-3 weeks to get the monitor results back. She verbalized understanding, no further questions at this time.

## 2022-04-15 ENCOUNTER — Ambulatory Visit (HOSPITAL_COMMUNITY): Payer: Medicare Other | Attending: Internal Medicine

## 2022-04-15 DIAGNOSIS — Z87898 Personal history of other specified conditions: Secondary | ICD-10-CM | POA: Diagnosis not present

## 2022-04-15 DIAGNOSIS — I10 Essential (primary) hypertension: Secondary | ICD-10-CM

## 2022-04-15 LAB — ECHOCARDIOGRAM COMPLETE
Area-P 1/2: 2.55 cm2
S' Lateral: 1.6 cm

## 2022-04-21 ENCOUNTER — Telehealth: Payer: Self-pay

## 2022-04-21 NOTE — Telephone Encounter (Signed)
Spoke with pt. Pt was notified of monitor and echo results. Pt has a f/u scheduled for 05/07/22 to discuss monitor results. Pts daughter is also aware of results.

## 2022-04-27 ENCOUNTER — Telehealth: Payer: Self-pay | Admitting: Cardiovascular Disease

## 2022-04-27 DIAGNOSIS — E782 Mixed hyperlipidemia: Secondary | ICD-10-CM

## 2022-04-27 MED ORDER — ROSUVASTATIN CALCIUM 10 MG PO TABS: 10.0000 mg | ORAL_TABLET | Freq: Every day | ORAL | 2 refills | Status: DC

## 2022-04-27 NOTE — Telephone Encounter (Signed)
*  STAT* If patient is at the pharmacy, call can be transferred to refill team.   1. Which medications need to be refilled? (please list name of each medication and dose if known) rosuvastatin (CRESTOR) 10 MG tablet   2. Which pharmacy/location (including street and city if local pharmacy) is medication to be sent to?  [id number] - [place], [place] - [address]    3. Do they need a 30 day or 90 day supply? 90

## 2022-04-27 NOTE — Telephone Encounter (Signed)
*  STAT* If patient is at the pharmacy, call can be transferred to refill team.   1. Which medications need to be refilled? (please list name of each medication and dose if known) irbesartan (AVAPRO) 150 MG tablet   2. Which pharmacy/location (including street and city if local pharmacy) is medication to be sent to?  [id number] - [place], [place] - [address]    3. Do they need a 30 day or 90 day supply? 90 day

## 2022-04-27 NOTE — Telephone Encounter (Signed)
Pt calling back for a refill, pt asked for  tablets

## 2022-04-27 NOTE — Telephone Encounter (Signed)
Refills has been sent to the pharmacy. 

## 2022-04-28 MED ORDER — IRBESARTAN 150 MG PO TABS: 225.0000 mg | ORAL_TABLET | Freq: Every day | ORAL | 1 refills | Status: DC

## 2022-04-28 NOTE — Telephone Encounter (Signed)
Refills has been sent to the pharmacy. 

## 2022-05-03 ENCOUNTER — Ambulatory Visit (HOSPITAL_BASED_OUTPATIENT_CLINIC_OR_DEPARTMENT_OTHER): Payer: Medicare Other

## 2022-05-04 LAB — CBC WITH DIFFERENTIAL/PLATELET
Basophils Absolute: 0.1 10*3/uL (ref 0.0–0.2)
Basos: 1 %
EOS (ABSOLUTE): 0.4 10*3/uL (ref 0.0–0.4)
Eos: 6 %
Hematocrit: 42.1 % (ref 37.5–51.0)
Hemoglobin: 13.6 g/dL (ref 13.0–17.7)
Immature Grans (Abs): 0 10*3/uL (ref 0.0–0.1)
Immature Granulocytes: 0 %
Lymphocytes Absolute: 1.7 10*3/uL (ref 0.7–3.1)
Lymphs: 22 %
MCH: 29 pg (ref 26.6–33.0)
MCHC: 32.3 g/dL (ref 31.5–35.7)
MCV: 90 fL (ref 79–97)
Monocytes Absolute: 1.1 10*3/uL — ABNORMAL HIGH (ref 0.1–0.9)
Monocytes: 14 %
Neutrophils Absolute: 4.4 10*3/uL (ref 1.4–7.0)
Neutrophils: 57 %
Platelets: 317 10*3/uL (ref 150–450)
RBC: 4.69 x10E6/uL (ref 4.14–5.80)
RDW: 13.6 % (ref 11.6–15.4)
WBC: 7.6 10*3/uL (ref 3.4–10.8)

## 2022-05-04 LAB — COMPREHENSIVE METABOLIC PANEL
ALT: 18 IU/L (ref 0–44)
AST: 25 IU/L (ref 0–40)
Albumin/Globulin Ratio: 1.6 (ref 1.2–2.2)
Albumin: 4.3 g/dL (ref 3.7–4.7)
Alkaline Phosphatase: 76 IU/L (ref 44–121)
BUN/Creatinine Ratio: 13 (ref 10–24)
BUN: 17 mg/dL (ref 8–27)
Bilirubin Total: 0.4 mg/dL (ref 0.0–1.2)
CO2: 24 mmol/L (ref 20–29)
Calcium: 10 mg/dL (ref 8.6–10.2)
Chloride: 103 mmol/L (ref 96–106)
Creatinine, Ser: 1.3 mg/dL — ABNORMAL HIGH (ref 0.76–1.27)
Globulin, Total: 2.7 g/dL (ref 1.5–4.5)
Glucose: 85 mg/dL (ref 70–99)
Potassium: 4.5 mmol/L (ref 3.5–5.2)
Sodium: 144 mmol/L (ref 134–144)
Total Protein: 7 g/dL (ref 6.0–8.5)
eGFR: 55 mL/min/{1.73_m2} — ABNORMAL LOW (ref >59)

## 2022-05-04 LAB — HEMOGLOBIN A1C
Est. average glucose Bld gHb Est-mCnc: 126 mg/dL
Hgb A1c MFr Bld: 6 % — ABNORMAL HIGH (ref 4.8–5.6)

## 2022-05-04 LAB — LIPID PANEL
Chol/HDL Ratio: 2.5 ratio (ref 0.0–5.0)
Cholesterol, Total: 130 mg/dL (ref 100–199)
HDL: 51 mg/dL (ref >39)
LDL Chol Calc (NIH): 64 mg/dL (ref 0–99)
Triglycerides: 76 mg/dL (ref 0–149)
VLDL Cholesterol Cal: 15 mg/dL (ref 5–40)

## 2022-05-07 ENCOUNTER — Encounter: Payer: Self-pay | Admitting: Nurse Practitioner

## 2022-05-07 ENCOUNTER — Ambulatory Visit: Payer: Medicare Other | Attending: Nurse Practitioner | Admitting: Nurse Practitioner

## 2022-05-07 VITALS — BP 136/70 | HR 78 | Ht 66.0 in | Wt 145.0 lb

## 2022-05-07 DIAGNOSIS — Z87898 Personal history of other specified conditions: Secondary | ICD-10-CM

## 2022-05-07 DIAGNOSIS — I10 Essential (primary) hypertension: Secondary | ICD-10-CM | POA: Diagnosis not present

## 2022-05-07 DIAGNOSIS — I6523 Occlusion and stenosis of bilateral carotid arteries: Secondary | ICD-10-CM | POA: Diagnosis not present

## 2022-05-07 DIAGNOSIS — Z8673 Personal history of transient ischemic attack (TIA), and cerebral infarction without residual deficits: Secondary | ICD-10-CM | POA: Diagnosis present

## 2022-05-07 DIAGNOSIS — E785 Hyperlipidemia, unspecified: Secondary | ICD-10-CM | POA: Diagnosis present

## 2022-05-07 DIAGNOSIS — I471 Supraventricular tachycardia, unspecified: Secondary | ICD-10-CM

## 2022-05-07 MED ORDER — METOPROLOL TARTRATE 25 MG PO TABS: 12.5000 mg | ORAL_TABLET | Freq: Every day | ORAL | 3 refills | Status: DC

## 2022-05-07 NOTE — Patient Instructions (Signed)
Medication Instructions:  Start Metoprolol Succinate 12.5 mg daily  *If you need a refill on your cardiac medications before your next appointment, please call your pharmacy*   Lab Work: NONE ordered at this time of appointment   Testing/Procedures: NONE ordered at this time of appointment     Follow-Up: At [facility], you and your health needs are our priority.  As part of our continuing mission to provide you with exceptional heart care, we have created designated Provider Care Teams.  These Care Teams include your primary Cardiologist (physician) and Advanced Practice Providers (APPs -  Physician Assistants and Nurse Practitioners) who all work together to provide you with the care you need, when you need it.  We recommend signing up for the patient portal called "MyChart".  Sign up information is provided on this After Visit Summary.  MyChart is used to connect with patients for Virtual Visits (Telemedicine).  Patients are able to view lab/test results, encounter notes, upcoming appointments, etc.  Non-urgent messages can be sent to your provider as well.   To learn more about what you can do with MyChart, go to [email or web address].    Your next appointment:    Keep follow up   Provider:   Nanetta Batty, MD     Other Instructions

## 2022-05-07 NOTE — Progress Notes (Unsigned)
Office Visit    Patient Name: Rhonald Saelens [facility] Date of Encounter: 05/07/2022  Primary Care Provider:  [place], Buren Kos, MD Primary Cardiologist:  Nanetta Batty, MD  Chief Complaint   82 year old male with a history of syncope, carotid artery stenosis s/p L CEA in 2021, hypertension, hyperlipidemia, and stroke who presents for follow-up related to syncope and hypertension.   Past Medical History    Past Medical History:  Diagnosis Date   Carotid artery occlusion    Complication of anesthesia    pt states following last surgery he was awake for over 24 hours following surgery   Hypertension    Stroke [facility])    Past Surgical History:  Procedure Laterality Date   ENDARTERECTOMY Left 08/22/2019   Procedure: LEFT CAROTID ENDARTERECTOMY WITH BOVIE PATCH ANGIOPLASTY;  Surgeon: Nada Libman, MD;  Location: [facility] OR;  Service: Vascular;  Laterality: Left;   LAPAROSCOPIC NEPHRECTOMY Right    NECK SURGERY     PROSTATE SURGERY      Allergies  Allergies  Allergen Reactions   Hydrochlorothiazide     Other reaction(s): Other (See Comments) Leg pain      Labs/Other Studies Reviewed    The following studies were reviewed today: Lexiscan myoview 06/2019: Horizontal ST segment depression of 1 mm was noted during stress in the II, III and aVF leads. Nuclear stress EF: 55%. The left ventricular ejection fraction is normal (55-65%). Defect 1: There is a medium defect of mild severity present in the mid anteroseptal, apical septal and apex location. Defect 2: There is a medium defect of mild severity present in the basal inferior, mid inferior and apical inferior location. Findings consistent with prior myocardial infarction versus artifact. Suspect artifact given normal wall motion in these areas This is a low risk study.   Fixed perfusion defects in anteroseptum, apex, and inferior walls, with normal wall motion in these regions, suggestive of artifact.     Carotid Arterial  Duplex Study 08/2021:   Summary: Right Carotid: Velocities in the right ICA are consistent with a 40-59% stenosis.  Left Carotid: Velocities in the left ICA are consistent with a 1-39% stenosis.  Vertebrals: Bilateral vertebral arteries demonstrate antegrade flow.  *See table(s) above for measurements and observations.   Electronically signed by Coral Else MD on 08/17/2021 at 8:20:04 PM.   14-day Zio 04/2022: Patch Wear Time:  13 days and 23 hours (2024-03-15T20:08:52-0400 to 2024-03-29T20:07:27-0400)   Patient had a min HR of 50 bpm, max HR of 182 bpm, and avg HR of 71 bpm. Predominant underlying rhythm was Sinus Rhythm. 206 Supraventricular Tachycardia runs occurred, the run with the fastest interval lasting 5 beats with a max rate of 182 bpm, the  longest lasting 50 mins 55 secs with an avg rate of 104 bpm. Isolated SVEs were occasional (1.2%, 16951), SVE Couplets were rare (<1.0%, 3398), and SVE Triplets were rare (<1.0%, 491). Isolated VEs were rare (<1.0%, 330), VE Couplets were rare (<1.0%,  14), and VE Triplets were rare (<1.0%, 3).    SR/SB/ST Occasional PVCs, freq PACs. Freq runs of SVT (206 runs, longest 55 min) Needs ROV to discuss  Echo 04/2022: IMPRESSIONS    1. Left ventricular ejection fraction, by estimation, is 60 to 65%. The  left ventricle has normal function. The left ventricle has no regional  wall motion abnormalities. There is mild concentric left ventricular  hypertrophy. Left ventricular diastolic  parameters are consistent with Grade I diastolic dysfunction (impaired  relaxation).  2. Right ventricular systolic function is normal. The right ventricular  size is normal. Tricuspid regurgitation signal is inadequate for assessing  PA pressure.   3. No evidence of mitral valve regurgitation.   4. The aortic valve is grossly normal. Aortic valve regurgitation is not  visualized.   5. The inferior vena cava is normal in size with greater than 50%   respiratory variability, suggesting right atrial pressure of 3 mmHg.   Comparison(s): No prior Echocardiogram.   Recent Labs: 05/03/2022: ALT 18; BUN 17; Creatinine, Ser 1.30; Hemoglobin 13.6; Platelets 317; Potassium 4.5; Sodium 144  Recent Lipid Panel    Component Value Date/Time   CHOL 130 05/03/2022 0807   TRIG 76 05/03/2022 0807   HDL 51 05/03/2022 0807   CHOLHDL 2.5 05/03/2022 0807   LDLCALC 64 05/03/2022 0807    History of Present Illness    82 year old male with the above past medical history including syncope, carotid artery stenosis s/p L CEA in 2021, hypertension, hyperlipidemia, and stroke.   He has a history of prior stroke with residual left upper and lower extremity weakness.  Carotid Dopplers in 06/2019 showed high-grade LICA stenosis.  He was referred to vascular surgery and underwent successful left CEA in 08/2019.  Preoperative Myoview was low risk.  Most recent carotid Dopplers in 08/2021 showed 40-59% R ICA stenosis, 1-39% L ICA stenosis. He last saw Dr. Allyson Sabal on 01/08/2021 noted elevated blood pressure.  He was referred to hypertension clinic Pharm.D.  He was seen in the ED on 03/03/2022 in the setting of syncopal episode that resulted in a fall.  He sustained a 4 cm laceration to his head.  EKG was stable, chest x-ray was unremarkable.  CT of the head/C-spine was negative for acute abnormality. His case was discussed with on-call cardiologist.  It was felt that his symptoms were most consistent with vasovagal syncope.  Patient was advised to hold his carvedilol and follow-up with cardiology as an outpatient.  He was last seen in the office on 14 2024 was stable from a cardiac standpoint.  She denies any recurrent presyncope, syncope.  He denied symptoms concerning for angina.  He did note a slight increase in resting heart rate since discontinuing carvedilol.  14-day ZIO revealed sinus rhythm, sinus bradycardia, sinus tachycardia, occasional PVCs, frequent PACs, frequent runs of  SVT.  Echocardiogram showed EF 60 to 65%, no RWMA, G1 DD, normal RV systolic function, no significant valvular abnormalities.  He presents today for follow-up. Since his last visit has been stable overall from a cardiac standpoint. BP and heart rate have been well-controlled. He denies symptoms concerning for angina.  He notes an occasional fluttering in his chest when he lays down to sleep at night, denies any significant palpitations, denies any significant dizziness, presyncope, or syncope. Overall, he reports feeling well.   Home Medications    Current Outpatient Medications  Medication Sig Dispense Refill   amLODipine (NORVASC) 5 MG tablet Take 1 tablet (5 mg total) by mouth in the morning and at bedtime. 180 tablet 3   aspirin EC 81 MG tablet Take 81 mg by mouth daily.     hydrALAZINE (APRESOLINE) 25 MG tablet TAKE 1 TABLET (25 MG) BY MOUTH IN THE MORNING AND AT BEDTIME 180 tablet 3   irbesartan (AVAPRO) 150 MG tablet Take 1.5 tablets (225 mg total) by mouth daily. 135 tablet 1   Omega 3-6-9 Fatty Acids (OMEGA 3-6-9 COMPLEX PO) Take by mouth.     rosuvastatin (CRESTOR) 10  MG tablet Take 1 tablet (10 mg total) by mouth daily. 90 tablet 2   vitamin B-12 (CYANOCOBALAMIN) 1000 MCG tablet Take 1,000 mcg by mouth daily.     vitamin C (ASCORBIC ACID) 500 MG tablet Take 500 mg by mouth daily.     No current facility-administered medications for this visit.     Review of Systems    He denies chest pain, dyspnea, pnd, orthopnea, n, v, dizziness, syncope, edema, weight gain, or early satiety. All other systems reviewed and are otherwise negative except as noted above.   Physical Exam    VS:  BP 136/70 (BP Location: Left Arm, Patient Position: Sitting, Cuff Size: Normal)   Pulse 78   Ht 5\' 6"  (1.676 m)   Wt 145 lb (65.8 kg)   BMI 23.40 kg/m   GEN: Well nourished, well developed, in no acute distress. HEENT: normal. Neck: Supple, no JVD, carotid bruits, or masses. Cardiac: RRR, no  murmurs, rubs, or gallops. No clubbing, cyanosis, edema.  Radials/DP/PT 2+ and equal bilaterally.  Respiratory:  Respirations regular and unlabored, clear to auscultation bilaterally. GI: Soft, nontender, nondistended, BS + x 4. MS: no deformity or atrophy. Skin: warm and dry, no rash. Neuro:  Strength and sensation are intact. Psych: Normal affect.  Accessory Clinical Findings    ECG personally reviewed by me today -no EKG in office today..   Lab Results  Component Value Date   WBC 7.6 05/03/2022   HGB 13.6 05/03/2022   HCT 42.1 05/03/2022   MCV 90 05/03/2022   PLT 317 05/03/2022   Lab Results  Component Value Date   CREATININE 1.30 (H) 05/03/2022   BUN 17 05/03/2022   NA 144 05/03/2022   K 4.5 05/03/2022   CL 103 05/03/2022   CO2 24 05/03/2022   Lab Results  Component Value Date   ALT 18 05/03/2022   AST 25 05/03/2022   ALKPHOS 76 05/03/2022   BILITOT 0.4 05/03/2022   Lab Results  Component Value Date   CHOL 130 05/03/2022   HDL 51 05/03/2022   LDLCALC 64 05/03/2022   TRIG 76 05/03/2022   CHOLHDL 2.5 05/03/2022    Lab Results  Component Value Date   HGBA1C 6.0 (H) 05/03/2022    Assessment & Plan    1. Syncope: Thought to be vasovagal.  He was also borderline hypotensive, bradycardic at the time.  Carvedilol was discontinued. Denies any recurrent syncope, presyncope.    14-day ZIO revealed sinus rhythm, sinus bradycardia, sinus tachycardia, occasional PVCs, frequent PACs, frequent runs of SVT.  Echo showed EF 60 to 65%, no RWMA, G1 DD, normal RV systolic function, no significant valvular abnormalities. Discussed ED precautions.   2. SVT/PVCs/PACs: 14-day ZIO in 04/2022 revealed sinus rhythm, sinus bradycardia, sinus tachycardia, occasional PVCs, frequent PACs, frequent runs of SVT. He is generally asymptomatic with his SVT.  However, given frequency, will try to reinitiate low-dose beta-blocker.  Will start metoprolol succinate 12.5 mg daily.  Continue to monitor  symptoms, BP, HR.  Discussed ED precautions.    3. Hypertension: BP stable. Continue current antihypertensive regimen.    4. Carotid artery stenosis: S/p L CEA in 2021. Most recent carotid Dopplers in 08/2021 showed 40-59% R ICA stenosis, 1-39% L ICA stenosis.  Follows with vascular surgery.  Continue aspirin, Crestor.   5. Hyperlipidemia: LDL was 64 in 04/2022.  Continue Crestor.   6. History of CVA: No recurrence.  Recent CT of the head in the setting of syncope was negative  for acute abnormality.  Continue aspirin, Crestor.   7. Disposition: Follow-up as scheduled with Dr. Allyson Sabal in 09/2022.        Joylene Grapes, NP 05/07/2022, 2:22 PM

## 2022-05-09 ENCOUNTER — Encounter: Payer: Self-pay | Admitting: Nurse Practitioner

## 2022-05-10 ENCOUNTER — Ambulatory Visit (INDEPENDENT_AMBULATORY_CARE_PROVIDER_SITE_OTHER): Payer: Medicare Other | Admitting: Family Medicine

## 2022-05-10 ENCOUNTER — Encounter (HOSPITAL_BASED_OUTPATIENT_CLINIC_OR_DEPARTMENT_OTHER): Payer: Self-pay | Admitting: Family Medicine

## 2022-05-10 VITALS — BP 129/65 | HR 66 | Ht 66.0 in | Wt 143.0 lb

## 2022-05-10 DIAGNOSIS — I10 Essential (primary) hypertension: Secondary | ICD-10-CM | POA: Diagnosis not present

## 2022-05-10 DIAGNOSIS — N1831 Chronic kidney disease, stage 3a: Secondary | ICD-10-CM | POA: Diagnosis not present

## 2022-05-10 DIAGNOSIS — R7989 Other specified abnormal findings of blood chemistry: Secondary | ICD-10-CM | POA: Diagnosis not present

## 2022-05-10 NOTE — Progress Notes (Signed)
   Established Patient Office Visit  Subjective   Patient ID: Jonathan Savage, male    DOB: 12-03-1940  Age: 82 y.o. MRN: [id number]  Jonathan Savage is an 82 yo male patient who presents today for HTN management. He recently saw cardiology on 05/07/2022. He reports they decreased his metoprolol tartrate from 25mg  daily to 12.5mg  daily after review of a 14-day heart monitor.   Currently taking norvasc 5mg  twice daily, hydralazine 25mg  twice daily, irbesartan 225mg  daily, metoprolol tartrate 12.5mg  daily.   Stopped taking carvedilol. Seen in ED 03/03/2022 for syncopal episode.   Home BP readings mostly 120-130s/60-70s 116/70; HR 85 134/66; 68 124/63; 67 134/69; 76 123/58; 68 123/64; 80 127/65; 68 131/65; 57  Review of Systems  Constitutional:  Negative for malaise/fatigue.  Respiratory:  Negative for cough and shortness of breath.   Cardiovascular:  Negative for chest pain, palpitations, claudication and leg swelling.  Gastrointestinal:  Negative for abdominal pain, nausea and vomiting.  Musculoskeletal:  Negative for myalgias.  Neurological:  Negative for dizziness, weakness and headaches.  Psychiatric/Behavioral:  Negative for depression and suicidal ideas. The patient is not nervous/anxious.       Objective:     BP 129/65   Pulse 66   Ht 5\' 6"  (1.676 m)   Wt 143 lb (64.9 kg)   SpO2 99%   BMI 23.08 kg/m  BP Readings from Last 3 Encounters:  05/10/22 129/65  05/07/22 136/70  03/15/22 110/64     Physical Exam Constitutional:      Appearance: Normal appearance.  Cardiovascular:     Rate and Rhythm: Normal rate and regular rhythm.     Pulses: Normal pulses.     Heart sounds: Normal heart sounds.  Pulmonary:     Effort: Pulmonary effort is normal.     Breath sounds: Normal breath sounds.  Neurological:     Mental Status: He is alert.  Psychiatric:        Mood and Affect: Mood normal.        Behavior: Behavior normal.        Thought Content: Thought  content normal.        Judgment: Judgment normal.     Assessment & Plan:  1. Essential hypertension Currently taking norvasc 5mg  twice daily, hydralazine 25mg  twice daily, irbesartan 225mg  daily, and metoprolol tartrate 12.5mg  daily. First blood pressure in office was borderline hypotensive at 92/74 with HR 67. Recheck was 129/65, which are similar to home blood pressure readings. Cardiology recently decreased metoprolol 12.5mg . Will plan for close follow-up to ensure patient is not experiencing hypotensive readings at home. Discussed possible adjustment of medication.   2. CKD stage 3a, GFR 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) Followed by [facility] annually. See #3.  3. Elevated serum creatinine Followed by [facility] annually. Review of notes, nephrologist commented that kidney disease is mild and stable. Labs done 02/01/2022 with serum creatinine 1.30 and EGFR 55. Most recent labs done 05/03/2022 also with serum creatinine 1.30 and EGFR 55. Patient denies change in urinary symptoms. Will re-assess kidney function in near future.  - Comprehensive metabolic panel; Future   Return in about 4 weeks (around 06/07/2022) for HTN follow-up/ home BP readings .    Alyson Reedy, FNP

## 2022-06-21 ENCOUNTER — Other Ambulatory Visit (HOSPITAL_BASED_OUTPATIENT_CLINIC_OR_DEPARTMENT_OTHER): Payer: Self-pay | Admitting: Family Medicine

## 2022-06-21 ENCOUNTER — Encounter (HOSPITAL_BASED_OUTPATIENT_CLINIC_OR_DEPARTMENT_OTHER): Payer: Self-pay | Admitting: Family Medicine

## 2022-06-21 ENCOUNTER — Ambulatory Visit (INDEPENDENT_AMBULATORY_CARE_PROVIDER_SITE_OTHER): Payer: Medicare Other | Admitting: Family Medicine

## 2022-06-21 VITALS — BP 140/74 | HR 64 | Ht 66.0 in | Wt 143.0 lb

## 2022-06-21 DIAGNOSIS — R7303 Prediabetes: Secondary | ICD-10-CM

## 2022-06-21 DIAGNOSIS — I10 Essential (primary) hypertension: Secondary | ICD-10-CM | POA: Diagnosis not present

## 2022-06-21 DIAGNOSIS — N1831 Chronic kidney disease, stage 3a: Secondary | ICD-10-CM | POA: Diagnosis not present

## 2022-06-21 NOTE — Assessment & Plan Note (Signed)
A1c within prediabetes range on prior labs.  Primarily managing with lifestyle modifications.  We can plan to recheck hemoglobin A1c around time of next office visit for monitoring

## 2022-06-21 NOTE — Progress Notes (Signed)
    Procedures performed today:    None.  Independent interpretation of notes and tests performed by another provider:   None.  Brief History, Exam, Impression, and Recommendations:    BP (!) 140/74   Pulse 64   Ht 5\' 6"  (1.676 m)   Wt 143 lb (64.9 kg)   SpO2 98%   BMI 23.08 kg/m   CKD stage 3a, GFR 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) Assessment & Plan: Generally creatinine has remained stable.  Patient continues with prescribed medications to help with controlling blood pressure which is important in regards to protecting kidneys.  We can plan to check updated labs around time of next office visit to monitor creatinine  Orders: -     Comprehensive metabolic panel; Future  Essential hypertension Assessment & Plan: Patient continues with medications as prescribed. Has BP log from home, indicates good control. Patient still with occasional lightheadedness, no falls. No chest pain, headaches. Has follow-up with cardiology in about 3 months Blood pressure borderline on initial reading, improved on recheck Given good control at home, can continue with current medication regimen, no changes today. Recommend continuing with intermittent BP check at home, DASH diet Plan for follow-up in about 4 months or sooner as needed  Orders: -     Comprehensive metabolic panel; Future  Prediabetes Assessment & Plan: A1c within prediabetes range on prior labs.  Primarily managing with lifestyle modifications.  We can plan to recheck hemoglobin A1c around time of next office visit for monitoring  Orders: -     Hemoglobin A1c; Future  Return in about 4 months (around 10/21/2022) for hypertension, prediabetes.   ___________________________________________ Jonathan Radu de [place], MD, ABFM, CAQSM Primary Care and Sports Medicine [facility]

## 2022-06-21 NOTE — Assessment & Plan Note (Signed)
Generally creatinine has remained stable.  Patient continues with prescribed medications to help with controlling blood pressure which is important in regards to protecting kidneys.  We can plan to check updated labs around time of next office visit to monitor creatinine

## 2022-06-21 NOTE — Assessment & Plan Note (Signed)
Patient continues with medications as prescribed. Has BP log from home, indicates good control. Patient still with occasional lightheadedness, no falls. No chest pain, headaches. Has follow-up with cardiology in about 3 months Blood pressure borderline on initial reading, improved on recheck Given good control at home, can continue with current medication regimen, no changes today. Recommend continuing with intermittent BP check at home, DASH diet Plan for follow-up in about 4 months or sooner as needed

## 2022-06-22 LAB — COMPREHENSIVE METABOLIC PANEL
ALT: 19 IU/L (ref 0–44)
AST: 23 IU/L (ref 0–40)
Albumin: 4.5 g/dL (ref 3.7–4.7)
Alkaline Phosphatase: 75 IU/L (ref 44–121)
BUN/Creatinine Ratio: 17 (ref 10–24)
BUN: 20 mg/dL (ref 8–27)
Bilirubin Total: 0.4 mg/dL (ref 0.0–1.2)
CO2: 26 mmol/L (ref 20–29)
Calcium: 9.7 mg/dL (ref 8.6–10.2)
Chloride: 101 mmol/L (ref 96–106)
Creatinine, Ser: 1.21 mg/dL (ref 0.76–1.27)
Globulin, Total: 2.7 g/dL (ref 1.5–4.5)
Glucose: 109 mg/dL — ABNORMAL HIGH (ref 70–99)
Potassium: 4.1 mmol/L (ref 3.5–5.2)
Sodium: 140 mmol/L (ref 134–144)
Total Protein: 7.2 g/dL (ref 6.0–8.5)
eGFR: 60 mL/min/{1.73_m2} (ref >59)

## 2022-06-23 ENCOUNTER — Other Ambulatory Visit: Payer: Self-pay | Admitting: Cardiovascular Disease

## 2022-06-24 ENCOUNTER — Telehealth (HOSPITAL_BASED_OUTPATIENT_CLINIC_OR_DEPARTMENT_OTHER): Payer: Self-pay | Admitting: Family Medicine

## 2022-06-24 NOTE — Telephone Encounter (Signed)
Patient called left message wants a callback from nurse

## 2022-07-21 ENCOUNTER — Encounter (HOSPITAL_BASED_OUTPATIENT_CLINIC_OR_DEPARTMENT_OTHER): Payer: Self-pay | Admitting: Family Medicine

## 2022-07-21 ENCOUNTER — Ambulatory Visit (INDEPENDENT_AMBULATORY_CARE_PROVIDER_SITE_OTHER): Payer: Medicare Other | Admitting: Family Medicine

## 2022-07-21 VITALS — BP 130/76 | HR 79 | Ht 66.0 in | Wt 140.0 lb

## 2022-07-21 DIAGNOSIS — N529 Male erectile dysfunction, unspecified: Secondary | ICD-10-CM

## 2022-07-21 DIAGNOSIS — I10 Essential (primary) hypertension: Secondary | ICD-10-CM | POA: Diagnosis not present

## 2022-07-21 MED ORDER — SILDENAFIL CITRATE 50 MG PO TABS
25.0000 mg | ORAL_TABLET | Freq: Every day | ORAL | 0 refills | Status: DC | PRN
Start: 2022-07-21 — End: 2023-03-04

## 2022-07-21 NOTE — Progress Notes (Signed)
Subjective:   Jonathan Savage [facility] January 06, 1940 07/21/2022  Chief Complaint  Patient presents with   Medical Management of Chronic Issues    Pt has been feeling dizzy after taking his BP medicine.  States that this has been happening ever since he has been taking the meds.    HPI: Jonathan Savage presents today for re-assessment and management of chronic medical conditions.  HYPERTENSION: [name] presents for the medical management of hypertension. Patient states he has been having intermittent dizziness that occurs mostly in the morning after taking his medication. He states he sits down and symptoms relieve with rest. Patient has been taking antibiotic eyedrops for a fungal infection. Pt stopped taking his metoprolol for the past week and has had improvement of symptoms. No LOC or falls have occurred.   Patient's current hypertension medication regimen is: Amlodipine 5mg , Hydralazine 25mg , Irbsetarn 150mg , Metoprolol 12.5mg  (not taking metoprolol x 1 week due to dizziness)  Patient is  currently taking prescribed medications for HTN.  Patient is  regularly keeping a check on BP at home. Average systolic is 115-130, diastolic mid 60's.  Adhering to low sodium diet: Yes Exercising Regularly: No Denies headache, dizziness, CP, SHOB, vision changes.   BP Readings from Last 3 Encounters:  07/21/22 130/76  06/21/22 (!) 140/74  05/10/22 129/65    ERECTILE DYSFUNCTION:  Pt reports difficulty obtaining and maintaining an erection. He would like to use Sildenafil for assistance. Denies chest pains, uncontrolled HTN. Denies urinary issues.    The following portions of the patient's history were reviewed and updated as appropriate: past medical history, past surgical history, family history, social history, allergies, medications, and problem list.   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) 06/08/2019   Essential hypertension 06/05/2019   Hyperlipidemia 06/05/2019   Carotid bruit 06/05/2019   Past Medical History:  Diagnosis Date   Carotid artery occlusion    Complication of anesthesia    pt states following last surgery he was awake for over 24 hours following surgery   Hypertension    Stroke [facility])    Past Surgical History:  Procedure Laterality Date   ENDARTERECTOMY Left 08/22/2019   Procedure: LEFT CAROTID ENDARTERECTOMY WITH BOVIE PATCH ANGIOPLASTY;  Surgeon: Nada Libman, MD;  Location: [facility] OR;  Service: Vascular;  Laterality: Left;   LAPAROSCOPIC NEPHRECTOMY Right    NECK SURGERY     PROSTATE SURGERY     Family History  Problem Relation Age of Onset   Dementia Mother    Heart failure Father        Deteriration of the heart   Stroke Brother    Outpatient Medications Prior to Visit  Medication Sig Dispense Refill   amLODipine (NORVASC) 5 MG tablet Take 1 tablet (5 mg total) by mouth in the morning and at bedtime. 180 tablet 3   aspirin EC 81 MG tablet Take 81 mg by mouth daily.     hydrALAZINE (APRESOLINE) 25 MG tablet TAKE 1 TABLET BY MOUTH IN THE MORNING AND AT BEDTIME 180 tablet 3   irbesartan (AVAPRO) 150 MG tablet Take 1.5 tablets (225 mg total) by mouth daily. 135 tablet 1   Omega 3-6-9 Fatty Acids (OMEGA 3-6-9 COMPLEX PO) Take by mouth.  rosuvastatin (CRESTOR) 10 MG tablet Take 1 tablet (10 mg total) by mouth daily. 90 tablet 2   vitamin B-12 (CYANOCOBALAMIN) 1000 MCG tablet Take 1,000 mcg by mouth daily.     vitamin C (ASCORBIC ACID) 500 MG tablet Take 500 mg by mouth daily.     metoprolol tartrate (LOPRESSOR) 25 MG tablet Take 0.5 tablets (12.5 mg total) by mouth daily. (Patient not taking: Reported on 07/21/2022) 90 tablet 3   No facility-administered medications  prior to visit.   Allergies  Allergen Reactions   Hydrochlorothiazide     Other reaction(s): Other (See Comments) Leg pain      ROS: A complete ROS was performed with pertinent positives/negatives noted in the HPI. The remainder of the ROS are negative.    Objective:   Today's Vitals   07/21/22 0924 07/21/22 0942  BP: (!) 150/79 130/76  Pulse: 79   SpO2: 98%   Weight: 140 lb (63.5 kg)   Height: 5\' 6"  (1.676 m)     Physical Exam          GENERAL: Well-appearing, in NAD. Well nourished.  SKIN: Pink, warm and dry. No rash, lesion, ulceration, or ecchymoses.  Head: Normocephalic. NECK: Trachea midline. Full ROM w/o pain or tenderness. No lymphadenopathy.  RESPIRATORY: Chest wall symmetrical. Respirations even and non-labored. Breath sounds clear to auscultation bilaterally.  CARDIAC: S1, S2 present, regular rate and rhythm without murmur or gallops. Peripheral pulses 2+ bilaterally.  MSK: Muscle tone and strength appropriate for age. Joints w/o tenderness, redness, or swelling.  EXTREMITIES: Without clubbing, cyanosis, or edema.  NEUROLOGIC: No motor or sensory deficits. Steady, even gait. C2-C12 intact.  PSYCH/MENTAL STATUS: Alert, oriented x 3. Cooperative, appropriate mood and affect.   Health Maintenance Due  Topic Date Due   Zoster Vaccines- Shingrix (1 of 2) Never done   COVID-19 Vaccine (3 - 2023-24 season) 09/04/2021    No results found for any visits on 07/21/22.  The ASCVD Risk score (Arnett DK, et al., 2019) failed to calculate for the following reasons:   The 2019 ASCVD risk score is only valid for ages 35 to 37   The patient has a prior MI or stroke diagnosis     Assessment & Plan:   Essential hypertension Assessment & Plan: Patient to continue to withhold metoprolol as this was likely causing hypotension and dizziness.  His heart rate and blood pressure are well-controlled with other medications.  No changes to other antihypertensives.  Patient to log  blood pressure daily and return in 2 weeks for follow-up.  If symptoms continue to occur after stopping metoprolol, he is to reach out to PCP.  Discussed slowly changing positions and periods of rest and measures for avoidance of falls.   Erectile dysfunction, unspecified erectile dysfunction type Assessment & Plan: Discussed possible symptoms and side effects with this medication with use at an older age.  Patient verbalized understanding and would like to proceed.  Will start with sildenafil 25 mg as needed for sexual activity.  Discussed changing positions slowly, staying well-hydrated, and to seek ER if prolonged erection occurs.  I recommended waiting at least 1 to 2 weeks for metoprolol to be out of metabolism and system so that side effects are minimized.  Orders: -     Sildenafil Citrate; Take 0.5 tablets (25 mg total) by mouth daily as needed for erectile dysfunction.  Dispense: 10 tablet; Refill: 0    Meds ordered this encounter  Medications   sildenafil (VIAGRA) 50 MG  tablet    Sig: Take 0.5 tablets (25 mg total) by mouth daily as needed for erectile dysfunction.    Dispense:  10 tablet    Refill:  0    Order Specific Question:   Supervising Provider    Answer:   DE Peru, RAYMOND J [id number]   Lab Orders  No laboratory test(s) ordered today   No images are attached to the encounter or orders placed in the encounter.  Return in about 2 weeks (around 08/04/2022) for HYPERTENSION.    Patient to reach out to office if new, worrisome, or unresolved symptoms arise or if no improvement in patient's condition. Patient verbalized understanding and is agreeable to treatment plan. All questions answered to patient's satisfaction.   Of note, portions of this note may have been created with voice recognition software Physicist, medical). While this note has been edited for accuracy, occasional wrong-word or 'sound-a-like' substitutions may have occurred due to the inherent limitations of  voice recognition software.  Yolanda Manges, FNP

## 2022-07-21 NOTE — Patient Instructions (Addendum)
Stop the Metoprolol completely.    Do not use the Sildenafil for erectile dysfunction if dizziness has not resolved. I would recommend waiting 1-2 weeks for the metoprolol to be out of your system completely.

## 2022-07-21 NOTE — Assessment & Plan Note (Signed)
Patient to continue to withhold metoprolol as this was likely causing hypotension and dizziness.  His heart rate and blood pressure are well-controlled with other medications.  No changes to other antihypertensives.  Patient to log blood pressure daily and return in 2 weeks for follow-up.  If symptoms continue to occur after stopping metoprolol, he is to reach out to PCP.  Discussed slowly changing positions and periods of rest and measures for avoidance of falls.

## 2022-07-21 NOTE — Assessment & Plan Note (Signed)
Discussed possible symptoms and side effects with this medication with use at an older age.  Patient verbalized understanding and would like to proceed.  Will start with sildenafil 25 mg as needed for sexual activity.  Discussed changing positions slowly, staying well-hydrated, and to seek ER if prolonged erection occurs.  I recommended waiting at least 1 to 2 weeks for metoprolol to be out of metabolism and system so that side effects are minimized.

## 2022-08-02 ENCOUNTER — Other Ambulatory Visit: Payer: Self-pay | Admitting: *Deleted

## 2022-08-02 DIAGNOSIS — Z9889 Other specified postprocedural states: Secondary | ICD-10-CM

## 2022-08-02 DIAGNOSIS — I6523 Occlusion and stenosis of bilateral carotid arteries: Secondary | ICD-10-CM

## 2022-08-04 ENCOUNTER — Encounter (HOSPITAL_BASED_OUTPATIENT_CLINIC_OR_DEPARTMENT_OTHER): Payer: Self-pay | Admitting: Family Medicine

## 2022-08-04 ENCOUNTER — Ambulatory Visit (INDEPENDENT_AMBULATORY_CARE_PROVIDER_SITE_OTHER): Payer: Medicare Other | Admitting: Family Medicine

## 2022-08-04 VITALS — BP 142/75 | HR 86 | Ht 66.0 in | Wt 139.0 lb

## 2022-08-04 DIAGNOSIS — I10 Essential (primary) hypertension: Secondary | ICD-10-CM

## 2022-08-04 NOTE — Progress Notes (Signed)
    Procedures performed today:    None.  Independent interpretation of notes and tests performed by another provider:   None.  Brief History, Exam, Impression, and Recommendations:    BP (!) 142/75 (BP Location: Left Arm, Patient Position: Sitting, Cuff Size: Normal)   Pulse 86   Ht 5\' 6"  (1.676 m)   Wt 139 lb (63 kg)   SpO2 99%   BMI 22.44 kg/m   Essential hypertension Assessment & Plan: Blood pressure borderline in office today.  Patient has been having some changes to his medication allergies to ongoing symptoms, particularly have been having decreased doses of beta-blocker and more recently has completely discontinue beta-blocker.  Patient was having dizziness which has generally improved, however he still has some occasional feelings of overall fatigue/weakness which will last for about 5 to 10 minutes.  He indicates that these episodes can occur at random, did have recent 1 occurred during exertion.  Has been at least a few days since his last episode of this.  Denies any issues with chest pain, palpitations, lightheadedness or dizziness with these episodes. On exam today, blood pressure is borderline, stable on recheck.  Cardiovascular exam regular rate and rhythm, lungs clear to auscultation bilaterally. Uncertain etiology for ongoing symptoms, could be related to medications, feel that cardiac is less likely, however still a possibility.  He does have upcoming appointment with cardiology, recommend discussing with them as well.  For now, we can continue with monitoring, particularly given that more recent days have been normal without any symptoms occurring. Continue with current medication regimen, no changes to be made today   Return in about 3 months (around 11/04/2022) for hypertension.   ___________________________________________ Jonathan Tomeo de Peru, MD, ABFM, CAQSM [facility]

## 2022-08-04 NOTE — Assessment & Plan Note (Signed)
Blood pressure borderline in office today.  Patient has been having some changes to his medication allergies to ongoing symptoms, particularly have been having decreased doses of beta-blocker and more recently has completely discontinue beta-blocker.  Patient was having dizziness which has generally improved, however he still has some occasional feelings of overall fatigue/weakness which will last for about 5 to 10 minutes.  He indicates that these episodes can occur at random, did have recent 1 occurred during exertion.  Has been at least a few days since his last episode of this.  Denies any issues with chest pain, palpitations, lightheadedness or dizziness with these episodes. On exam today, blood pressure is borderline, stable on recheck.  Cardiovascular exam regular rate and rhythm, lungs clear to auscultation bilaterally. Uncertain etiology for ongoing symptoms, could be related to medications, feel that cardiac is less likely, however still a possibility.  He does have upcoming appointment with cardiology, recommend discussing with them as well.  For now, we can continue with monitoring, particularly given that more recent days have been normal without any symptoms occurring. Continue with current medication regimen, no changes to be made today

## 2022-08-23 ENCOUNTER — Ambulatory Visit (HOSPITAL_COMMUNITY)
Admission: RE | Admit: 2022-08-23 | Discharge: 2022-08-23 | Disposition: A | Discharge status: Home / Self Care | Payer: Medicare Other | Source: Ambulatory Visit | Attending: Surgery | Admitting: Surgery

## 2022-08-23 ENCOUNTER — Ambulatory Visit: Payer: Medicare Other | Admitting: Physician Assistant

## 2022-08-23 VITALS — BP 156/84 | HR 78 | Temp 98.1°F | Resp 18 | Ht 65.5 in | Wt 135.0 lb

## 2022-08-23 DIAGNOSIS — I6523 Occlusion and stenosis of bilateral carotid arteries: Secondary | ICD-10-CM | POA: Diagnosis present

## 2022-08-23 DIAGNOSIS — Z9889 Other specified postprocedural states: Secondary | ICD-10-CM

## 2022-08-23 NOTE — Progress Notes (Signed)
Office Note     CC:  follow up Requesting Provider:  de [place], Buren Kos, MD  HPI: Jonathan Savage is a 82 y.o. (1940-08-09) male who presents for surveillance of carotid artery stenosis.  He is status post left carotid endarterectomy with patch angioplasty on 08/22/2019 by Dr. Myra Gianotti due to high-grade asymptomatic stenosis.  He denies any neurological events since last office visit 1 year ago.  He also denies any diagnosis of CVA or TIA since last office visit.  He denies any current slurring speech, changes in vision, or one-sided weakness.  He has weakness and numbness in both feet that is symmetrical.  He plans to follow-up with a new PCP for further evaluation.  He is on a daily aspirin and statin.  He denies tobacco use.   Past Medical History:  Diagnosis Date   Carotid artery occlusion    Complication of anesthesia    pt states following last surgery he was awake for over 24 hours following surgery   Hypertension    Stroke [facility])     Past Surgical History:  Procedure Laterality Date   ENDARTERECTOMY Left 08/22/2019   Procedure: LEFT CAROTID ENDARTERECTOMY WITH BOVIE PATCH ANGIOPLASTY;  Surgeon: Nada Libman, MD;  Location: [facility] OR;  Service: Vascular;  Laterality: Left;   LAPAROSCOPIC NEPHRECTOMY Right    NECK SURGERY     PROSTATE SURGERY      Social History   Socioeconomic History   Marital status: Widowed    Spouse name: Not on file   Number of children: 3   Years of education: Not on file   Highest education level: Not on file  Occupational History   Not on file  Tobacco Use   Smoking status: Former   Smokeless tobacco: Never  Vaping Use   Vaping status: Never Used  Substance and Sexual Activity   Alcohol use: Never   Drug use: Never   Sexual activity: Not on file  Other Topics Concern   Not on file  Social History Narrative   Right Handed    Lives in a two story home    Father to 3 girls    Social Determinants of Health   Financial Resource  Strain: Low Risk  (04/06/2022)   Overall Financial Resource Strain (CARDIA)    Difficulty of Paying Living Expenses: Not hard at all  Food Insecurity: No Food Insecurity (04/06/2022)   Hunger Vital Sign    Worried About Running Out of Food in the Last Year: Never true    Ran Out of Food in the Last Year: Never true  Transportation Needs: No Transportation Needs (04/06/2022)   PRAPARE - Administrator, Civil Service (Medical): No    Lack of Transportation (Non-Medical): No  Physical Activity: Insufficiently Active (04/06/2022)   Exercise Vital Sign    Days of Exercise per Week: 2 days    Minutes of Exercise per Session: 40 min  Stress: No Stress Concern Present (04/06/2022)   [employer] of Occupational Health - Occupational Stress Questionnaire    Feeling of Stress : Not at all  Social Connections: Socially Integrated (04/06/2022)   Social Connection and Isolation Panel [NHANES]    Frequency of Communication with Friends and Family: More than three times a week    Frequency of Social Gatherings with Friends and Family: More than three times a week    Attends Religious Services: More than 4 times per year    Active Member of [employer] or Organizations:  Yes    Attends Club or Organization Meetings: More than 4 times per year    Marital Status: Married  Catering manager Violence: Not At Risk (04/06/2022)   Humiliation, Afraid, Rape, and Kick questionnaire    Fear of Current or Ex-Partner: No    Emotionally Abused: No    Physically Abused: No    Sexually Abused: No    Family History  Problem Relation Age of Onset   Dementia Mother    Heart failure Father        Deteriration of the heart   Stroke Brother     Current Outpatient Medications  Medication Sig Dispense Refill   amLODipine (NORVASC) 5 MG tablet Take 1 tablet (5 mg total) by mouth in the morning and at bedtime. 180 tablet 3   aspirin EC 81 MG tablet Take 81 mg by mouth daily.     hydrALAZINE (APRESOLINE) 25 MG tablet  TAKE 1 TABLET BY MOUTH IN THE MORNING AND AT BEDTIME 180 tablet 3   irbesartan (AVAPRO) 150 MG tablet Take 1.5 tablets (225 mg total) by mouth daily. 135 tablet 1   Omega 3-6-9 Fatty Acids (OMEGA 3-6-9 COMPLEX PO) Take by mouth.     rosuvastatin (CRESTOR) 10 MG tablet Take 1 tablet (10 mg total) by mouth daily. 90 tablet 2   sildenafil (VIAGRA) 50 MG tablet Take 0.5 tablets (25 mg total) by mouth daily as needed for erectile dysfunction. 10 tablet 0   vitamin B-12 (CYANOCOBALAMIN) 1000 MCG tablet Take 1,000 mcg by mouth daily.     vitamin C (ASCORBIC ACID) 500 MG tablet Take 500 mg by mouth daily.     metoprolol tartrate (LOPRESSOR) 25 MG tablet Take 0.5 tablets (12.5 mg total) by mouth daily. (Patient not taking: Reported on 07/21/2022) 90 tablet 3   No current facility-administered medications for this visit.    Allergies  Allergen Reactions   Hydrochlorothiazide     Other reaction(s): Other (See Comments) Leg pain      REVIEW OF SYSTEMS:   [X]  denotes positive finding, [ ]  denotes negative finding Cardiac  Comments:  Chest pain or chest pressure:    Shortness of breath upon exertion:    Short of breath when lying flat:    Irregular heart rhythm:        Vascular    Pain in calf, thigh, or hip brought on by ambulation:    Pain in feet at night that wakes you up from your sleep:     Blood clot in your veins:    Leg swelling:         Pulmonary    Oxygen at home:    Productive cough:     Wheezing:         Neurologic    Sudden weakness in arms or legs:     Sudden numbness in arms or legs:     Sudden onset of difficulty speaking or slurred speech:    Temporary loss of vision in one eye:     Problems with dizziness:         Gastrointestinal    Blood in stool:     Vomited blood:         Genitourinary    Burning when urinating:     Blood in urine:        Psychiatric    Major depression:         Hematologic    Bleeding problems:    Problems with blood clotting too  easily:        Skin    Rashes or ulcers:        Constitutional    Fever or chills:      PHYSICAL EXAMINATION:  Vitals:   08/23/22 1027 08/23/22 1030  BP: (!) 158/78 (!) 156/84  Pulse: 78   Resp: 18   Temp: 98.1 F (36.7 C)   TempSrc: Tympanic   SpO2: 97%   Weight: 135 lb (61.2 kg)   Height: 5' 5.5" (1.664 m)     General:  WDWN in NAD; vital signs documented above Gait: Not observed HENT: WNL, normocephalic Pulmonary: normal non-labored breathing , without Rales, rhonchi,  wheezing Cardiac: regular HR Abdomen: soft, NT, no masses Skin: without rashes Vascular Exam/Pulses: palpable DP and radial pulses Extremities: without ischemic changes, without Gangrene , without cellulitis; without open wounds;  Musculoskeletal: no muscle wasting or atrophy  Neurologic: A&O X 3; CN grossly intact Psychiatric:  The pt has normal affect.   Non-Invasive Vascular Imaging:   1 to 39% stenosis  Left CEA widely patent    ASSESSMENT/PLAN:: 82 y.o. male here for follow up for surveillance of carotid artery stenosis  -Carotid duplex demonstrates 1 to 39% stenosis of the right ICA and widely patent left CEA site.  We will repeat carotid duplex in 1 year.  He will continue his aspirin and statin daily.  He will follow-up with his PCP for further workup related to neuropathic discomfort of both feet.  He has 2+ palpable DP pulses bilaterally.  Etiology is unrelated to circulation.  He knows to call/return office sooner with any questions or concerns   Emilie Rutter, PA-C [facility] [phone]  Clinic MD:   Myra Gianotti

## 2022-09-01 ENCOUNTER — Other Ambulatory Visit: Payer: Self-pay | Admitting: Nurse Practitioner

## 2022-09-13 ENCOUNTER — Other Ambulatory Visit: Payer: Self-pay

## 2022-09-13 DIAGNOSIS — I6523 Occlusion and stenosis of bilateral carotid arteries: Secondary | ICD-10-CM

## 2022-09-15 ENCOUNTER — Encounter: Payer: Self-pay | Admitting: Cardiovascular Disease

## 2022-09-15 ENCOUNTER — Ambulatory Visit: Payer: Medicare Other | Attending: Cardiovascular Disease | Admitting: Cardiovascular Disease

## 2022-09-15 VITALS — BP 142/66 | HR 79 | Ht 65.0 in | Wt 137.8 lb

## 2022-09-15 DIAGNOSIS — I471 Supraventricular tachycardia, unspecified: Secondary | ICD-10-CM | POA: Diagnosis not present

## 2022-09-15 DIAGNOSIS — I10 Essential (primary) hypertension: Secondary | ICD-10-CM

## 2022-09-15 DIAGNOSIS — R0989 Other specified symptoms and signs involving the circulatory and respiratory systems: Secondary | ICD-10-CM | POA: Diagnosis not present

## 2022-09-15 DIAGNOSIS — E782 Mixed hyperlipidemia: Secondary | ICD-10-CM | POA: Diagnosis not present

## 2022-09-15 NOTE — Progress Notes (Signed)
09/15/2022 Maura Crandall Leanne Chang   Feb 28, 1940  [id number]  Primary Physician Caudle, Shelton Silvas, FNP Primary Cardiologist: Runell Gess MD Milagros Loll, [place], [place]  HPI:  Jonathan Savage is a 82 y.o.  thin appearing widowed Caucasian male father of 3 daughters, grandfather of 5 grandchildren referred by Dr. Paulino Rily, his PCP, for a brief episode of syncope.  He is retired Music therapist.  I last saw him in the office 01/08/2021.  He was recently relocated from [name] to [place] to be closer to his daughter Camelia Eng.  I just saw him in the office on 06/05/2019.  His cardiac risk factor profile is notable for treated hypertension and untreated hyperlipidemia.  Father did have bypass surgery.  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  He is never had a heart attack.  Denies chest pain or shortness of breath.  He had a recent episode of brief loss of consciousness while trimming bushes in the yard during the hot summer day.  He attributes this to overheating and dehydration.   I obtained carotid Dopplers on him on 06/07/2019 that showed high-grade left ICA stenosis.  He is also has been complaining claudication.  He denies chest pain or shortness of breath.   I referred him to Dr. Myra Gianotti who performed uneventful left carotid endarterectomy on 08/22/2019.  He had a nonischemic Myoview performed for preoperative clearance 06/21/2019.  He did have a vagal episode postoperatively and was seen by Dr. Flora Lipps.  Since I saw him in the office 9 months ago he continues.  He works at the [employer] 2 days a week.  He no longer has syncopal spells.  He denies chest pain or shortness of breath.   Current Meds  Medication Sig   amLODipine (NORVASC) 5 MG tablet Take 1 tablet (5 mg total) by mouth in the morning and at bedtime.   aspirin EC 81 MG tablet Take 81 mg by mouth daily.   hydrALAZINE (APRESOLINE) 25 MG tablet TAKE 1  TABLET BY MOUTH IN THE MORNING AND AT BEDTIME   irbesartan (AVAPRO) 150 MG tablet TAKE 1.5 TABLETS (225 MG TOTAL) BY MOUTH DAILY.   Omega 3-6-9 Fatty Acids (OMEGA 3-6-9 COMPLEX PO) Take by mouth.   rosuvastatin (CRESTOR) 10 MG tablet Take 1 tablet (10 mg total) by mouth daily.   sildenafil (VIAGRA) 50 MG tablet Take 0.5 tablets (25 mg total) by mouth daily as needed for erectile dysfunction.   vitamin B-12 (CYANOCOBALAMIN) 1000 MCG tablet Take 1,000 mcg by mouth daily.   vitamin C (ASCORBIC ACID) 500 MG tablet Take 500 mg by mouth daily.     Allergies  Allergen Reactions   Hydrochlorothiazide     Other reaction(s): Other (See Comments) Leg pain     Social History   Socioeconomic History   Marital status: Widowed    Spouse name: Not on file   Number of children: 3   Years of education: Not on file   Highest education level: Not on file  Occupational History   Not on file  Tobacco Use   Smoking status: Former   Smokeless tobacco: Never  Vaping Use   Vaping status: Never Used  Substance and Sexual Activity   Alcohol use: Never   Drug use: Never   Sexual activity: Not on file  Other Topics Concern   Not on file  Social History Narrative   Right Handed  Lives in a two story home    Father to 3 girls    Social Determinants of Health   Financial Resource Strain: Low Risk  (04/06/2022)   Overall Financial Resource Strain (CARDIA)    Difficulty of Paying Living Expenses: Not hard at all  Food Insecurity: No Food Insecurity (04/06/2022)   Hunger Vital Sign    Worried About Running Out of Food in the Last Year: Never true    Ran Out of Food in the Last Year: Never true  Transportation Needs: No Transportation Needs (04/06/2022)   PRAPARE - Administrator, Civil Service (Medical): No    Lack of Transportation (Non-Medical): No  Physical Activity: Insufficiently Active (04/06/2022)   Exercise Vital Sign    Days of Exercise per Week: 2 days    Minutes of Exercise per  Session: 40 min  Stress: No Stress Concern Present (04/06/2022)   [employer] of Occupational Health - Occupational Stress Questionnaire    Feeling of Stress : Not at all  Social Connections: Socially Integrated (04/06/2022)   Social Connection and Isolation Panel [NHANES]    Frequency of Communication with Friends and Family: More than three times a week    Frequency of Social Gatherings with Friends and Family: More than three times a week    Attends Religious Services: More than 4 times per year    Active Member of [employer] or Organizations: Yes    Attends Engineer, structural: More than 4 times per year    Marital Status: Married  Catering manager Violence: Not At Risk (04/06/2022)   Humiliation, Afraid, Rape, and Kick questionnaire    Fear of Current or Ex-Partner: No    Emotionally Abused: No    Physically Abused: No    Sexually Abused: No     Review of Systems: General: negative for chills, fever, night sweats or weight changes.  Cardiovascular: negative for chest pain, dyspnea on exertion, edema, orthopnea, palpitations, paroxysmal nocturnal dyspnea or shortness of breath Dermatological: negative for rash Respiratory: negative for cough or wheezing Urologic: negative for hematuria Abdominal: negative for nausea, vomiting, diarrhea, bright red blood per rectum, melena, or hematemesis Neurologic: negative for visual changes, syncope, or dizziness All other systems reviewed and are otherwise negative except as noted above.    Blood pressure (!) 142/66, pulse 79, height 5\' 5"  (1.651 m), weight 137 lb 12.8 oz (62.5 kg).  General appearance: alert and no distress Neck: no adenopathy, no JVD, supple, symmetrical, trachea midline, thyroid not enlarged, symmetric, no tenderness/mass/nodules, and soft bilateral carotid bruits Lungs: clear to auscultation bilaterally Heart: Regular rate and rhythm without murmurs gallops rubs or clicks Extremities: extremities normal,  atraumatic, no cyanosis or edema Pulses: 2+ and symmetric Skin: Skin color, texture, turgor normal. No rashes or lesions Neurologic: Grossly normal  EKG EKG Interpretation Date/Time:  Wednesday September 15 2022 10:31:36 EDT Ventricular Rate:  79 PR Interval:  124 QRS Duration:  82 QT Interval:  362 QTC Calculation: 415 R Axis:   43  Text Interpretation: Normal sinus rhythm Septal infarct , age undetermined When compared with ECG of 03-Mar-2022 12:35, PREVIOUS ECG IS PRESENT Confirmed by Nanetta Batty [phone]) on 09/15/2022 11:12:42 AM    ASSESSMENT AND PLAN:   Essential hypertension History of hypertension her blood pressure measured today at 142/66.  He is on hydralazine, amlodipine, Avapro.  Hyperlipidemia History of hyperlipidemia on statin therapy with lipid profile performed 05/03/2022 revealing total cholesterol 130, LDL 64, and HDL of 51.  Carotid bruit History of carotid artery disease status post uneventful elective left carotid endarterectomy performed Dr. Myra Gianotti 08/22/2019.  His most recent carotid Dopplers performed 08/23/2022 revealed a widely patent endarterectomy site.  This will be repeated on an annual basis.     Runell Gess MD FACP,FACC,FAHA, [facility] 09/15/2022 11:24 AM

## 2022-09-15 NOTE — Assessment & Plan Note (Signed)
History of carotid artery disease status post uneventful elective left carotid endarterectomy performed Dr. Myra Gianotti 08/22/2019.  His most recent carotid Dopplers performed 08/23/2022 revealed a widely patent endarterectomy site.  This will be repeated on an annual basis.

## 2022-09-15 NOTE — Patient Instructions (Addendum)
Medication Instructions:  Your physician recommends that you continue on your current medications as directed. Please refer to the Current Medication list given to you today.  *If you need a refill on your cardiac medications before your next appointment, please call your pharmacy*   Testing/Procedures: Your physician has requested that you have a carotid duplex. This test is an ultrasound of the carotid arteries in your neck. It looks at blood flow through these arteries that supply the brain with blood. Allow one hour for this exam. There are no restrictions or special instructions. This will take place at [address], [address]. To do in August 2025.   Follow-Up: At [facility], you and your health needs are our priority.  As part of our continuing mission to provide you with exceptional heart care, we have created designated Provider Care Teams.  These Care Teams include your primary Cardiologist (physician) and Advanced Practice Providers (APPs -  Physician Assistants and Nurse Practitioners) who all work together to provide you with the care you need, when you need it.  We recommend signing up for the patient portal called "MyChart".  Sign up information is provided on this After Visit Summary.  MyChart is used to connect with patients for Virtual Visits (Telemedicine).  Patients are able to view lab/test results, encounter notes, upcoming appointments, etc.  Non-urgent messages can be sent to your provider as well.   To learn more about what you can do with MyChart, go to [email or web address].    Your next appointment:   6 month(s)  Provider:   Bernadene Person, NP      Then, Nanetta Batty, MD will plan to see you again in 12 month(s).

## 2022-09-15 NOTE — Assessment & Plan Note (Signed)
History of hyperlipidemia on statin therapy with lipid profile performed 05/03/2022 revealing total cholesterol 130, LDL 64, and HDL of 51.

## 2022-09-15 NOTE — Assessment & Plan Note (Signed)
History of hypertension her blood pressure measured today at 142/66.  He is on hydralazine, amlodipine, Avapro.

## 2022-09-19 ENCOUNTER — Other Ambulatory Visit: Payer: Self-pay | Admitting: Cardiovascular Disease

## 2022-10-18 ENCOUNTER — Ambulatory Visit (INDEPENDENT_AMBULATORY_CARE_PROVIDER_SITE_OTHER): Payer: Medicare Other | Admitting: Family Medicine

## 2022-10-18 ENCOUNTER — Encounter (HOSPITAL_BASED_OUTPATIENT_CLINIC_OR_DEPARTMENT_OTHER): Payer: Self-pay | Admitting: Family Medicine

## 2022-10-18 VITALS — BP 135/64 | HR 67 | Ht 65.0 in | Wt 139.6 lb

## 2022-10-18 DIAGNOSIS — R350 Frequency of micturition: Secondary | ICD-10-CM | POA: Diagnosis not present

## 2022-10-18 DIAGNOSIS — M545 Low back pain, unspecified: Secondary | ICD-10-CM | POA: Diagnosis not present

## 2022-10-18 DIAGNOSIS — R29898 Other symptoms and signs involving the musculoskeletal system: Secondary | ICD-10-CM | POA: Diagnosis not present

## 2022-10-18 DIAGNOSIS — R200 Anesthesia of skin: Secondary | ICD-10-CM

## 2022-10-18 DIAGNOSIS — R7303 Prediabetes: Secondary | ICD-10-CM

## 2022-10-18 DIAGNOSIS — R202 Paresthesia of skin: Secondary | ICD-10-CM

## 2022-10-18 NOTE — Patient Instructions (Signed)
*  Voltaren gel for musculoskeletal pain *Miralax for constipation

## 2022-10-18 NOTE — Progress Notes (Signed)
Acute Office Visit  Subjective:     Patient ID: Jonathan Savage, male    DOB: Jan 03, 1941, 82 y.o.   MRN: [id number]  Chief Complaint  Patient presents with   Abdominal Pain    Right side, ongoing for awhile started worsening about a week ago keeps him from sleeping. Has not been able to go to the gym   Extremity Weakness    Leg weakness   Jonathan Savage is an 82 year old male patient who presents today for concerns of right lower back pain for a couple of weeks and episodes of weakness in his legs. The pain in his back is causing him not to sleep. Reports that his HTN medication was changed- stopped metoprolol recently. Denies dizziness, reports he gets weak in his legs and feels "numbness and tingling in both legs" and feels like his legs give out.  Denies recent falls, upper extremity numbness/tingling, saddle anesthesia, bowel or bladder incontinence, and abdominal pain. He reports that he just sits down and relaxes when this happens. He reports issues with constipation, despite taking metamucil.    Review of Systems  Constitutional:  Negative for malaise/fatigue.  Eyes:  Negative for blurred vision and double vision.  Respiratory:  Negative for cough and shortness of breath.   Cardiovascular:  Negative for chest pain and palpitations.  Gastrointestinal:  Negative for abdominal pain, nausea and vomiting.  Genitourinary:  Positive for dysuria, frequency and urgency. Negative for flank pain and hematuria.  Musculoskeletal:  Positive for back pain (R sided lower back). Negative for myalgias.  Neurological:  Negative for dizziness, speech change, focal weakness and weakness.  Psychiatric/Behavioral:  Negative for depression and suicidal ideas. The patient is not nervous/anxious and does not have insomnia.        Objective:    BP 135/64   Pulse 67   Ht 5\' 5"  (1.651 m)   Wt 139 lb 9.6 oz (63.3 kg)   SpO2 99%   BMI 23.23 kg/m   Physical Exam Vitals reviewed.   Constitutional:      Appearance: Normal appearance. He is well-developed.  Cardiovascular:     Rate and Rhythm: Normal rate and regular rhythm.     Pulses: Normal pulses.     Heart sounds: Normal heart sounds.  Pulmonary:     Effort: Pulmonary effort is normal.     Breath sounds: Normal breath sounds.  Abdominal:     General: Bowel sounds are normal.     Palpations: Abdomen is soft.     Tenderness: There is no right CVA tenderness or left CVA tenderness.  Musculoskeletal:     Cervical back: Normal.     Thoracic back: Normal.     Lumbar back: Tenderness (R-sided/midline) present. No swelling, edema, signs of trauma or spasms. Normal range of motion.  Neurological:     Mental Status: He is alert.     Cranial Nerves: Cranial nerves 2-12 are intact.  Psychiatric:        Mood and Affect: Mood normal.        Behavior: Behavior normal.        Thought Content: Thought content normal.        Judgment: Judgment normal.    Assessment & Plan:   1. Acute right-sided low back pain without sciatica Patient presents today for concerns about right-sided and mid-lower back pain. Physical exam remarkable for tenderness to palpation in mid/right side of his lower back. No CVA tenderness present. Discussed most likely related to musculoskeletal  strain. Discussed treatment options, patient agreeable to try Voltaren gel and physical therapy. Will avoid oral medication at this time, due to history of kidney disease and hypertension. If conservative treatment does not help improve pain, may be reasonable to obtain imaging.  - Ambulatory referral to Physical Therapy  2. Weakness of both lower extremities Patient presents today for concerns about weakness in lower extremities. Denies his legs giving out. Patient is able to stand from sitting position fluidly. CN 2-12 intact, along with sensation. Motor 5/5- no weakness present in upper or lower extremities. Normal gait. Romberg negative. No incoordination  on finger to nose or heel to shin tests. No dysdiadochokinesia. No acute red flags present on exam. Feel this is related to age and deconditioning. Will refer to physical therapy for strength training.     - Ambulatory referral to Physical Therapy  3. Numbness and tingling of both lower extremities Patient reports numbness/tingling in both of his lower extremities, mostly present in his calves. Denies saddle anesthesia, bowel or bladder incontinence, pain/numbness/tingling radiating from lower back to lower extremities. Will assess vitamin B12 and hemoglobin A1c.   - Vitamin B12 - Hemoglobin A1c  4. Urinary frequency Patient reports he has burning with urination and urinary urgency/frequency. Denies fever/chills, abdominal pain, and CVA tenderness.  - Urinalysis, Routine w reflex microscopic  5. Prediabetes Hemoglobin A1c last assessed on 05/03/2022 with results of 6.0. He would like to reassess his hemoglobin A1c today with additional blood work.  - Hemoglobin A1c   Return if symptoms worsen or fail to improve.  Alyson Reedy, FNP

## 2022-10-19 LAB — URINALYSIS, ROUTINE W REFLEX MICROSCOPIC
Bilirubin, UA: NEGATIVE
Glucose, UA: NEGATIVE
Leukocytes,UA: NEGATIVE
Nitrite, UA: NEGATIVE
Protein,UA: NEGATIVE
RBC, UA: NEGATIVE
Specific Gravity, UA: 1.014 (ref 1.005–1.030)
Urobilinogen, Ur: 0.2 mg/dL (ref 0.2–1.0)
pH, UA: 5.5 (ref 5.0–7.5)

## 2022-10-19 LAB — HEMOGLOBIN A1C
Est. average glucose Bld gHb Est-mCnc: 126 mg/dL
Hgb A1c MFr Bld: 6 % — ABNORMAL HIGH (ref 4.8–5.6)

## 2022-10-19 LAB — VITAMIN B12: Vitamin B-12: 968 pg/mL (ref 232–1245)

## 2022-10-22 ENCOUNTER — Ambulatory Visit (HOSPITAL_BASED_OUTPATIENT_CLINIC_OR_DEPARTMENT_OTHER): Payer: Medicare Other | Admitting: Family Medicine

## 2022-10-25 ENCOUNTER — Ambulatory Visit (HOSPITAL_BASED_OUTPATIENT_CLINIC_OR_DEPARTMENT_OTHER): Payer: Medicare Other | Admitting: Family Medicine

## 2022-10-25 ENCOUNTER — Encounter (HOSPITAL_BASED_OUTPATIENT_CLINIC_OR_DEPARTMENT_OTHER): Payer: Self-pay | Admitting: Family Medicine

## 2022-10-25 VITALS — BP 153/83 | HR 57 | Temp 99.9°F | Ht 65.0 in | Wt 133.4 lb

## 2022-10-25 DIAGNOSIS — R051 Acute cough: Secondary | ICD-10-CM | POA: Diagnosis not present

## 2022-10-25 DIAGNOSIS — U071 COVID-19: Secondary | ICD-10-CM

## 2022-10-25 LAB — POC COVID19 BINAXNOW: SARS Coronavirus 2 Ag: POSITIVE — AB

## 2022-10-25 LAB — POCT INFLUENZA A/B
Influenza A, POC: NEGATIVE
Influenza B, POC: NEGATIVE

## 2022-10-25 MED ORDER — ONDANSETRON HCL 4 MG PO TABS: 4.0000 mg | ORAL_TABLET | Freq: Three times a day (TID) | ORAL | 0 refills | Status: DC | PRN

## 2022-10-25 MED ORDER — MOLNUPIRAVIR EUA 200MG CAPSULE: 4.0000 | ORAL_CAPSULE | Freq: Two times a day (BID) | ORAL | 0 refills | Status: AC

## 2022-10-25 NOTE — Progress Notes (Signed)
Acute Office Visit  Subjective:    Patient ID: Jonathan Savage, male    DOB: 1940-05-09, 82 y.o.   MRN: [id number]  Chief Complaint  Patient presents with   Cough    Ongoing for about 5 days, brown colored mucus with cough. Sore throat prior, not much anymore. Has tried NyQuil   Diarrhea    Unable to eat anything for about 4 days   Brom [place] is an 82 year old male patient who presents today for can acute visit with complaint of cough, sore throat, fatigue, and diarrhea. He reports sputum production, fever/chills. Reports that these symptoms started on Friday 10/22/2022.  Denies shob, wheezing, chest pain, headache, sinus pressure.  Sick contacts : yes  Review of Systems  Constitutional:  Positive for chills, diaphoresis, fever and malaise/fatigue. Negative for weight loss.  Respiratory:  Positive for cough and sputum production. Negative for hemoptysis, shortness of breath and wheezing.   Gastrointestinal:  Positive for diarrhea, nausea and vomiting. Negative for abdominal pain and constipation.  Musculoskeletal:  Positive for myalgias.  Psychiatric/Behavioral:  Negative for depression, memory loss and suicidal ideas. The patient does not have insomnia.      Objective:    BP (!) 153/83   Pulse (!) 57   Temp 99.9 F (37.7 C) (Oral)   Ht 5\' 5"  (1.651 m)   Wt 133 lb 6.4 oz (60.5 kg)   SpO2 94%   BMI 22.20 kg/m   Physical Exam Constitutional:      Appearance: He is ill-appearing. He is not diaphoretic.  Cardiovascular:     Rate and Rhythm: Normal rate and regular rhythm.     Pulses: Normal pulses.     Heart sounds: Normal heart sounds.  Pulmonary:     Effort: Pulmonary effort is normal. No respiratory distress.     Breath sounds: Normal breath sounds.  Neurological:     Mental Status: He is alert.  Psychiatric:        Mood and Affect: Mood normal.        Behavior: Behavior normal.    Assessment & Plan:   1. Acute cough Patient is a pleasant  82 year old male patient who presents for an acute sick visit today. He is ill-appearing and is in a wheelchair at this visit today. No acute shortness of breath, respiratory distress, accessory muscle usage, or retractions. He reports he started to not feel well on Friday 10/22/2022 with fatigue, sore throat, cough with sputum production, fever/chills, and diarrhea. Patient reports that he feels very fatigued and weak. He is unable to keep down food and reports feeling nauseous. POCT COVID-19 positive. Discussed symptomatic management including Coricidin HBP for cough suppression, Mucinex as a cough expectorant, and Tylenol for fever. Discussed emergency precautions with patient and caregiver- including, but not limited to, an increase in weakness, shortness of breath, increase in work of breathing, or chest pain. Prescribed anti-nauseous medication for patient, along with anti-viral. Reviewed current medication with anti-viral and advised patient it is safe to take all of his current medications concurrently. Advised patient to return to office within 72 hours if he does not begin to feel better.  - POC COVID-19 BinaxNow - POCT Influenza A/B - molnupiravir EUA (LAGEVRIO) 200 mg CAPS capsule; Take 4 capsules (800 mg total) by mouth 2 (two) times daily for 5 days.  Dispense: 40 capsule; Refill: 0 - ondansetron (ZOFRAN) 4 MG tablet; Take 1 tablet (4 mg total) by mouth every 8 (eight) hours as needed for nausea  or vomiting.  Dispense: 20 tablet; Refill: 0  2. Lab test positive for detection of COVID-19 virus See #1   Return if symptoms worsen or fail to improve.  Alyson Reedy, FNP

## 2022-10-25 NOTE — Patient Instructions (Signed)
*it is safe to take all of your medications.   COVID-19 positive:  Stay home and away from others until symptoms are improving and fever free for 24 hours without the use of tylenol or ibuprofen. If symptoms have improved and fever resolved, then can be in public setting, but should wear a mask around others, practice safe distancing, and perform hand hygiene for an additional 5 days. If symptoms worsen, or the patient develop shortness of breath, pulse oximeter reading of < 90%, or chest pain, seek immediate care at nearest emergency department or call 911.   Vitamin Regimen:  Vitamin C 500mg  twice daily  Vitamin D 5000 units once daily  Zinc 50-75mg  once daily   Over the counter Medications:  Aspirin 81mg  per day * unless allergic or contraindicated*  Use Tylenol (acetaminophen) for fever *unless allergic or contraindicated*    Non-Medication Therapy:  Drink plenty of fluids, warm if possible.   A teaspoon of honey may help ease coughing symptoms.   Cough drops or hard candy for coughing.   Over the Counter Medication Therapy:  Use a cough expectorant such as guaifenesin (Mucinex) if recommended by your doctor for a wet, congested cough. If you have high blood pressure, please ask your doctor first before using this.   Use a cough suppressant such as dextromethorphan (Robitussin/Delsym) for a dry cough. If you have high blood pressure, please ask your doctor first before using this.   If you have high blood pressure, medication such as Coricidin HBP is safe to take for your cough and will not increase your blood pressure.      MyChart:  For all urgent or time sensitive needs we ask that you please call the office to avoid delays. Our number is [phone]. MyChart is not constantly monitored and due to the large volume of messages a day, replies may take up to 72 business hours.   MyChart Policy: MyChart allows for you to see your visit notes, after visit summary, provider  recommendations, lab and tests results, make an appointment, request refills, and contact your provider or the office for non-urgent questions or concerns. Providers are seeing patients during normal business hours and do not have built in time to review MyChart messages.  We ask that you allow a minimum of 3 business days for responses to [employer]. For this reason, please do not send urgent requests through MyChart. Please call the office at [phone]. New and ongoing conditions may require a visit. We have virtual and in person visit available for your convenience.  Complex MyChart concerns may require a visit. Your provider may request you schedule a virtual or in person visit to ensure we are providing the best care possible. MyChart messages sent after 11:00 AM on Friday will not be received by the provider until Monday morning.    Lab and Test Results: You will receive your lab and test results on MyChart as soon as they are completed and results have been sent by the lab or testing facility. Due to this service, you will receive your results BEFORE your provider.  I review lab and tests results each morning prior to seeing patients. Some results require collaboration with other providers to ensure you are receiving the most appropriate care. For this reason, we ask that you please allow a minimum of 3-5 business days from the time the ALL results have been received for your provider to receive and review lab and test results and contact you about  these.  Most lab and test result comments from the provider will be sent through MyChart. Your provider may recommend changes to the plan of care, follow-up visits, repeat testing, ask questions, or request an office visit to discuss these results. You may reply directly to this message or call the office at [phone] to provide information for the provider or set up an appointment. In some instances, you will be called with test results and  recommendations. Please let us know if this is preferred and we will make note of this in your chart to provide this for you.    If you have not heard a response to your lab or test results in 5 business days from all results returning to MyChart, please call the office to let us know. We ask that you please avoid calling prior to this time unless there is an emergent concern. Due to high call volumes, this can delay the resulting process.   After Hours: For all non-emergency after hours needs, please call the office at [phone] and select the option to reach the on-call provider service. On-call services are shared between multiple [facility] offices and therefore it will not be possible to speak directly with your provider. On-call providers may provide medical advice and recommendations, but are unable to provide refills for maintenance medications.  For all emergency or urgent medical needs after normal business hours, we recommend that you seek care at the closest Urgent Care or Emergency Department to ensure appropriate treatment in a timely manner.  [facility] has a 24 hour emergency room located on the ground floor for your convenience.    Urgent Concerns During the Business Day Providers are seeing patients from 8AM to 5PM, Monday through Thursday, and 8AM to 12PM on Friday with a busy schedule and are most often not able to respond to non-urgent calls until the end of the day or the next business day. If you should have URGENT concerns during the day, please call and speak to the nurse or schedule a same day appointment so that we can address your concern without delay.    Thank you, again, for choosing me as your health care partner. I appreciate your trust and look forward to learning more about you.    Alyson Reedy, FNP-C

## 2022-10-27 ENCOUNTER — Ambulatory Visit: Payer: Medicare Other | Admitting: Physical Therapy

## 2022-10-29 ENCOUNTER — Telehealth (HOSPITAL_BASED_OUTPATIENT_CLINIC_OR_DEPARTMENT_OTHER): Payer: Self-pay | Admitting: Family Medicine

## 2022-10-29 NOTE — Telephone Encounter (Signed)
Called and spoke with pt returning his call. Checked to see how pt was doing after recent covid infection and pt said he was feeling better. Answered pt's questions that he had. Pt will come for scheduled visit with Jon Gills 10/31. Nothing further needed.

## 2022-10-29 NOTE — Telephone Encounter (Signed)
Pt would like a call from CMA

## 2022-11-04 ENCOUNTER — Ambulatory Visit (INDEPENDENT_AMBULATORY_CARE_PROVIDER_SITE_OTHER): Payer: Medicare Other | Admitting: Family Medicine

## 2022-11-04 ENCOUNTER — Encounter (HOSPITAL_BASED_OUTPATIENT_CLINIC_OR_DEPARTMENT_OTHER): Payer: Self-pay | Admitting: Family Medicine

## 2022-11-04 ENCOUNTER — Ambulatory Visit (INDEPENDENT_AMBULATORY_CARE_PROVIDER_SITE_OTHER): Payer: Medicare Other

## 2022-11-04 VITALS — BP 132/70 | HR 66 | Ht 65.0 in | Wt 135.0 lb

## 2022-11-04 DIAGNOSIS — R200 Anesthesia of skin: Secondary | ICD-10-CM

## 2022-11-04 DIAGNOSIS — R202 Paresthesia of skin: Secondary | ICD-10-CM

## 2022-11-04 DIAGNOSIS — R3989 Other symptoms and signs involving the genitourinary system: Secondary | ICD-10-CM | POA: Diagnosis not present

## 2022-11-04 DIAGNOSIS — I10 Essential (primary) hypertension: Secondary | ICD-10-CM

## 2022-11-04 DIAGNOSIS — R7303 Prediabetes: Secondary | ICD-10-CM

## 2022-11-04 LAB — POCT URINALYSIS DIP (CLINITEK)
Bilirubin, UA: NEGATIVE
Blood, UA: NEGATIVE
Glucose, UA: NEGATIVE mg/dL
Ketones, POC UA: NEGATIVE mg/dL
Leukocytes, UA: NEGATIVE
Nitrite, UA: NEGATIVE
POC PROTEIN,UA: 30 — AB
Spec Grav, UA: >=1.03 — AB (ref 1.010–1.025)
Urobilinogen, UA: 0.2 U/dL
pH, UA: 5.5 (ref 5.0–8.0)

## 2022-11-04 MED ORDER — GABAPENTIN 100 MG PO CAPS: 100.0000 mg | ORAL_CAPSULE | Freq: Every evening | ORAL | 3 refills | Status: DC | PRN

## 2022-11-04 NOTE — Progress Notes (Signed)
Hi Wayde,  Your thoracic and lumber spine xrays only show mild degeneration in the lower spine which is normal as we age. I do not see any compression fractures or narrowing that could be causing the numbness/tingling in your extremities. Please try the gabapentin and let me know if it does not help. Thank you [employer]

## 2022-11-04 NOTE — Progress Notes (Signed)
Subjective:   Jonathan Savage [facility] 31-Oct-1940 11/04/2022  Chief Complaint  Patient presents with   Extremity Weakness    Legs and arms feel like they are tingling, burning   Urinary Tract Infection    Burning with urination, cloudy urine    HPI: Jonathan Savage presents today for re-assessment and management of chronic medical conditions.  HYPERTENSION: Jonathan Savage presents for the medical management of hypertension.  Patient's current hypertension medication regimen is: Amlodipine 5mg  BID, Hydralazine 25mg  BID, Irbesartan 225mg  daily Patient is currently taking prescribed medications for HTN.  Patient is  regularly keeping a check on BP at home.  Adhering to low sodium diet: Yes Exercising Regularly: Yes Denies headache, dizziness, CP, SHOB, vision changes.   BP Readings from Last 3 Encounters:  11/04/22 132/70  10/25/22 (!) 153/83  10/18/22 135/64    IMPAIRED FASTING GLUCOSE Jonathan Savage is here for medical management of impaired fasting glucose.  Patient's current IFG medication regimen is: Diet and exercise Adhering to a diabetic diet: Yes Exercising Regularly: Yes Checking Blood Sugars: no Denies polydipsia, polyphagia, polyuria.  Lab Results  Component Value Date   HGBA1C 6.0 (H) 10/18/2022    NUMBNESS IN EXTREMITY:  Patient has noticed intermittent numbness and tingling in upper and lower extremities. He was seen by Alyson Reedy NP in October with workup and referral to PT for strengthening of lower extremity weakness. His A1C is maintained at 6.0 He was seen by Vascular MD in August due to hx of carotid stenosis and PVD and vascular etiology was rule out. Patient denies recent falls or injuries, loss of bowel or bladder control, saddle paresthesia or radiating pain.   URINARY SYMPTOMS Onset: 1-2 weeks. Patient has seen a darker color of his urine in the past 1-2 weeks and would like UTI rule out.   Fever/chills: no Dysuria:  burning Urinary frequency: no Urgency: no Foul odor: no Urinary incontinence: no Hematuria: no Abdominal pain: no Suprapubic pain/pressure: no Flank/low back pain: no Nausea/Vomiting: no  The following portions of the patient's history were reviewed and updated as appropriate: past medical history, past surgical history, family history, social history, allergies, medications, and problem list.   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) 06/08/2019   Essential hypertension 06/05/2019   Hyperlipidemia 06/05/2019   Carotid bruit 06/05/2019   Past Medical History:  Diagnosis Date   Carotid artery occlusion    Complication of anesthesia    pt states following last surgery he was awake for over 24 hours following surgery   Hypertension    Stroke [facility])    Past Surgical History:  Procedure Laterality Date   ENDARTERECTOMY Left 08/22/2019   Procedure: LEFT CAROTID ENDARTERECTOMY WITH BOVIE PATCH ANGIOPLASTY;  Surgeon: Nada Libman, MD;  Location: [facility] OR;  Service: Vascular;  Laterality: Left;   LAPAROSCOPIC NEPHRECTOMY Right    NECK SURGERY     PROSTATE SURGERY     Family History  Problem Relation Age of Onset   Dementia Mother    Heart failure Father  Deteriration of the heart   Stroke Brother    Outpatient Medications Prior to Visit  Medication Sig Dispense Refill   amLODipine (NORVASC) 5 MG tablet TAKE 1 TABLET (5 MG TOTAL) BY MOUTH IN THE MORNING AND AT BEDTIME 180 tablet 3   aspirin EC 81 MG tablet Take 81 mg by mouth daily.     augmented betamethasone  dipropionate (DIPROLENE-AF) 0.05 % cream SMARTSIG:1 Sparingly Topical Twice Daily PRN     hydrALAZINE (APRESOLINE) 25 MG tablet TAKE 1 TABLET BY MOUTH IN THE MORNING AND AT BEDTIME 180 tablet 3   irbesartan (AVAPRO) 150 MG tablet TAKE 1.5 TABLETS (225 MG TOTAL) BY MOUTH DAILY. 135 tablet 2   Omega 3-6-9 Fatty Acids (OMEGA 3-6-9 COMPLEX PO) Take by mouth.     ondansetron (ZOFRAN) 4 MG tablet Take 1 tablet (4 mg total) by mouth every 8 (eight) hours as needed for nausea or vomiting. 20 tablet 0   prednisoLONE acetate (PRED FORTE) 1 % ophthalmic suspension 1 drop.     rosuvastatin (CRESTOR) 10 MG tablet Take 1 tablet (10 mg total) by mouth daily. 90 tablet 2   sildenafil (VIAGRA) 50 MG tablet Take 0.5 tablets (25 mg total) by mouth daily as needed for erectile dysfunction. 10 tablet 0   vitamin B-12 (CYANOCOBALAMIN) 1000 MCG tablet Take 1,000 mcg by mouth daily.     vitamin C (ASCORBIC ACID) 500 MG tablet Take 500 mg by mouth daily.     No facility-administered medications prior to visit.   Allergies  Allergen Reactions   Hydrochlorothiazide     Other reaction(s): Other (See Comments) Leg pain      ROS: A complete ROS was performed with pertinent positives/negatives noted in the HPI. The remainder of the ROS are negative.    Objective:   Today's Vitals   11/04/22 0918 11/04/22 0940  BP: (!) 147/62 132/70  Pulse: 66   SpO2: 99%   Weight: 135 lb (61.2 kg)   Height: 5\' 5"  (1.651 m)     Physical Exam          GENERAL: Well-appearing, in NAD. Well nourished.  SKIN: Pink, warm and dry. No rash, lesion, ulceration, or ecchymoses.  Head: Normocephalic. NECK: Trachea midline. Full ROM w/o pain or tenderness.  RESPIRATORY: Chest wall symmetrical. Respirations even and non-labored. Breath sounds clear to auscultation bilaterally.  CARDIAC: S1, S2 present, regular rate and rhythm without murmur or gallops. Peripheral pulses 2+ bilaterally.  MSK: Muscle tone and strength appropriate for  age. Joints w/o tenderness, redness, or swelling.  EXTREMITIES: Without clubbing, cyanosis, or edema. +2 bilateral pedal pulses. Full ROM and strength in all extremities. No sensation deficit.  NEUROLOGIC: No motor or sensory deficits. Steady, even gait. C2-C12 intact.  PSYCH/MENTAL STATUS: Alert, oriented x 3. Cooperative, appropriate mood and affect.   Health Maintenance Due  Topic Date Due   Zoster Vaccines- Shingrix (1 of 2) Never done      Assessment & Plan:  1. Essential hypertension Stable. Continue current regimen and keep scheduled Cardiology appts.   2. Prediabetes Stable. Discussed risk versus benefit of medication versus diet and exercise. Pt would like to continue with diet and exercise control at this time.   3. Urine discoloration No sign of UTI. Mild protein present. Recommended increasing clear hydration and monitoring protein intake for the next 3-5 days. Will culture urine to rule out bacteria presence.  - POCT URINALYSIS DIP (CLINITEK) - Urine Culture  4. Bilateral numbness and tingling of arms  and legs Will rule out possible nerve involvement from stenosis or compression fracture with spinal xrays given patient's age and possible degeneration.Will trial Gabapentin 100mg  nightly as needed. Discussed safe use and possible side effects of this medication with patient and he verbalized understanding.   - DG Lumbar Spine 2-3 Views; Future - DG Thoracic Spine 2 View; Future   Meds ordered this encounter  Medications   gabapentin (NEURONTIN) 100 MG capsule    Sig: Take 1 capsule (100 mg total) by mouth at bedtime as needed (nerve pain).    Dispense:  30 capsule    Refill:  3    Order Specific Question:   Supervising Provider    Answer:   DE Peru, RAYMOND J [[id number]]   Lab Orders         Urine Culture         POCT URINALYSIS DIP (CLINITEK)      Return in about 4 months (around 03/04/2023) for Follow up HTN, Prediabetes, Neuropathy.   Patient to reach out to  office if new, worrisome, or unresolved symptoms arise or if no improvement in patient's condition. Patient verbalized understanding and is agreeable to treatment plan. All questions answered to patient's satisfaction.   Yolanda Manges, FNP

## 2022-11-06 LAB — URINE CULTURE

## 2022-11-06 NOTE — Progress Notes (Signed)
Urine culture does not show UTI. No abx therapy needed.

## 2022-11-10 ENCOUNTER — Ambulatory Visit: Payer: Medicare Other | Attending: Family Medicine | Admitting: Rehabilitative and Restorative Service Providers"

## 2022-11-10 ENCOUNTER — Other Ambulatory Visit: Payer: Self-pay

## 2022-11-10 ENCOUNTER — Encounter: Payer: Self-pay | Admitting: Rehabilitative and Restorative Service Providers"

## 2022-11-10 DIAGNOSIS — M542 Cervicalgia: Secondary | ICD-10-CM

## 2022-11-10 DIAGNOSIS — M6281 Muscle weakness (generalized): Secondary | ICD-10-CM

## 2022-11-10 DIAGNOSIS — R252 Cramp and spasm: Secondary | ICD-10-CM

## 2022-11-10 DIAGNOSIS — R29898 Other symptoms and signs involving the musculoskeletal system: Secondary | ICD-10-CM | POA: Diagnosis present

## 2022-11-10 DIAGNOSIS — R262 Difficulty in walking, not elsewhere classified: Secondary | ICD-10-CM

## 2022-11-10 DIAGNOSIS — R278 Other lack of coordination: Secondary | ICD-10-CM

## 2022-11-10 DIAGNOSIS — R2689 Other abnormalities of gait and mobility: Secondary | ICD-10-CM

## 2022-11-10 NOTE — Patient Instructions (Signed)
     [facility] Welcome to [facility]! Here you will find all the information you will need regarding your pool therapy. If you have further questions at any time, please call our office at [phone]. After completing your initial evaluation in the [facility], you may be eligible to complete a portion of your therapy in the pool. A typical week of therapy will consist of 1-2 typical physical therapy visits at our [facility] location and an additional session of therapy in the pool located at the [facility] at [address]. [address], [place] [address]. The phone number at the pool site is [phone]. Please call this number if you are running late or need to cancel your appointment.  Aquatic therapy will be offered on Wednesday mornings and Friday afternoons. Each session will last approximately 45 minutes. All scheduling and payments for aquatic therapy sessions, including cancelations, will be done through our [facility] location.  To be eligible for aquatic therapy, these criteria must be met: You must be able to independently change in the locker room and get to the pool deck. A caregiver can come with you to help if needed. There are benches for a caregiver to sit on next to the pool. No one with an open wound is permitted in the pool.  Handicap parking is available in the front and there is a drop off option for even closer accessibility. Please arrive 15 minutes prior to your appointment to prepare for your pool session. You must sign in at the front desk upon your arrival. Please be sure to attend to any toileting needs prior to entering the pool. Locker rooms for changing are available.  There is direct access to the pool deck from the locker room. You can lock your belongings in a locker or bring them with you poolside. Your therapist will greet you on the pool deck. There may be other swimmers in the pool at the  same time but your session is one-on-one with the therapist.   

## 2022-11-10 NOTE — Therapy (Signed)
OUTPATIENT PHYSICAL THERAPY THORACOLUMBAR EVALUATION   Patient Name: Jonathan Savage MRN: [id number] DOB:August 30, 1940, 82 y.o., male Today's Date: 11/10/2022  END OF SESSION:  PT End of Session - 11/10/22 0858     Visit Number 1    Date for PT Re-Evaluation 12/31/22    Authorization Type Medicare    Progress Note Due on Visit 10    PT Start Time 0930    PT Stop Time 1010    PT Time Calculation (min) 40 min    Activity Tolerance Patient tolerated treatment well    Behavior During Therapy [facility] for tasks assessed/performed             Past Medical History:  Diagnosis Date   Carotid artery occlusion    Complication of anesthesia    pt states following last surgery he was awake for over 24 hours following surgery   Hypertension    Stroke [facility])    Past Surgical History:  Procedure Laterality Date   ENDARTERECTOMY Left 08/22/2019   Procedure: LEFT CAROTID ENDARTERECTOMY WITH BOVIE PATCH ANGIOPLASTY;  Surgeon: Nada Libman, MD;  Location: [facility] OR;  Service: Vascular;  Laterality: Left;   LAPAROSCOPIC NEPHRECTOMY Right    NECK SURGERY     PROSTATE SURGERY     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  SUBJECTIVE:                                                                                                                                                                                           SUBJECTIVE STATEMENT: Pt reports that every once in a while, he gets really tired and weak.  States that his legs and arms have been feeling weak  and he needs to sit down.  Patient states that he has been to the cardiologist (Dr Allyson Sabal) yearly for assessment.  Patient states that he has had some occurrences of passing out, but has not done this recently.  Patient was referred to PT.  PERTINENT HISTORY:  Essential Hypertension, Claudication in the peripheral vascular disease, carotid stenosis, pt has one kidney, Hx of syncope, recent COVID on 10/25/2022  PAIN:  Are you having pain? Yes: NPRS scale: 2-10/10 Pain location: right low back and into legs.  Occasionally has cervical pain as well Pain description: throbbing Aggravating factors: lying on right side, prolonged sitting, prolonged driving Relieving factors: standing, movement  PRECAUTIONS: None  RED FLAGS: None   WEIGHT BEARING RESTRICTIONS: No  FALLS:  Has patient fallen in last 6 months? No  LIVING ENVIRONMENT: Lives with: lives alone Lives in: House/apartment Stairs:  one story Has following equipment at home: Grab bars  OCCUPATION: Retired  PLOF: Independent and Leisure: clocks, firearms/knives, cars, woodworking/construction  PATIENT GOALS: To improve strength in legs and arms and have less pain.  NEXT MD VISIT: Jerre Simon, FNP on 03/03/22  OBJECTIVE:  Note: Objective measures were completed at Evaluation unless otherwise noted.  DIAGNOSTIC FINDINGS:  Thoracic Radiograph on  11/04/2022: IMPRESSION: No acute abnormality seen.  Lumbar Radiograph on 11/04/2022: FINDINGS: There is no evidence of lumbar spine fracture. Alignment is normal. Mild degenerative disc disease is noted at L3-4, L4-5 and L5-S1 with anterior osteophyte formation.  CT of cervical spine on 03/03/2022: 1. No evidence of acute fracture to the cervical spine. 2. Dextrocurvature of the cervical spine. 3. Mild grade 1 spondylolisthesis at C2-C3 and C7-T1. 4. Prior C5-C6 ACDF. 5. Cervical spondylosis, as described. 6. 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 Additionally, there is suspected early osseous fusion across the left aspect of the disc space at this level.  PATIENT SURVEYS:  Eval:  LEFS 28 / 80 = 35.0 %  COGNITION: Overall cognitive status: Within functional limits for tasks assessed     SENSATION: Pt reports that he has numbness and tingling going down both legs and feet and occasionally has burning sensation in his arms and legs.  POSTURE: rounded shoulders and forward head  PALPATION: Pt with muscle spasms noted in cervical and lumbar region  LUMBAR ROM:   Eval:  Pt limited at least 25% "I can feel it pulling"  LOWER EXTREMITY ROM:     Eval:  WFL  LOWER/UPPER EXTREMITY MMT:    Eval: Right LE is WFL Left LE is 4/5 Bilat UE strength is WFL  LUMBAR SPECIAL TESTS:  Slump test: Negative  FUNCTIONAL TESTS:  5 times sit to stand: 15.06 sec Timed up and go (TUG): 13.92 sec Single Leg Stance: right 4.01 sec, left 2.03 sec   GAIT: Distance walked: >100 ft Assistive device utilized: None Level of assistance: Complete Independence Comments: Pt reports that he has a walk tolerates about 800 yards before he starts feeling fatigued.  TODAY'S TREATMENT:  DATE: 11/10/2022 Reviewed HEP and discussed possible use of aquatic PT (provided handout for  aquatic PT)    PATIENT EDUCATION:  Education details: Issued HEP Person educated: Patient Education method: Explanation, Demonstration, and Handouts Education comprehension: verbalized understanding  HOME EXERCISE PROGRAM: Access Code: [email or web address] URL: [email or web address] Date: 11/10/2022 Prepared by: Reather Laurence  Exercises - Seated Scapular Retraction  - 1 x daily - 7 x weekly - 2 sets - 10 reps - Sit to Stand  - 1 x daily - 7 x weekly - 2 sets - 5 reps - Standing March with Counter Support  - 1 x daily - 7 x weekly - 2 sets - 10 reps - Standing Hip Abduction with Unilateral Counter Support  - 1 x daily - 7 x weekly - 2 sets - 10 reps - Standing Hip Extension with Unilateral Counter Support  - 1 x daily - 7 x weekly - 2 sets - 10 reps  ASSESSMENT:  CLINICAL IMPRESSION: Patient is an 82 y.o. male who was seen today for physical therapy evaluation and treatment for weakness in bilat LE. Patient is known to this PT from a previous PT episode 2 years ago for cervical pain.  Patient states that he has been having increasing weakness over the past few months and has had some syncopal episodes recently.  Patient presents with LE weakness, decreased balance, increased pain, and decreased activity tolerance/difficulty walking.  Patient would benefit from skilled PT to address his functional impairments and allow him to have increased ability to walk without difficulty.  OBJECTIVE IMPAIRMENTS: decreased activity tolerance, decreased balance, difficulty walking, decreased strength, dizziness, impaired perceived functional ability, increased muscle spasms, and pain.   ACTIVITY LIMITATIONS: lifting, sitting, squatting, stairs, and locomotion level  PARTICIPATION LIMITATIONS: driving and community activity  PERSONAL FACTORS: Past/current experiences, Time since onset of injury/illness/exacerbation, and 3+ comorbidities: recent Covid, OA, PVD  are also affecting patient's functional  outcome.   REHAB POTENTIAL: Good  CLINICAL DECISION MAKING: Evolving/moderate complexity  EVALUATION COMPLEXITY: Moderate   GOALS: Goals reviewed with patient? Yes  SHORT TERM GOALS: Target date: 11/26/2022  Patient will be independent with initial HEP. Baseline: Goal status: INITIAL  2.  Patient will report at least a 25% improvement in symptoms since initial evaluation. Baseline:  Goal status: INITIAL  3.  Patient will participate in 3 or 6 minute walk test to establish a baseline. Baseline:  Goal status: INITIAL   LONG TERM GOALS: Target date: 12/31/2022  Patient will be independent with advanced HEP. Baseline:  Goal status: INITIAL  2.  Patient will increase Lower Extremity Functional Scale to at least 50% to demonstrate improvements in functional mobility. Baseline: 35% Goal status: INITIAL  3.  Patient will report ability to walk at least 10-15 minutes without a recovery period or loss of balance to allow patient to ambulate community distances. Baseline: 600 yards Goal status: INITIAL  4.  Patient will report ability to drive for greater than 1 hour without increased pain. Baseline:  Goal status: INITIAL  5.  Patient will increase UE/LE functional strength to [facility] to allow him to perform desired tasks/hobbies without difficulty. Baseline:  Goal status: INITIAL   PLAN:  PT FREQUENCY: 2x/week  PT DURATION: 8 weeks  PLANNED INTERVENTIONS: 97164- PT Re-evaluation, 97110-Therapeutic exercises, 97530- Therapeutic activity, [id number]- Neuromuscular re-education, 97535- Self Care, [id number]- Manual therapy, [id number]- Gait training, [phone]- Canalith repositioning, [id number]- Aquatic Therapy, 97014- Electrical stimulation (unattended), [id number]- Electrical stimulation (manual), [id number]- Vasopneumatic device, [id number]- Ultrasound, [id number]- Traction (mechanical), [id number]-  Ionotophoresis 4mg /ml Dexamethasone, Patient/Family education, Balance training, Stair training, Taping, Dry Needling, Joint  mobilization, Joint manipulation, Spinal manipulation, Spinal mobilization, Cryotherapy, and Moist heat.  PLAN FOR NEXT SESSION: Assess and progress HEP as indicated, strengthening, DN/Manual therapy, balance   Reather Laurence, PT, DPT 11/10/22, 10:37 AM  [facility] [address] [place], [place] [address] Phone # [phone] Fax [phone]

## 2022-11-12 ENCOUNTER — Ambulatory Visit: Payer: Medicare Other

## 2022-11-12 DIAGNOSIS — R252 Cramp and spasm: Secondary | ICD-10-CM

## 2022-11-12 DIAGNOSIS — R2689 Other abnormalities of gait and mobility: Secondary | ICD-10-CM

## 2022-11-12 DIAGNOSIS — R29898 Other symptoms and signs involving the musculoskeletal system: Secondary | ICD-10-CM | POA: Diagnosis not present

## 2022-11-12 DIAGNOSIS — M542 Cervicalgia: Secondary | ICD-10-CM

## 2022-11-12 DIAGNOSIS — R293 Abnormal posture: Secondary | ICD-10-CM

## 2022-11-12 DIAGNOSIS — M6281 Muscle weakness (generalized): Secondary | ICD-10-CM

## 2022-11-12 DIAGNOSIS — R262 Difficulty in walking, not elsewhere classified: Secondary | ICD-10-CM

## 2022-11-12 NOTE — Therapy (Signed)
OUTPATIENT PHYSICAL THERAPY THORACOLUMBAR EVALUATION   Patient Name: Jonathan Savage MRN: [id number] DOB:10/08/1940, 82 y.o., male Today's Date: 11/12/2022  END OF SESSION:  PT End of Session - 11/12/22 0934     Visit Number 2    Date for PT Re-Evaluation 12/31/22    Authorization Type Medicare    Progress Note Due on Visit 10    PT Start Time 0934    PT Stop Time 1015    PT Time Calculation (min) 41 min    Activity Tolerance Patient tolerated treatment well    Behavior During Therapy [facility] for tasks assessed/performed             Past Medical History:  Diagnosis Date   Carotid artery occlusion    Complication of anesthesia    pt states following last surgery he was awake for over 24 hours following surgery   Hypertension    Stroke [facility])    Past Surgical History:  Procedure Laterality Date   ENDARTERECTOMY Left 08/22/2019   Procedure: LEFT CAROTID ENDARTERECTOMY WITH BOVIE PATCH ANGIOPLASTY;  Surgeon: Nada Libman, MD;  Location: [facility] OR;  Service: Vascular;  Laterality: Left;   LAPAROSCOPIC NEPHRECTOMY Right    NECK SURGERY     PROSTATE SURGERY     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  SUBJECTIVE:                                                                                                                                                                                           SUBJECTIVE STATEMENT: Pt reports that he continues to feel weakness and unsteady in his legs and he has discomfort in his feet.    PERTINENT HISTORY:  Essential  Hypertension, Claudication in the peripheral vascular disease, carotid stenosis, pt has one kidney, Hx of syncope, recent COVID on 10/25/2022  PAIN:  Are you having pain? Yes: NPRS scale: 2-10/10 Pain location: right low back and into legs.  Occasionally has cervical pain as well Pain description: throbbing Aggravating factors: lying on right side, prolonged sitting, prolonged driving Relieving factors: standing, movement  PRECAUTIONS: None  RED FLAGS: None   WEIGHT BEARING RESTRICTIONS: No  FALLS:  Has patient fallen in last 6 months? No  LIVING ENVIRONMENT: Lives with: lives alone Lives in: House/apartment Stairs:  one story Has following equipment at home: Grab bars  OCCUPATION: Retired  PLOF: Independent and Leisure: clocks, firearms/knives, cars, woodworking/construction  PATIENT GOALS: To improve strength in legs and arms and have less pain.  NEXT MD VISIT: Jerre Simon, FNP on 03/03/22  OBJECTIVE:  Note: Objective measures were completed at Evaluation unless otherwise noted.  DIAGNOSTIC FINDINGS:  Thoracic Radiograph on 11/04/2022: IMPRESSION: No acute abnormality seen.  Lumbar Radiograph on 11/04/2022: FINDINGS: There is no evidence of lumbar spine fracture. Alignment is normal. Mild degenerative disc disease is noted at L3-4, L4-5 and L5-S1 with anterior osteophyte formation.  CT of cervical  spine on 03/03/2022: 1. No evidence of acute fracture to the cervical spine. 2. Dextrocurvature of the cervical spine. 3. Mild grade 1 spondylolisthesis at C2-C3 and C7-T1. 4. Prior C5-C6 ACDF. 5. Cervical spondylosis, as described. 6. 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 Additionally, there is suspected early osseous fusion across the left aspect of the disc space at this level.  PATIENT SURVEYS:  Eval:  LEFS 28 / 80 = 35.0 %  COGNITION: Overall cognitive status: Within functional limits for tasks assessed     SENSATION: Pt reports that he has numbness and tingling going down both legs and feet and occasionally has burning sensation in his arms and legs.  POSTURE: rounded shoulders and forward head  PALPATION: Pt with muscle spasms noted in cervical and lumbar region  LUMBAR ROM:   Eval:  Pt limited at least 25% "I can feel it pulling"  LOWER EXTREMITY ROM:     Eval:  WFL  LOWER/UPPER EXTREMITY MMT:    Eval: Right LE is WFL Left LE is 4/5 Bilat UE strength is WFL  LUMBAR SPECIAL TESTS:  Slump test: Negative  FUNCTIONAL TESTS:  5 times sit to stand: 15.06 sec Timed up and go (TUG): 13.92 sec Single Leg Stance: right 4.01 sec, left 2.03 sec   GAIT: Distance walked: >100 ft Assistive device utilized: None Level of assistance: Complete Independence Comments: Pt reports that he has a walk tolerates about 800 yards before he starts feeling fatigued.  TODAY'S TREATMENT:                                                                                                                              DATE: 11/12/2022 Nustep x 5 min level 5 (PT present to discuss status and  progress) Sit to stand x 10  Sit to stand x 10 with 5 lb kb At barre: lunges x 10 each LE holding onto bar with 5 lb kb in opposite hand fwd then lateral  Lateral band walks with blue loop x 3 laps at barre  Leg Press 80 lbs (seat at 6) 2 x 10 UBE x 4 min (2/2) Trigger Point Dry-Needling  Treatment  instructions: Expect mild to moderate muscle soreness. S/S of pneumothorax if dry needled over a lung field, and to seek immediate medical attention should they occur. Patient verbalized understanding of these instructions and education. Patient Consent Given: Yes Education handout provided: Yes Muscles treated: bilateral upper traps Electrical stimulation performed: No Parameters: N/A Treatment response/outcome: Skilled palpation used to identify taut bands and trigger points.  Once identified, dry needling techniques used to treat these areas.  Twitch response ellicited along with palpable elongation of muscle.  Following treatment, patient reported mild soreness but some relief of tension in the upper traps  DATE: 11/10/2022 Reviewed HEP and discussed possible use of aquatic PT (provided handout for aquatic PT)    PATIENT EDUCATION:  Education details: Issued HEP Person educated: Patient Education method: Explanation, Demonstration, and Handouts Education comprehension: verbalized understanding  HOME EXERCISE PROGRAM: Access Code: [email or web address] URL: [email or web address] Date: 11/10/2022 Prepared by: Reather Laurence  Exercises - Seated Scapular Retraction  - 1 x daily - 7 x weekly - 2 sets - 10 reps - Sit to Stand  - 1 x daily - 7 x weekly - 2 sets - 5 reps - Standing March with Counter Support  - 1 x daily - 7 x weekly - 2 sets - 10 reps - Standing Hip Abduction with Unilateral Counter Support  - 1 x daily - 7 x weekly - 2 sets - 10 reps - Standing Hip Extension with Unilateral Counter Support  - 1 x daily - 7 x weekly - 2 sets - 10 reps  ASSESSMENT:  CLINICAL IMPRESSION: Mr Wix was able to tolerate various exercises for LE's with minimal fatigue.  He does seem to struggle a bit with proprioception.  We added DN to the upper traps today.  He had good twitch response in right upper trap.   Patient would benefit from skilled PT to address his functional impairments and  allow him to have increased ability to walk without difficulty.  OBJECTIVE IMPAIRMENTS: decreased activity tolerance, decreased balance, difficulty walking, decreased strength, dizziness, impaired perceived functional ability, increased muscle spasms, and pain.   ACTIVITY LIMITATIONS: lifting, sitting, squatting, stairs, and locomotion level  PARTICIPATION LIMITATIONS: driving and community activity  PERSONAL FACTORS: Past/current experiences, Time since onset of injury/illness/exacerbation, and 3+ comorbidities: recent Covid, OA, PVD  are also affecting patient's functional outcome.   REHAB POTENTIAL: Good  CLINICAL DECISION MAKING: Evolving/moderate complexity  EVALUATION COMPLEXITY: Moderate   GOALS: Goals reviewed with patient? Yes  SHORT TERM GOALS: Target date: 11/26/2022  Patient will be independent with initial HEP. Baseline: Goal status: INITIAL  2.  Patient will report at least a 25% improvement in symptoms since initial evaluation. Baseline:  Goal status: INITIAL  3.  Patient will participate in 3 or 6 minute walk test to establish a baseline. Baseline:  Goal status: INITIAL   LONG TERM GOALS: Target date: 12/31/2022  Patient will be independent with advanced HEP. Baseline:  Goal status: INITIAL  2.  Patient will increase Lower Extremity Functional Scale to at least 50% to demonstrate improvements in functional mobility. Baseline: 35% Goal status:  INITIAL  3.  Patient will report ability to walk at least 10-15 minutes without a recovery period or loss of balance to allow patient to ambulate community distances. Baseline: 600 yards Goal status: INITIAL  4.  Patient will report ability to drive for greater than 1 hour without increased pain. Baseline:  Goal status: INITIAL  5.  Patient will increase UE/LE functional strength to [facility] to allow him to perform desired tasks/hobbies without difficulty. Baseline:  Goal status: INITIAL   PLAN:  PT  FREQUENCY: 2x/week  PT DURATION: 8 weeks  PLANNED INTERVENTIONS: 97164- PT Re-evaluation, 97110-Therapeutic exercises, 97530- Therapeutic activity, 97112- Neuromuscular re-education, 97535- Self Care, 08657- Manual therapy, [id number]- Gait training, [id number]- Canalith repositioning, [id number]- Aquatic Therapy, 97014- Electrical stimulation (unattended), [id number]- Electrical stimulation (manual), [id number]- Vasopneumatic device, [id number]- Ultrasound, [id number]- Traction (mechanical), [id number]- Ionotophoresis 4mg /ml Dexamethasone, Patient/Family education, Balance training, Stair training, Taping, Dry Needling, Joint mobilization, Joint manipulation, Spinal manipulation, Spinal mobilization, Cryotherapy, and Moist heat.  PLAN FOR NEXT SESSION: Assess response to DN #1 and progress HEP as indicated, strengthening, DN/Manual therapy, balance   Halley Kincer B. Myron Lona, PT 11/12/22 11:19 AM [facility] [address], [address] [place], [place] [address] Phone # [phone] Fax [phone]   [facility] [address], [address] [place], [place] [address] Phone # [phone] Fax [phone]

## 2022-11-16 ENCOUNTER — Ambulatory Visit: Payer: Medicare Other

## 2022-11-16 DIAGNOSIS — M6281 Muscle weakness (generalized): Secondary | ICD-10-CM

## 2022-11-16 DIAGNOSIS — R278 Other lack of coordination: Secondary | ICD-10-CM

## 2022-11-16 DIAGNOSIS — R293 Abnormal posture: Secondary | ICD-10-CM

## 2022-11-16 DIAGNOSIS — R2689 Other abnormalities of gait and mobility: Secondary | ICD-10-CM

## 2022-11-16 DIAGNOSIS — R252 Cramp and spasm: Secondary | ICD-10-CM

## 2022-11-16 DIAGNOSIS — R262 Difficulty in walking, not elsewhere classified: Secondary | ICD-10-CM

## 2022-11-16 DIAGNOSIS — M62838 Other muscle spasm: Secondary | ICD-10-CM

## 2022-11-16 DIAGNOSIS — M542 Cervicalgia: Secondary | ICD-10-CM

## 2022-11-16 DIAGNOSIS — R29898 Other symptoms and signs involving the musculoskeletal system: Secondary | ICD-10-CM | POA: Diagnosis not present

## 2022-11-16 NOTE — Therapy (Signed)
OUTPATIENT PHYSICAL THERAPY THORACOLUMBAR TREATMENT   Patient Name: Jonathan Savage MRN: [id number] DOB:07-06-1940, 82 y.o., male Today's Date: 11/16/2022  END OF SESSION:  PT End of Session - 11/16/22 0932     Visit Number 3    Date for PT Re-Evaluation 12/31/22    Authorization Type Medicare    Progress Note Due on Visit 10    PT Start Time 0932    PT Stop Time 1013    PT Time Calculation (min) 41 min    Activity Tolerance Patient tolerated treatment well    Behavior During Therapy [facility] for tasks assessed/performed             Past Medical History:  Diagnosis Date   Carotid artery occlusion    Complication of anesthesia    pt states following last surgery he was awake for over 24 hours following surgery   Hypertension    Stroke [place])    Past Surgical History:  Procedure Laterality Date   ENDARTERECTOMY Left 08/22/2019   Procedure: LEFT CAROTID ENDARTERECTOMY WITH BOVIE PATCH ANGIOPLASTY;  Surgeon: Nada Libman, MD;  Location: [facility] OR;  Service: Vascular;  Laterality: Left;   LAPAROSCOPIC NEPHRECTOMY Right    NECK SURGERY     PROSTATE SURGERY     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  SUBJECTIVE:                                                                                                                                                                                           SUBJECTIVE STATEMENT: Pt reports that he felt relief in his neck and shoulders after the DN.  He c/o some  tightness in his lower right thoracic area.     PERTINENT HISTORY:  Essential Hypertension, Claudication in the peripheral vascular disease, carotid stenosis, pt has one kidney, Hx of syncope, recent COVID on 10/25/2022  PAIN:  Are you having pain? Yes: NPRS scale: 2-10/10 Pain location: right low back and into legs.  Occasionally has cervical pain as well Pain description: throbbing Aggravating factors: lying on right side, prolonged sitting, prolonged driving Relieving factors: standing, movement  PRECAUTIONS: None  RED FLAGS: None   WEIGHT BEARING RESTRICTIONS: No  FALLS:  Has patient fallen in last 6 months? No  LIVING ENVIRONMENT: Lives with: lives alone Lives in: House/apartment Stairs:  one story Has following equipment at home: Grab bars  OCCUPATION: Retired  PLOF: Independent and Leisure: clocks, firearms/knives, cars, woodworking/construction  PATIENT GOALS: To improve strength in legs and arms and have less pain.  NEXT MD VISIT: Jerre Simon, FNP on 03/03/22  OBJECTIVE:  Note: Objective measures were completed at Evaluation unless otherwise noted.  DIAGNOSTIC FINDINGS:  Thoracic Radiograph on 11/04/2022: IMPRESSION: No acute abnormality seen.  Lumbar Radiograph on 11/04/2022: FINDINGS: There is no evidence of lumbar spine fracture. Alignment is normal. Mild degenerative disc disease is noted at  L3-4, L4-5 and L5-S1 with anterior osteophyte formation.  CT of cervical spine on 03/03/2022: 1. No evidence of acute fracture to the cervical spine. 2. Dextrocurvature of the cervical spine. 3. Mild grade 1 spondylolisthesis at C2-C3 and C7-T1. 4. Prior C5-C6 ACDF. 5. Cervical spondylosis, as described. 6. 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 Additionally, there is suspected early osseous fusion across the left aspect of the disc space at this level.  PATIENT SURVEYS:  Eval:  LEFS 28 / 80 = 35.0 %  COGNITION: Overall cognitive status: Within functional limits for tasks assessed     SENSATION: Pt reports that he has numbness and tingling going down both legs and feet and occasionally has burning sensation in his arms and legs.  POSTURE: rounded shoulders and forward head  PALPATION: Pt with muscle spasms noted in cervical and lumbar region  LUMBAR ROM:   Eval:  Pt limited at least 25% "I can feel it pulling"  LOWER EXTREMITY ROM:     Eval:  WFL  LOWER/UPPER EXTREMITY MMT:    Eval: Right LE is WFL Left LE is 4/5 Bilat UE strength is WFL  LUMBAR SPECIAL TESTS:  Slump test: Negative  FUNCTIONAL TESTS:  5 times sit to stand: 15.06 sec Timed up and go (TUG): 13.92 sec Single Leg Stance: right 4.01 sec, left 2.03 sec   GAIT: Distance walked: >100 ft Assistive device utilized: None Level of assistance: Complete Independence Comments: Pt reports that he has a walk tolerates about 800 yards before he starts feeling fatigued.  TODAY'S TREATMENT:  DATE: 11/16/2022 Nustep x 5 min level 5 (PT present to discuss status and progress) Sit to stand x 10  Sit to stand x 10 with 10 lb kb At countertop: lunge to balance pad fwd x 10 each LE and lateral x 10 each LE Seated UE rows with green band x 20 with PT anchoring Seated bilateral ER x 20 with green  band Seated bilateral horizontal abduction with green band x 20 Standing D2 PNF with green band x 20 each UE Trigger Point Dry-Needling  Treatment instructions: Expect mild to moderate muscle soreness. S/S of pneumothorax if dry needled over a lung field, and to seek immediate medical attention should they occur. Patient verbalized understanding of these instructions and education. Patient Consent Given: Yes Education handout provided: Yes Muscles treated: bilateral upper traps, right thoracic area Electrical stimulation performed: No Parameters: N/A Treatment response/outcome: Skilled palpation used to identify taut bands and trigger points.  Once identified, dry needling techniques used to treat these areas.  Twitch response ellicited along with palpable elongation of muscle.  Following treatment, patient reported mild soreness but some relief of tension in the upper traps.  We did not achieve twitch response in thoracic area.  Taut bands persisted but difficult to treat over lung field.     DATE: 11/12/2022 Nustep x 5 min level 5 (PT present to discuss status and progress) Sit to stand x 10  Sit to stand x 10 with 5 lb kb At barre: lunges x 10 each LE holding onto bar with 5 lb kb in opposite hand fwd then lateral  Lateral band walks with blue loop x 3 laps at barre  Leg Press 80 lbs (seat at 6) 2 x 10 UBE x 4 min (2/2) Trigger Point Dry-Needling  Treatment instructions: Expect mild to moderate muscle soreness. S/S of pneumothorax if dry needled over a lung field, and to seek immediate medical attention should they occur. Patient verbalized understanding of these instructions and education. Patient Consent Given: Yes Education handout provided: Yes Muscles treated: bilateral upper traps Electrical stimulation performed: No Parameters: N/A Treatment response/outcome: Skilled palpation used to identify taut bands and trigger points.  Once identified, dry needling techniques used to treat  these areas.  Twitch response ellicited along with palpable elongation of muscle.  Following treatment, patient reported mild soreness but some relief of tension in the upper traps  DATE: 11/10/2022 Reviewed HEP and discussed possible use of aquatic PT (provided handout for aquatic PT)    PATIENT EDUCATION:  Education details: Issued HEP Person educated: Patient Education method: Explanation, Demonstration, and Handouts Education comprehension: verbalized understanding  HOME EXERCISE PROGRAM: Access Code: [email or web address] URL: [email or web address] Date: 11/10/2022 Prepared by: Reather Laurence  Exercises - Seated Scapular Retraction  - 1 x daily - 7 x weekly - 2 sets - 10 reps - Sit to Stand  - 1 x daily - 7 x weekly - 2 sets - 5 reps - Standing March with Counter Support  - 1 x daily - 7 x weekly - 2 sets - 10 reps - Standing Hip Abduction with Unilateral Counter Support  - 1 x daily - 7 x weekly - 2 sets - 10 reps - Standing Hip Extension with Unilateral Counter Support  - 1 x daily - 7 x weekly - 2 sets - 10 reps  ASSESSMENT:  CLINICAL IMPRESSION: Mr Laubscher was able to tolerate additional upper body/postural exercises today.  He continues to struggle a bit with proprioception, having 3 losses of balance during  balance exercises.  We continued DN to the upper traps today and attempted multiple area in the thoracic area.  He had quite reactive twitch responses in both upper traps.   Patient would benefit from skilled PT to address his functional impairments and allow him to have increased ability to walk without difficulty.  OBJECTIVE IMPAIRMENTS: decreased activity tolerance, decreased balance, difficulty walking, decreased strength, dizziness, impaired perceived functional ability, increased muscle spasms, and pain.   ACTIVITY LIMITATIONS: lifting, sitting, squatting, stairs, and locomotion level  PARTICIPATION LIMITATIONS: driving and community activity  PERSONAL FACTORS:  Past/current experiences, Time since onset of injury/illness/exacerbation, and 3+ comorbidities: recent Covid, OA, PVD  are also affecting patient's functional outcome.   REHAB POTENTIAL: Good  CLINICAL DECISION MAKING: Evolving/moderate complexity  EVALUATION COMPLEXITY: Moderate   GOALS: Goals reviewed with patient? Yes  SHORT TERM GOALS: Target date: 11/26/2022  Patient will be independent with initial HEP. Baseline: Goal status: MET  2.  Patient will report at least a 25% improvement in symptoms since initial evaluation. Baseline:  Goal status: INITIAL  3.  Patient will participate in 3 or 6 minute walk test to establish a baseline. Baseline:  Goal status: INITIAL   LONG TERM GOALS: Target date: 12/31/2022  Patient will be independent with advanced HEP. Baseline:  Goal status: INITIAL  2.  Patient will increase Lower Extremity Functional Scale to at least 50% to demonstrate improvements in functional mobility. Baseline: 35% Goal status: INITIAL  3.  Patient will report ability to walk at least 10-15 minutes without a recovery period or loss of balance to allow patient to ambulate community distances. Baseline: 600 yards Goal status: INITIAL  4.  Patient will report ability to drive for greater than 1 hour without increased pain. Baseline:  Goal status: INITIAL  5.  Patient will increase UE/LE functional strength to [facility] to allow him to perform desired tasks/hobbies without difficulty. Baseline:  Goal status: INITIAL   PLAN:  PT FREQUENCY: 2x/week  PT DURATION: 8 weeks  PLANNED INTERVENTIONS: 97164- PT Re-evaluation, 97110-Therapeutic exercises, 97530- Therapeutic activity, 97112- Neuromuscular re-education, 97535- Self Care, 16109- Manual therapy, [id number]- Gait training, [phone]- Canalith repositioning, [id number]- Aquatic Therapy, 97014- Electrical stimulation (unattended), [id number]- Electrical stimulation (manual), [id number]- Vasopneumatic device, [id number]- Ultrasound,  [id number]- Traction (mechanical), [id number]- Ionotophoresis 4mg /ml Dexamethasone, Patient/Family education, Balance training, Stair training, Taping, Dry Needling, Joint mobilization, Joint manipulation, Spinal manipulation, Spinal mobilization, Cryotherapy, and Moist heat.  PLAN FOR NEXT SESSION: Assess response to DN #2 and progress HEP as indicated, strengthening, DN/Manual therapy, balance   Fahd Galea B. Velma Hanna, PT 11/16/22 10:22 AM [facility] [address] [place], [place] [address] Phone # [phone] Fax [phone]

## 2022-11-18 ENCOUNTER — Encounter (HOSPITAL_BASED_OUTPATIENT_CLINIC_OR_DEPARTMENT_OTHER): Payer: Self-pay | Admitting: Family Medicine

## 2022-11-18 ENCOUNTER — Telehealth (HOSPITAL_BASED_OUTPATIENT_CLINIC_OR_DEPARTMENT_OTHER): Payer: Self-pay | Admitting: Family Medicine

## 2022-11-18 NOTE — Telephone Encounter (Signed)
Pt is calling to state that --the Air Condition unit was Leaking FREON --this could be the problem --  Do you want to see him

## 2022-11-18 NOTE — Telephone Encounter (Signed)
Pt left a message and I called the pt back but pt stated he would like to talk to you over the phone regarding freon problem and previous visit summary results

## 2022-11-19 ENCOUNTER — Ambulatory Visit: Payer: Medicare Other | Admitting: Rehabilitative and Restorative Service Providers"

## 2022-11-19 ENCOUNTER — Other Ambulatory Visit: Payer: Self-pay

## 2022-11-19 ENCOUNTER — Encounter: Payer: Self-pay | Admitting: Rehabilitative and Restorative Service Providers"

## 2022-11-19 ENCOUNTER — Encounter (HOSPITAL_BASED_OUTPATIENT_CLINIC_OR_DEPARTMENT_OTHER): Payer: Self-pay | Admitting: Family Medicine

## 2022-11-19 ENCOUNTER — Ambulatory Visit (HOSPITAL_BASED_OUTPATIENT_CLINIC_OR_DEPARTMENT_OTHER): Payer: Medicare Other | Admitting: Family Medicine

## 2022-11-19 VITALS — BP 120/70 | HR 89 | Temp 98.2°F | Ht 65.0 in | Wt 130.9 lb

## 2022-11-19 DIAGNOSIS — R29898 Other symptoms and signs involving the musculoskeletal system: Secondary | ICD-10-CM | POA: Diagnosis not present

## 2022-11-19 DIAGNOSIS — R0989 Other specified symptoms and signs involving the circulatory and respiratory systems: Secondary | ICD-10-CM | POA: Diagnosis not present

## 2022-11-19 DIAGNOSIS — Z7729 Contact with and (suspected ) exposure to other hazardous substances: Secondary | ICD-10-CM

## 2022-11-19 DIAGNOSIS — M6281 Muscle weakness (generalized): Secondary | ICD-10-CM

## 2022-11-19 DIAGNOSIS — R5383 Other fatigue: Secondary | ICD-10-CM | POA: Diagnosis not present

## 2022-11-19 DIAGNOSIS — R2689 Other abnormalities of gait and mobility: Secondary | ICD-10-CM

## 2022-11-19 DIAGNOSIS — R262 Difficulty in walking, not elsewhere classified: Secondary | ICD-10-CM

## 2022-11-19 DIAGNOSIS — R252 Cramp and spasm: Secondary | ICD-10-CM

## 2022-11-19 DIAGNOSIS — M542 Cervicalgia: Secondary | ICD-10-CM

## 2022-11-19 NOTE — Patient Instructions (Signed)
Please schedule the abdominal ultrasound.   If you begin having vomiting, nausea, diarrhea, dizziness, or weakness, go to ER.

## 2022-11-19 NOTE — Progress Notes (Unsigned)
Subjective:   Jonathan Savage 01-02-1941 11/19/2022  Chief Complaint  Patient presents with   Possible freon exposure   Hypertension    HPI: Jonathan Savage presents today for re-assessment and management of chronic medical conditions.  Patient concerned about possible Freon exposure from his [employer] unit in his bedroom. He denies ingestion but states he has been likely been having inhalation ongoing for several months. He did have his unit checked and there is a Freon leak. He is having it faxed. He reports he has been having generalized fatigue, shortness of breath for several months. He states he initially noticed the leak when he began having burning in his eyes and on his skin. This has since resolved. He is currently doing PT weekly for generalized weakness of lower extremities. He was prescribed Gabapentin 100mg  nightly for possible numbness/neuropathy to lower extremities. Pt was not able to tolerate Gabapentin due to dizziness in the morning and is not taking currently.    Denies abdominal pain, nausea, vomiting, diarrhea or constipation. He has noticed mild weight loss and has begun drinking Boost to improve weight and protein.   Wt Readings from Last 3 Encounters:  11/19/22 130 lb 14.4 oz (59.4 kg)  11/04/22 135 lb (61.2 kg)  10/25/22 133 lb 6.4 oz (60.5 kg)    The following portions of the patient's history were reviewed and updated as appropriate: past medical history, past surgical history, family history, social history, allergies, medications, and problem list.   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) 06/08/2019   Essential hypertension 06/05/2019   Hyperlipidemia 06/05/2019   Carotid bruit 06/05/2019   Past Medical History:  Diagnosis Date   Carotid artery occlusion    Complication of anesthesia    pt states following last surgery he was awake for over 24 hours following surgery   Hypertension    Stroke [facility])    Past Surgical History:  Procedure Laterality Date   ENDARTERECTOMY Left 08/22/2019   Procedure: LEFT CAROTID ENDARTERECTOMY WITH BOVIE PATCH ANGIOPLASTY;  Surgeon: Nada Libman, MD;  Location: [facility] OR;  Service: Vascular;  Laterality: Left;   LAPAROSCOPIC NEPHRECTOMY Right    NECK SURGERY     PROSTATE SURGERY     Family History  Problem Relation Age of Onset   Dementia Mother    Heart failure Father        Deteriration of the heart   Stroke Brother    Outpatient Medications Prior to Visit  Medication Sig Dispense Refill   amLODipine (NORVASC) 5 MG tablet TAKE 1 TABLET (5 MG TOTAL) BY MOUTH IN THE MORNING AND AT BEDTIME 180 tablet 3   aspirin EC 81 MG tablet Take 81 mg by mouth daily.     augmented betamethasone dipropionate (DIPROLENE-AF) 0.05 % cream SMARTSIG:1 Sparingly Topical Twice Daily PRN     hydrALAZINE (APRESOLINE) 25 MG tablet TAKE 1 TABLET BY MOUTH IN THE MORNING AND AT BEDTIME 180 tablet 3   irbesartan (AVAPRO) 150 MG tablet TAKE 1.5 TABLETS (225 MG TOTAL) BY MOUTH DAILY. 135 tablet 2   Omega  3-6-9 Fatty Acids (OMEGA 3-6-9 COMPLEX PO) Take by mouth.     prednisoLONE acetate (PRED FORTE) 1 % ophthalmic suspension 1 drop.     rosuvastatin (CRESTOR) 10 MG tablet Take 1 tablet (10 mg total) by mouth daily. 90 tablet 2   sildenafil (VIAGRA) 50 MG tablet Take 0.5 tablets (25 mg total) by mouth daily as needed for erectile dysfunction. 10 tablet 0   vitamin B-12 (CYANOCOBALAMIN) 1000 MCG tablet Take 1,000 mcg by mouth daily.     vitamin C (ASCORBIC ACID) 500 MG tablet Take 500 mg by mouth daily.      gabapentin (NEURONTIN) 100 MG capsule Take 1 capsule (100 mg total) by mouth at bedtime as needed (nerve pain). (Patient not taking: Reported on 11/10/2022) 30 capsule 3   ondansetron (ZOFRAN) 4 MG tablet Take 1 tablet (4 mg total) by mouth every 8 (eight) hours as needed for nausea or vomiting. (Patient not taking: Reported on 11/10/2022) 20 tablet 0   No facility-administered medications prior to visit.   Allergies  Allergen Reactions   Hydrochlorothiazide     Other reaction(s): Other (See Comments) Leg pain      ROS: A complete ROS was performed with pertinent positives/negatives noted in the HPI. The remainder of the ROS are negative.    Objective:   Today's Vitals   11/19/22 1113  BP: 120/70  Pulse: 89  Temp: 98.2 F (36.8 C)  TempSrc: Oral  SpO2: 99%  Weight: 130 lb 14.4 oz (59.4 kg)  Height: 5\' 5"  (1.651 m)  PainSc: 4   PainLoc: Neck    Physical Exam          GENERAL: Well-appearing, in NAD. Well nourished.  SKIN: Pink, warm and dry. No rash, lesion, ulceration, or ecchymoses.  Head: Normocephalic. NECK: Trachea midline. Full ROM w/o pain or tenderness. No lymphadenopathy.  EARS: Tympanic membranes are intact, translucent without bulging and without drainage. Appropriate landmarks visualized.  EYES: Conjunctiva clear without exudates. EOMI, PERRL, no drainage present.  THROAT: Uvula midline. Oropharynx clear. Mucous membranes pink and moist.  RESPIRATORY: Chest wall symmetrical. Respirations even and non-labored. Breath sounds clear to auscultation bilaterally.  CARDIAC: S1, S2 present, regular rate and rhythm without murmur or gallops. Peripheral pulses 2+ bilaterally.  GI: Abdomen soft, non-tender. Normoactive bowel sounds. + Abdominal aortic bruit and palpable pulsation with palpable small mass. No thrill.  No rebound tenderness. No hepatomegaly or splenomegaly. No CVA tenderness.  MSK: Muscle tone and strength appropriate for age. Joints w/o tenderness,  redness, or swelling.  EXTREMITIES: Without clubbing, cyanosis, or edema.  NEUROLOGIC: No motor or sensory deficits. Steady, even gait. C2-C12 intact.  PSYCH/MENTAL STATUS: Alert, oriented x 3. Cooperative, appropriate mood and affect.   No results found for any visits on 11/19/22.  The ASCVD Risk score (Arnett DK, et al., 2019) failed to calculate for the following reasons:   The 2019 ASCVD risk score is only valid for ages 38 to 4   The patient has a prior MI or stroke diagnosis  EKG: {ekg findings:315101}.     Assessment & Plan:  1. Other fatigue *** - Comprehensive metabolic panel - CBC with Differential/Platelet - VAS [place] AAA DUPLEX; Future  2. Abdominal bruit *** - VAS [place] AAA DUPLEX; Future  3. Exposure to potentially hazardous substance Patient previously had recent lab work done on 10/31 and urinalysis was normal.  Will obtain CBC and CMP per up-to-date recommendations for possible Freon exposure.  Will also obtain EKG to rule out  any arrhythmias due to possible exposure.   No orders of the defined types were placed in this encounter.  Lab Orders         Comprehensive metabolic panel         CBC with Differential/Platelet     No images are attached to the encounter or orders placed in the encounter.  Return for Follow up as scheduled in February 2024 .    Patient to reach out to office if new, worrisome, or unresolved symptoms arise or if no improvement in patient's condition. Patient verbalized understanding and is agreeable to treatment plan. All questions answered to patient's satisfaction.    Yolanda Manges, FNP

## 2022-11-19 NOTE — Therapy (Signed)
OUTPATIENT PHYSICAL THERAPY TREATMENT NOTE   Patient Name: Jonathan Savage MRN: [id number] DOB:Jul 14, 1940, 82 y.o., male Today's Date: 11/19/2022  END OF SESSION:  PT End of Session - 11/19/22 0940     Visit Number 4    Date for PT Re-Evaluation 12/31/22    Authorization Type Medicare    Progress Note Due on Visit 10    PT Start Time 0930    PT Stop Time 1010    PT Time Calculation (min) 40 min    Activity Tolerance Patient tolerated treatment well    Behavior During Therapy [facility] for tasks assessed/performed             Past Medical History:  Diagnosis Date   Carotid artery occlusion    Complication of anesthesia    pt states following last surgery he was awake for over 24 hours following surgery   Hypertension    Stroke [facility])    Past Surgical History:  Procedure Laterality Date   ENDARTERECTOMY Left 08/22/2019   Procedure: LEFT CAROTID ENDARTERECTOMY WITH BOVIE PATCH ANGIOPLASTY;  Surgeon: Nada Libman, MD;  Location: [facility] OR;  Service: Vascular;  Laterality: Left;   LAPAROSCOPIC NEPHRECTOMY Right    NECK SURGERY     PROSTATE SURGERY     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  SUBJECTIVE:                                                                                                                                                                                           SUBJECTIVE STATEMENT: Pt reports that the dry needling seems to be helping. States that he realized that his [facility] unit was leaking freon and he may have been exposed to that.  States that he is going to the MD to be checked out.  PERTINENT HISTORY:  Essential Hypertension, Claudication in the peripheral vascular disease, carotid stenosis, pt has one kidney, Hx of syncope, recent COVID on 10/25/2022  PAIN:  Are you having pain? Yes: NPRS scale: 5/10 Pain location: right low back and into legs.  Occasionally has cervical pain as well Pain description: throbbing Aggravating factors: lying on right side, prolonged sitting, prolonged driving Relieving factors: standing, movement  PRECAUTIONS: None  RED FLAGS: None   WEIGHT BEARING RESTRICTIONS: No  FALLS:  Has patient fallen in last 6 months? No  LIVING ENVIRONMENT: Lives with: lives alone Lives in: House/apartment Stairs:  one story Has following equipment at home: Grab bars  OCCUPATION: Retired  PLOF: Independent and Leisure: clocks, firearms/knives, cars, woodworking/construction  PATIENT GOALS: To improve strength in legs and arms and have less pain.  NEXT MD VISIT: Jerre Simon, FNP on 03/03/22  OBJECTIVE:  Note: Objective measures were completed at Evaluation unless otherwise noted.  DIAGNOSTIC FINDINGS:  Thoracic Radiograph on 11/04/2022: IMPRESSION: No acute abnormality seen.  Lumbar Radiograph on 11/04/2022: FINDINGS: There is no evidence of lumbar spine fracture. Alignment is normal. Mild degenerative disc disease is noted at  L3-4, L4-5 and L5-S1 with anterior osteophyte formation.  CT of cervical spine on 03/03/2022: 1. No evidence of acute fracture to the cervical spine. 2. Dextrocurvature of the cervical spine. 3. Mild grade 1 spondylolisthesis at C2-C3 and C7-T1. 4. Prior C5-C6 ACDF. 5. Cervical spondylosis, as described. 6. 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 Additionally, there is suspected early osseous fusion across the left aspect of the disc space at this level.  PATIENT SURVEYS:  Eval:  LEFS 28 / 80 = 35.0 %  COGNITION: Overall cognitive status: Within functional limits for tasks assessed     SENSATION: Pt reports that he has numbness and tingling going down both legs and feet and occasionally has burning sensation in his arms and legs.  POSTURE: rounded shoulders and forward head  PALPATION: Pt with muscle spasms noted in cervical and lumbar region  LUMBAR ROM:   Eval:  Pt limited at least 25% "I can feel it pulling"  LOWER EXTREMITY ROM:     Eval:  WFL  LOWER/UPPER EXTREMITY MMT:    Eval: Right LE is WFL Left LE is 4/5 Bilat UE strength is WFL  LUMBAR SPECIAL TESTS:  Slump test: Negative  FUNCTIONAL TESTS:  Eval: 5 times sit to stand: 15.06 sec Timed up and go (TUG): 13.92 sec Single Leg Stance: right 4.01 sec, left 2.03 sec   GAIT: Distance walked: >100 ft Assistive device utilized: None Level of assistance: Complete Independence Comments: Pt reports that he has a walk tolerates about 800 yards before he starts feeling fatigued.  TODAY'S TREATMENT:  DATE:  11/19/2022 Nustep level 5 x6 min with PT present to discuss status Sit to/from stand holding 10# kettlebell 2x10 Seated core series with 10# kettlebell:  hip to hip, hip to alt shoulder.  X10 each Seated rows with green tband 2x10 Seated shoulder ER and horizontal abduction with green  tband 2x10 each bilat (with cuing to slow down to improve muscle activation) Standing shoulder D2 PNF with green tband 2x10 bilat Seated side bend over peanut ball with overhead reach x20 bilat Leg Press (seat at 6) 80# 2x10 Standing at barre with blue loop around ankles:  hip abduction and hip extension.  2x10 each bilat Standing rocker board DF/PF x2 min   11/16/2022 Nustep x 5 min level 5 (PT present to discuss status and progress) Sit to stand x 10  Sit to stand x 10 with 10 lb kb At countertop: lunge to balance pad fwd x 10 each LE and lateral x 10 each LE Seated UE rows with green band x 20 with PT anchoring Seated bilateral ER x 20 with green band Seated bilateral horizontal abduction with green band x 20 Standing D2 PNF with green band x 20 each UE Trigger Point Dry-Needling  Treatment instructions: Expect mild to moderate muscle soreness. S/S of pneumothorax if dry needled over a lung field, and to seek immediate medical attention should they occur. Patient verbalized understanding of these instructions and education. Patient Consent Given: Yes Education handout provided: Yes Muscles treated: bilateral upper traps, right thoracic area Electrical stimulation performed: No Parameters: N/A Treatment response/outcome: Skilled palpation used to identify taut bands and trigger points.  Once identified, dry needling techniques used to treat these areas.  Twitch response ellicited along with palpable elongation of muscle.  Following treatment, patient reported mild soreness but some relief of tension in the upper traps.  We did not achieve twitch response in thoracic area.  Taut bands persisted but difficult to treat over lung field.     DATE: 11/12/2022 Nustep x 5 min level 5 (PT present to discuss status and progress) Sit to stand x 10  Sit to stand x 10 with 5 lb kb At barre: lunges x 10 each LE holding onto bar with 5 lb kb in opposite hand fwd then lateral  Lateral band walks  with blue loop x 3 laps at barre  Leg Press 80 lbs (seat at 6) 2 x 10 UBE x 4 min (2/2) Trigger Point Dry-Needling  Treatment instructions: Expect mild to moderate muscle soreness. S/S of pneumothorax if dry needled over a lung field, and to seek immediate medical attention should they occur. Patient verbalized understanding of these instructions and education. Patient Consent Given: Yes Education handout provided: Yes Muscles treated: bilateral upper traps Electrical stimulation performed: No Parameters: N/A Treatment response/outcome: Skilled palpation used to identify taut bands and trigger points.  Once identified, dry needling techniques used to treat these areas.  Twitch response ellicited along with palpable elongation of muscle.  Following treatment, patient reported mild soreness but some relief of tension in the upper traps   PATIENT EDUCATION:  Education details: Issued HEP Person educated: Patient Education method: Explanation, Demonstration, and Handouts Education comprehension: verbalized understanding  HOME EXERCISE PROGRAM: Access Code: [email or web address] URL: [email or web address] Date: 11/10/2022 Prepared by: Reather Laurence  Exercises - Seated Scapular Retraction  - 1 x daily - 7 x weekly - 2 sets - 10 reps - Sit to Stand  - 1 x daily - 7 x weekly - 2 sets - 5  reps - Standing March with Counter Support  - 1 x daily - 7 x weekly - 2 sets - 10 reps - Standing Hip Abduction with Unilateral Counter Support  - 1 x daily - 7 x weekly - 2 sets - 10 reps - Standing Hip Extension with Unilateral Counter Support  - 1 x daily - 7 x weekly - 2 sets - 10 reps  ASSESSMENT:  CLINICAL IMPRESSION: Mr Dofflemyer presents to skilled PT reporting that the dry needling does seem to be helping.  Patient is considering aquatic PT, but wants to go tour the pool first to decide.  Patient able to progress with exercises with just minimal cuing for decreased speed and technique.  Patient with  some complaints of muscle fatigue following exercises.  Patient continues to require skilled PT to progress towards goal related activities.  OBJECTIVE IMPAIRMENTS: decreased activity tolerance, decreased balance, difficulty walking, decreased strength, dizziness, impaired perceived functional ability, increased muscle spasms, and pain.   ACTIVITY LIMITATIONS: lifting, sitting, squatting, stairs, and locomotion level  PARTICIPATION LIMITATIONS: driving and community activity  PERSONAL FACTORS: Past/current experiences, Time since onset of injury/illness/exacerbation, and 3+ comorbidities: recent Covid, OA, PVD  are also affecting patient's functional outcome.   REHAB POTENTIAL: Good  CLINICAL DECISION MAKING: Evolving/moderate complexity  EVALUATION COMPLEXITY: Moderate   GOALS: Goals reviewed with patient? Yes  SHORT TERM GOALS: Target date: 11/26/2022  Patient will be independent with initial HEP. Baseline: Goal status: MET  2.  Patient will report at least a 25% improvement in symptoms since initial evaluation. Baseline:  Goal status: INITIAL  3.  Patient will participate in 3 or 6 minute walk test to establish a baseline. Baseline:  Goal status: INITIAL   LONG TERM GOALS: Target date: 12/31/2022  Patient will be independent with advanced HEP. Baseline:  Goal status: INITIAL  2.  Patient will increase Lower Extremity Functional Scale to at least 50% to demonstrate improvements in functional mobility. Baseline: 35% Goal status: INITIAL  3.  Patient will report ability to walk at least 10-15 minutes without a recovery period or loss of balance to allow patient to ambulate community distances. Baseline: 600 yards Goal status: INITIAL  4.  Patient will report ability to drive for greater than 1 hour without increased pain. Baseline:  Goal status: INITIAL  5.  Patient will increase UE/LE functional strength to [facility] to allow him to perform desired tasks/hobbies  without difficulty. Baseline:  Goal status: INITIAL   PLAN:  PT FREQUENCY: 2x/week  PT DURATION: 8 weeks  PLANNED INTERVENTIONS: 97164- PT Re-evaluation, 97110-Therapeutic exercises, 97530- Therapeutic activity, 97112- Neuromuscular re-education, 97535- Self Care, 86578- Manual therapy, [id number]- Gait training, [phone]- Canalith repositioning, [id number]- Aquatic Therapy, 97014- Electrical stimulation (unattended), [id number]- Electrical stimulation (manual), [id number]- Vasopneumatic device, [id number]- Ultrasound, [id number]- Traction (mechanical), [id number]- Ionotophoresis 4mg /ml Dexamethasone, Patient/Family education, Balance training, Stair training, Taping, Dry Needling, Joint mobilization, Joint manipulation, Spinal manipulation, Spinal mobilization, Cryotherapy, and Moist heat.  PLAN FOR NEXT SESSION: Assess and progress HEP as indicated, strengthening, DN/Manual therapy as indicated, balance, 6 minute walk test   Reather Laurence, PT, DPT 11/19/22, 10:18 AM  [facility] [address] [place], [place] [address] Phone # [phone] Fax [phone]

## 2022-11-20 LAB — CBC WITH DIFFERENTIAL/PLATELET
Basophils Absolute: 0.1 10*3/uL (ref 0.0–0.2)
Basos: 1 %
EOS (ABSOLUTE): 0.1 10*3/uL (ref 0.0–0.4)
Eos: 2 %
Hematocrit: 39.2 % (ref 37.5–51.0)
Hemoglobin: 12.5 g/dL — ABNORMAL LOW (ref 13.0–17.7)
Immature Grans (Abs): 0 10*3/uL (ref 0.0–0.1)
Immature Granulocytes: 0 %
Lymphocytes Absolute: 0.9 10*3/uL (ref 0.7–3.1)
Lymphs: 16 %
MCH: 29.2 pg (ref 26.6–33.0)
MCHC: 31.9 g/dL (ref 31.5–35.7)
MCV: 92 fL (ref 79–97)
Monocytes Absolute: 0.9 10*3/uL (ref 0.1–0.9)
Monocytes: 16 %
Neutrophils Absolute: 3.8 10*3/uL (ref 1.4–7.0)
Neutrophils: 65 %
Platelets: 237 10*3/uL (ref 150–450)
RBC: 4.28 x10E6/uL (ref 4.14–5.80)
RDW: 13.2 % (ref 11.6–15.4)
WBC: 5.8 10*3/uL (ref 3.4–10.8)

## 2022-11-20 LAB — COMPREHENSIVE METABOLIC PANEL
ALT: 17 [IU]/L (ref 0–44)
AST: 22 [IU]/L (ref 0–40)
Albumin: 4.2 g/dL (ref 3.7–4.7)
Alkaline Phosphatase: 76 [IU]/L (ref 44–121)
BUN/Creatinine Ratio: 13 (ref 10–24)
BUN: 18 mg/dL (ref 8–27)
Bilirubin Total: 0.4 mg/dL (ref 0.0–1.2)
CO2: 25 mmol/L (ref 20–29)
Calcium: 10 mg/dL (ref 8.6–10.2)
Chloride: 98 mmol/L (ref 96–106)
Creatinine, Ser: 1.36 mg/dL — ABNORMAL HIGH (ref 0.76–1.27)
Globulin, Total: 2.7 g/dL (ref 1.5–4.5)
Glucose: 117 mg/dL — ABNORMAL HIGH (ref 70–99)
Potassium: 4.7 mmol/L (ref 3.5–5.2)
Sodium: 137 mmol/L (ref 134–144)
Total Protein: 6.9 g/dL (ref 6.0–8.5)
eGFR: 52 mL/min/{1.73_m2} — ABNORMAL LOW (ref >59)

## 2022-11-22 ENCOUNTER — Ambulatory Visit: Payer: Medicare Other | Admitting: Rehabilitative and Restorative Service Providers"

## 2022-11-22 ENCOUNTER — Telehealth: Payer: Self-pay | Admitting: Rehabilitative and Restorative Service Providers"

## 2022-11-22 NOTE — Progress Notes (Signed)
Hi Dhruva,  Your lab work does not look bad. Your kidney function (creatinine and eGFR is stable from prior months. Your hemoglobin is only slightly lower than normal. Please continue to stay well hydrated, eat an iron rich diet and we will continue with your Ultrasound as well. If you have questions, please let me know.

## 2022-11-22 NOTE — Telephone Encounter (Signed)
Called patient secondary to missed visit on 11/22/2022.  He apologized stating that he thought his appointments were on Tuesdays/Fridays.  Reviewed next scheduled appointment with patient and he verbalized his understanding.

## 2022-11-23 NOTE — Telephone Encounter (Signed)
That patient had an appt on 11-19-22

## 2022-11-24 ENCOUNTER — Encounter: Payer: Medicare Other | Admitting: Rehabilitative and Restorative Service Providers"

## 2022-11-25 ENCOUNTER — Encounter (HOSPITAL_BASED_OUTPATIENT_CLINIC_OR_DEPARTMENT_OTHER): Payer: Self-pay | Admitting: Family Medicine

## 2022-11-29 ENCOUNTER — Ambulatory Visit: Payer: Medicare Other | Admitting: Rehabilitative and Restorative Service Providers"

## 2022-11-29 ENCOUNTER — Encounter: Payer: Self-pay | Admitting: Rehabilitative and Restorative Service Providers"

## 2022-11-29 DIAGNOSIS — R278 Other lack of coordination: Secondary | ICD-10-CM

## 2022-11-29 DIAGNOSIS — R2689 Other abnormalities of gait and mobility: Secondary | ICD-10-CM

## 2022-11-29 DIAGNOSIS — R29898 Other symptoms and signs involving the musculoskeletal system: Secondary | ICD-10-CM | POA: Diagnosis not present

## 2022-11-29 DIAGNOSIS — M6281 Muscle weakness (generalized): Secondary | ICD-10-CM

## 2022-11-29 DIAGNOSIS — R252 Cramp and spasm: Secondary | ICD-10-CM

## 2022-11-29 DIAGNOSIS — R262 Difficulty in walking, not elsewhere classified: Secondary | ICD-10-CM

## 2022-11-29 DIAGNOSIS — M542 Cervicalgia: Secondary | ICD-10-CM

## 2022-11-29 NOTE — Therapy (Signed)
OUTPATIENT PHYSICAL THERAPY TREATMENT NOTE   Patient Name: Jonathan Savage MRN: [id number] DOB:Sep 12, 1940, 82 y.o., male Today's Date: 11/29/2022  END OF SESSION:  PT End of Session - 11/29/22 1110     Visit Number 5    Date for PT Re-Evaluation 12/31/22    Authorization Type Medicare    Progress Note Due on Visit 10    PT Start Time 1102    PT Stop Time 1145    PT Time Calculation (min) 43 min    Activity Tolerance Patient tolerated treatment well    Behavior During Therapy WFL for tasks assessed/performed             Past Medical History:  Diagnosis Date   Carotid artery occlusion    Complication of anesthesia    pt states following last surgery he was awake for over 24 hours following surgery   Hypertension    Stroke [facility])    Past Surgical History:  Procedure Laterality Date   ENDARTERECTOMY Left 08/22/2019   Procedure: LEFT CAROTID ENDARTERECTOMY WITH BOVIE PATCH ANGIOPLASTY;  Surgeon: Nada Libman, MD;  Location: [facility] OR;  Service: Vascular;  Laterality: Left;   LAPAROSCOPIC NEPHRECTOMY Right    NECK SURGERY     PROSTATE SURGERY     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  SUBJECTIVE:                                                                                                                                                                                           SUBJECTIVE STATEMENT: Pt reports that he is feeling at least 30% better since starting PT.  PERTINENT HISTORY:  Essential Hypertension, Claudication in the peripheral vascular disease,  carotid stenosis, pt has one kidney, Hx of syncope, recent COVID on 10/25/2022  PAIN:  Are you having pain? Yes: NPRS scale: 4/10 Pain location: right low back and into legs.  Occasionally has cervical pain as well Pain description: throbbing Aggravating factors: lying on right side, prolonged sitting, prolonged driving Relieving factors: standing, movement  PRECAUTIONS: None  RED FLAGS: None   WEIGHT BEARING RESTRICTIONS: No  FALLS:  Has patient fallen in last 6 months? No  LIVING ENVIRONMENT: Lives with: lives alone Lives in: House/apartment Stairs:  one story Has following equipment at home: Grab bars  OCCUPATION: Retired  PLOF: Independent and Leisure: clocks, firearms/knives, cars, woodworking/construction  PATIENT GOALS: To improve strength in legs and arms and have less pain.  NEXT MD VISIT: Jerre Simon, FNP on 03/03/22  OBJECTIVE:  Note: Objective measures were completed at Evaluation unless otherwise noted.  DIAGNOSTIC FINDINGS:  Thoracic Radiograph on 11/04/2022: IMPRESSION: No acute abnormality seen.  Lumbar Radiograph on 11/04/2022: FINDINGS: There is no evidence of lumbar spine fracture. Alignment is normal. Mild degenerative disc disease is noted at L3-4, L4-5 and L5-S1 with anterior osteophyte formation.  CT of cervical spine on 03/03/2022: 1. No  evidence of acute fracture to the cervical spine. 2. Dextrocurvature of the cervical spine. 3. Mild grade 1 spondylolisthesis at C2-C3 and C7-T1. 4. Prior C5-C6 ACDF. 5. Cervical spondylosis, as described. 6. 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 Additionally, there is suspected early osseous fusion across the left aspect of the disc space at this level.  PATIENT SURVEYS:  Eval:  LEFS 28 / 80 = 35.0 %  COGNITION: Overall cognitive status: Within functional limits for tasks assessed     SENSATION: Pt reports that he has numbness and tingling going down both legs and feet and occasionally has burning sensation in his arms and legs.  POSTURE: rounded shoulders and forward head  PALPATION: Pt with muscle spasms noted in cervical and lumbar region  LUMBAR ROM:   Eval:  Pt limited at least 25% "I can feel it pulling"  LOWER EXTREMITY ROM:     Eval:  WFL  LOWER/UPPER EXTREMITY MMT:    Eval: Right LE is WFL Left LE is 4/5 Bilat UE strength is WFL  LUMBAR SPECIAL TESTS:  Slump test: Negative  FUNCTIONAL TESTS:  Eval: 5 times sit to stand: 15.06 sec Timed up and go (TUG): 13.92 sec Single Leg Stance: right 4.01 sec, left 2.03 sec   11/29/2022: 6 minute walk:  921 ft with some reports of increased pain  GAIT: Distance walked: >100 ft Assistive device utilized: None Level of assistance: Complete Independence Comments: Pt reports that he has a walk tolerates about 800 yards before he starts feeling fatigued.  TODAY'S TREATMENT:                                                                                                                               DATE:  11/29/2022 Nustep level  5 x6 min with PT present to discuss status 6 minute walk:  921 ft with some reports of increased pain Sit to/from stand holding 10# kettlebell 2x10 Seated core series with 10# kettlebell:  hip to hip, hip to alt shoulder.  X10 each Seated rows and with green tband 2x10 Standing shoulder  extension with green tband 2x10 Standing shoulder ER and horizontal abduction with green tband 2x10 each bilat (with cuing to slow down to improve muscle activation) Trigger Point Dry-Needling  Treatment instructions: Expect mild to moderate muscle soreness. S/S of pneumothorax if dry needled over a lung field, and to seek immediate medical attention should they occur. Patient verbalized understanding of these instructions and education. Patient Consent Given: Yes Education handout provided: Previously provided Muscles treated: bilat upper traps, bilat thoracic multifid, bilat rhomboids, bilat lats Electrical stimulation performed: No Parameters: N/A Treatment response/outcome: Utilized skilled palpation to identify bony landmarks and trigger points.  Able to illicit twitch response and muscle elongation.  Soft tissue mobilization following to further promote tissue elongation.      11/19/2022 Nustep level 5 x6 min with PT present to discuss status Sit to/from stand holding 10# kettlebell 2x10 Seated core series with 10# kettlebell:  hip to hip, hip to alt shoulder.  X10 each Seated rows with green tband 2x10 Seated shoulder ER and horizontal abduction with green tband 2x10 each bilat (with cuing to slow down to improve muscle activation) Standing shoulder D2 PNF with green tband 2x10 bilat Seated side bend over peanut ball with overhead reach x20 bilat Leg Press (seat at 6) 80# 2x10 Standing at barre with blue loop around ankles:  hip abduction and hip extension.  2x10 each bilat Standing rocker board DF/PF x2 min   11/16/2022 Nustep x 5 min level 5 (PT present to discuss status and progress) Sit to stand x 10  Sit to stand x 10 with 10 lb kb At countertop: lunge to balance pad fwd x 10 each LE and lateral x 10 each LE Seated UE rows with green band x 20 with PT anchoring Seated bilateral ER x 20 with green band Seated bilateral horizontal abduction with green band x 20 Standing D2  PNF with green band x 20 each UE Trigger Point Dry-Needling  Treatment instructions: Expect mild to moderate muscle soreness. S/S of pneumothorax if dry needled over a lung field, and to seek immediate medical attention should they occur. Patient verbalized understanding of these instructions and education. Patient Consent Given: Yes Education handout provided: Yes Muscles treated: bilateral upper traps, right thoracic area Electrical stimulation performed: No Parameters: N/A Treatment response/outcome: Skilled palpation used to identify taut bands and trigger points.  Once identified, dry needling techniques used to treat these areas.  Twitch response ellicited along with palpable elongation of muscle.  Following treatment, patient reported mild soreness but some relief of tension in the upper traps.  We did not achieve twitch response in thoracic area.  Taut bands persisted but difficult to treat over lung field.      PATIENT EDUCATION:  Education details: Issued HEP Person educated: Patient Education method: Explanation, Facilities manager, and Handouts Education comprehension: verbalized understanding  HOME EXERCISE PROGRAM: Access Code: [email or web address] URL: [email or web address] Date: 11/10/2022 Prepared by: Reather Laurence  Exercises - Seated Scapular Retraction  - 1 x daily - 7 x weekly - 2 sets - 10 reps - Sit to Stand  - 1 x daily - 7 x weekly - 2 sets - 5 reps - Standing March with Counter  Support  - 1 x daily - 7 x weekly - 2 sets - 10 reps - Standing Hip Abduction with Unilateral Counter Support  - 1 x daily - 7 x weekly - 2 sets - 10 reps - Standing Hip Extension with Unilateral Counter Support  - 1 x daily - 7 x weekly - 2 sets - 10 reps  ASSESSMENT:  CLINICAL IMPRESSION: Mr Macko presents to skilled PT reporting that he feels approximately 30% better since starting PT.  Patient able to participate in 6 minute walk today with some increased reports of pain at end of  ambulation.  Patient with good twitch response noted during dry needling.  Proceeded with manual soft tissue mobilization and patient reported decreased pain and decreased tightness as a result.  Patient continues to require skilled PT to progress towards goal related activities.  OBJECTIVE IMPAIRMENTS: decreased activity tolerance, decreased balance, difficulty walking, decreased strength, dizziness, impaired perceived functional ability, increased muscle spasms, and pain.   ACTIVITY LIMITATIONS: lifting, sitting, squatting, stairs, and locomotion level  PARTICIPATION LIMITATIONS: driving and community activity  PERSONAL FACTORS: Past/current experiences, Time since onset of injury/illness/exacerbation, and 3+ comorbidities: recent Covid, OA, PVD  are also affecting patient's functional outcome.   REHAB POTENTIAL: Good  CLINICAL DECISION MAKING: Evolving/moderate complexity  EVALUATION COMPLEXITY: Moderate   GOALS: Goals reviewed with patient? Yes  SHORT TERM GOALS: Target date: 11/26/2022  Patient will be independent with initial HEP. Baseline: Goal status: MET  2.  Patient will report at least a 25% improvement in symptoms since initial evaluation. Baseline:  Goal status: Met on 11/29/2022  3.  Patient will participate in 3 or 6 minute walk test to establish a baseline. Baseline:  Goal status: Met on 11/29/2022   LONG TERM GOALS: Target date: 12/31/2022  Patient will be independent with advanced HEP. Baseline:  Goal status: Ongoing  2.  Patient will increase Lower Extremity Functional Scale to at least 50% to demonstrate improvements in functional mobility. Baseline: 35% Goal status: INITIAL  3.  Patient will report ability to walk at least 10-15 minutes without a recovery period or loss of balance to allow patient to ambulate community distances. Baseline: 600 yards Goal status: INITIAL  4.  Patient will report ability to drive for greater than 1 hour without  increased pain. Baseline:  Goal status: INITIAL  5.  Patient will increase UE/LE functional strength to [facility] to allow him to perform desired tasks/hobbies without difficulty. Baseline:  Goal status: INITIAL   PLAN:  PT FREQUENCY: 2x/week  PT DURATION: 8 weeks  PLANNED INTERVENTIONS: 97164- PT Re-evaluation, 97110-Therapeutic exercises, 97530- Therapeutic activity, 97112- Neuromuscular re-education, 97535- Self Care, 21308- Manual therapy, [id number]- Gait training, [phone]- Canalith repositioning, [id number]- Aquatic Therapy, 97014- Electrical stimulation (unattended), [id number]- Electrical stimulation (manual), [id number]- Vasopneumatic device, [id number]- Ultrasound, [id number]- Traction (mechanical), [id number]- Ionotophoresis 4mg /ml Dexamethasone, Patient/Family education, Balance training, Stair training, Taping, Dry Needling, Joint mobilization, Joint manipulation, Spinal manipulation, Spinal mobilization, Cryotherapy, and Moist heat.  PLAN FOR NEXT SESSION: Assess and progress HEP as indicated, strengthening, DN/Manual therapy as indicated   Reather Laurence, PT, DPT 11/29/22, 12:00 PM  [facility] [address] [place], [place] [address] Phone # [phone] Fax [phone]

## 2022-12-06 ENCOUNTER — Encounter: Payer: Self-pay | Admitting: Rehabilitative and Restorative Service Providers"

## 2022-12-06 ENCOUNTER — Ambulatory Visit: Payer: Medicare Other | Attending: Family Medicine | Admitting: Rehabilitative and Restorative Service Providers"

## 2022-12-06 DIAGNOSIS — R2689 Other abnormalities of gait and mobility: Secondary | ICD-10-CM | POA: Diagnosis present

## 2022-12-06 DIAGNOSIS — R262 Difficulty in walking, not elsewhere classified: Secondary | ICD-10-CM | POA: Insufficient documentation

## 2022-12-06 DIAGNOSIS — M6281 Muscle weakness (generalized): Secondary | ICD-10-CM | POA: Diagnosis present

## 2022-12-06 DIAGNOSIS — R278 Other lack of coordination: Secondary | ICD-10-CM | POA: Insufficient documentation

## 2022-12-06 DIAGNOSIS — R252 Cramp and spasm: Secondary | ICD-10-CM | POA: Diagnosis present

## 2022-12-06 DIAGNOSIS — M542 Cervicalgia: Secondary | ICD-10-CM | POA: Insufficient documentation

## 2022-12-06 NOTE — Therapy (Signed)
OUTPATIENT PHYSICAL THERAPY TREATMENT NOTE   Patient Name: Jonathan Savage MRN: [id number] DOB:1940/03/04, 82 y.o., male Today's Date: 12/06/2022  END OF SESSION:  PT End of Session - 12/06/22 1018     Visit Number 6    Date for PT Re-Evaluation 12/31/22    Authorization Type Medicare    Progress Note Due on Visit 10    PT Start Time 1014    PT Stop Time 1055    PT Time Calculation (min) 41 min    Activity Tolerance Patient tolerated treatment well    Behavior During Therapy WFL for tasks assessed/performed             Past Medical History:  Diagnosis Date   Carotid artery occlusion    Complication of anesthesia    pt states following last surgery he was awake for over 24 hours following surgery   Hypertension    Stroke [facility])    Past Surgical History:  Procedure Laterality Date   ENDARTERECTOMY Left 08/22/2019   Procedure: LEFT CAROTID ENDARTERECTOMY WITH BOVIE PATCH ANGIOPLASTY;  Surgeon: Nada Libman, MD;  Location: [facility] OR;  Service: Vascular;  Laterality: Left;   LAPAROSCOPIC NEPHRECTOMY Right    NECK SURGERY     PROSTATE SURGERY     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  SUBJECTIVE:                                                                                                                                                                                           SUBJECTIVE STATEMENT: Pt reports that he had some soreness after last PT session.  States pain is still a 4/10.  PERTINENT HISTORY:  Essential Hypertension, Claudication in the peripheral vascular disease,  carotid stenosis, pt has one kidney, Hx of syncope, recent COVID on 10/25/2022  PAIN:  Are you having pain? Yes: NPRS scale: 4/10 Pain location: right low back and into legs.  Occasionally has cervical pain as well Pain description: throbbing Aggravating factors: lying on right side, prolonged sitting, prolonged driving Relieving factors: standing, movement  PRECAUTIONS: None  RED FLAGS: None   WEIGHT BEARING RESTRICTIONS: No  FALLS:  Has patient fallen in last 6 months? No  LIVING ENVIRONMENT: Lives with: lives alone Lives in: House/apartment Stairs:  one story Has following equipment at home: Grab bars  OCCUPATION: Retired  PLOF: Independent and Leisure: clocks, firearms/knives, cars, woodworking/construction  PATIENT GOALS: To improve strength in legs and arms and have less pain.  NEXT MD VISIT: Jerre Simon, FNP on 03/03/22  OBJECTIVE:  Note: Objective measures were completed at Evaluation unless otherwise noted.  DIAGNOSTIC FINDINGS:  Thoracic Radiograph on 11/04/2022: IMPRESSION: No acute abnormality seen.  Lumbar Radiograph on 11/04/2022: FINDINGS: There is no evidence of lumbar spine fracture. Alignment is normal. Mild degenerative disc disease is noted at L3-4, L4-5 and L5-S1 with anterior osteophyte formation.  CT of cervical spine on 03/03/2022: 1. No evidence of  acute fracture to the cervical spine. 2. Dextrocurvature of the cervical spine. 3. Mild grade 1 spondylolisthesis at C2-C3 and C7-T1. 4. Prior C5-C6 ACDF. 5. Cervical spondylosis, as described. 6. 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 Additionally, there is suspected early osseous fusion across the left aspect of the disc space at this level.  PATIENT SURVEYS:  Eval:  LEFS 28 / 80 = 35.0 %  COGNITION: Overall cognitive status: Within functional limits for tasks assessed     SENSATION: Pt reports that he has numbness and tingling going down both legs and feet and occasionally has burning sensation in his arms and legs.  POSTURE: rounded shoulders and forward head  PALPATION: Pt with muscle spasms noted in cervical and lumbar region  LUMBAR ROM:   Eval:  Pt limited at least 25% "I can feel it pulling"  LOWER EXTREMITY ROM:     Eval:  WFL  LOWER/UPPER EXTREMITY MMT:    Eval: Right LE is WFL Left LE is 4/5 Bilat UE strength is WFL  LUMBAR SPECIAL TESTS:  Slump test: Negative  FUNCTIONAL TESTS:  Eval: 5 times sit to stand: 15.06 sec Timed up and go (TUG): 13.92 sec Single Leg Stance: right 4.01 sec, left 2.03 sec   11/29/2022: 6 minute walk:  921 ft with some reports of increased pain  GAIT: Distance walked: >100 ft Assistive device utilized: None Level of assistance: Complete Independence Comments: Pt reports that he has a walk tolerates about 800 yards before he starts feeling fatigued.  TODAY'S TREATMENT:                                                                                                                               DATE:  12/06/2022 Nustep level  5 x6 min with PT present to discuss status Seated shoulder ER and horizontal abduction with green tband 2x10 each bilat (with cuing to slow down to improve muscle activation) Seated shoulder D2 with green tband 2x10 bilat Seated rows and with green tband 2x10 Standing shoulder extension with green tband  2x10 Sit to/from stand holding 10# kettlebell 2x10 Seated core series with 10# kettlebell:  hip to hip, hip to alt shoulder.  X10 each Seated side bend over peanut ball with overhead reach 2x10 bilat Leg Press (seat at 6) 80# 2x10 Standing at barre with blue loop around ankles:  hip abduction and hip extension.  2x10 each bilat Standing lat pulldown 40# 2x10   11/29/2022 Nustep level 5 x6 min with PT present to discuss status 6 minute walk:  921 ft with some reports of increased pain Sit to/from stand holding 10# kettlebell 2x10 Seated core series with 10# kettlebell:  hip to hip, hip to alt shoulder.  X10 each Seated rows and with green tband 2x10 Standing shoulder extension with green tband 2x10 Standing shoulder ER and horizontal abduction with green tband 2x10 each bilat (with cuing to slow down to improve muscle activation) Trigger Point Dry-Needling  Treatment instructions: Expect mild to moderate muscle soreness. S/S of pneumothorax if dry needled over a lung field, and to seek immediate medical attention should they occur. Patient verbalized understanding of these instructions and education. Patient Consent Given: Yes Education handout provided: Previously provided Muscles treated: bilat upper traps, bilat thoracic multifid, bilat rhomboids, bilat lats Electrical stimulation performed: No Parameters: N/A Treatment response/outcome: Utilized skilled palpation to identify bony landmarks and trigger points.  Able to illicit twitch response and muscle elongation.  Soft tissue mobilization following to further promote tissue elongation.      11/19/2022 Nustep level 5 x6 min with PT present to discuss status Sit to/from stand holding 10# kettlebell 2x10 Seated core series with 10# kettlebell:  hip to hip, hip to alt shoulder.  X10 each Seated rows with green tband 2x10 Seated shoulder ER and horizontal abduction with green tband 2x10 each bilat (with cuing to slow down to improve  muscle activation) Standing shoulder D2 PNF with green tband 2x10 bilat Seated side bend over peanut ball with overhead reach x20 bilat Leg Press (seat at 6) 80# 2x10 Standing at barre with blue loop around ankles:  hip abduction and hip extension.  2x10 each bilat Standing rocker board DF/PF x2 min     PATIENT EDUCATION:  Education details: Issued HEP Person educated: Patient Education method: Explanation, Demonstration, and Handouts Education comprehension: verbalized understanding  HOME EXERCISE PROGRAM: Access Code: [email or web address] URL: [email or web address] Date: 11/10/2022 Prepared by: Reather Laurence  Exercises - Seated Scapular Retraction  - 1 x daily - 7 x weekly - 2 sets - 10 reps - Sit to Stand  - 1 x daily - 7 x weekly - 2 sets - 5 reps - Standing March with Counter Support  - 1 x daily - 7 x weekly - 2 sets - 10 reps - Standing Hip Abduction with Unilateral Counter Support  - 1 x daily - 7 x weekly - 2 sets - 10 reps - Standing Hip Extension with Unilateral Counter Support  - 1 x daily - 7 x weekly - 2 sets - 10 reps  ASSESSMENT:  CLINICAL IMPRESSION: Mr Bronkema presents to skilled PT reporting some increased soreness after last visit.  Pt states that he may have been too aggressive with the theraband exercises and "choked  up" too much on the band.  Patient with good participation during therapeutic exercises.  Able to add in standing lat pulldowns today.  Patient continues to require skilled PT to progress towards goal related activities.  OBJECTIVE IMPAIRMENTS: decreased activity tolerance, decreased balance, difficulty walking, decreased strength, dizziness, impaired perceived functional ability, increased muscle spasms, and pain.   ACTIVITY LIMITATIONS: lifting, sitting, squatting, stairs, and locomotion level  PARTICIPATION LIMITATIONS: driving and community activity  PERSONAL FACTORS: Past/current experiences, Time since onset of  injury/illness/exacerbation, and 3+ comorbidities: recent Covid, OA, PVD  are also affecting patient's functional outcome.   REHAB POTENTIAL: Good  CLINICAL DECISION MAKING: Evolving/moderate complexity  EVALUATION COMPLEXITY: Moderate   GOALS: Goals reviewed with patient? Yes  SHORT TERM GOALS: Target date: 11/26/2022  Patient will be independent with initial HEP. Baseline: Goal status: MET  2.  Patient will report at least a 25% improvement in symptoms since initial evaluation. Baseline:  Goal status: Met on 11/29/2022  3.  Patient will participate in 3 or 6 minute walk test to establish a baseline. Baseline:  Goal status: Met on 11/29/2022   LONG TERM GOALS: Target date: 12/31/2022  Patient will be independent with advanced HEP. Baseline:  Goal status: Ongoing  2.  Patient will increase Lower Extremity Functional Scale to at least 50% to demonstrate improvements in functional mobility. Baseline: 35% Goal status: INITIAL  3.  Patient will report ability to walk at least 10-15 minutes without a recovery period or loss of balance to allow patient to ambulate community distances. Baseline: 600 yards Goal status: INITIAL  4.  Patient will report ability to drive for greater than 1 hour without increased pain. Baseline:  Goal status: INITIAL  5.  Patient will increase UE/LE functional strength to [facility] to allow him to perform desired tasks/hobbies without difficulty. Baseline:  Goal status: INITIAL   PLAN:  PT FREQUENCY: 2x/week  PT DURATION: 8 weeks  PLANNED INTERVENTIONS: 97164- PT Re-evaluation, 97110-Therapeutic exercises, 97530- Therapeutic activity, 97112- Neuromuscular re-education, 97535- Self Care, 45409- Manual therapy, [id number]- Gait training, [phone]- Canalith repositioning, [id number]- Aquatic Therapy, 97014- Electrical stimulation (unattended), [id number]- Electrical stimulation (manual), [id number]- Vasopneumatic device, [id number]- Ultrasound, [id number]- Traction (mechanical),  [id number]- Ionotophoresis 4mg /ml Dexamethasone, Patient/Family education, Balance training, Stair training, Taping, Dry Needling, Joint mobilization, Joint manipulation, Spinal manipulation, Spinal mobilization, Cryotherapy, and Moist heat.  PLAN FOR NEXT SESSION: Assess and progress HEP as indicated, strengthening, DN/Manual therapy as indicated   Reather Laurence, PT, DPT 12/06/22, 11:24 AM  [facility] [address] [place], [place] [address] Phone # [phone] Fax [phone]

## 2022-12-07 ENCOUNTER — Ambulatory Visit (HOSPITAL_COMMUNITY)
Admission: RE | Admit: 2022-12-07 | Discharge: 2022-12-07 | Disposition: A | Discharge status: Home / Self Care | Payer: Medicare Other | Source: Ambulatory Visit | Attending: Vascular Surgery | Admitting: Vascular Surgery

## 2022-12-07 DIAGNOSIS — R5383 Other fatigue: Secondary | ICD-10-CM | POA: Insufficient documentation

## 2022-12-07 DIAGNOSIS — R0989 Other specified symptoms and signs involving the circulatory and respiratory systems: Secondary | ICD-10-CM | POA: Diagnosis present

## 2022-12-08 ENCOUNTER — Ambulatory Visit: Payer: Medicare Other | Admitting: Rehabilitative and Restorative Service Providers"

## 2022-12-08 ENCOUNTER — Encounter: Payer: Self-pay | Admitting: Rehabilitative and Restorative Service Providers"

## 2022-12-08 DIAGNOSIS — R278 Other lack of coordination: Secondary | ICD-10-CM

## 2022-12-08 DIAGNOSIS — M542 Cervicalgia: Secondary | ICD-10-CM

## 2022-12-08 DIAGNOSIS — M6281 Muscle weakness (generalized): Secondary | ICD-10-CM

## 2022-12-08 DIAGNOSIS — R252 Cramp and spasm: Secondary | ICD-10-CM

## 2022-12-08 DIAGNOSIS — R2689 Other abnormalities of gait and mobility: Secondary | ICD-10-CM

## 2022-12-08 DIAGNOSIS — R262 Difficulty in walking, not elsewhere classified: Secondary | ICD-10-CM

## 2022-12-08 NOTE — Progress Notes (Signed)
Hi Jonathan Savage,  Your abdominal ultrasound is normal. There was no sign of dilation or aneurysm present to the abdominal aorta. We will continue to monitor this area. If you have further questions, please let me know.

## 2022-12-08 NOTE — Therapy (Signed)
OUTPATIENT PHYSICAL THERAPY TREATMENT NOTE   Patient Name: Jonathan Savage MRN: [id number] DOB:1940/08/03, 82 y.o., male Today's Date: 12/08/2022  END OF SESSION:  PT End of Session - 12/08/22 1022     Visit Number 7    Date for PT Re-Evaluation 12/31/22    Authorization Type Medicare    Progress Note Due on Visit 10    PT Start Time 1018    PT Stop Time 1100    PT Time Calculation (min) 42 min    Activity Tolerance Patient tolerated treatment well    Behavior During Therapy WFL for tasks assessed/performed             Past Medical History:  Diagnosis Date   Carotid artery occlusion    Complication of anesthesia    pt states following last surgery he was awake for over 24 hours following surgery   Hypertension    Stroke [facility])    Past Surgical History:  Procedure Laterality Date   ENDARTERECTOMY Left 08/22/2019   Procedure: LEFT CAROTID ENDARTERECTOMY WITH BOVIE PATCH ANGIOPLASTY;  Surgeon: Nada Libman, MD;  Location: [facility] OR;  Service: Vascular;  Laterality: Left;   LAPAROSCOPIC NEPHRECTOMY Right    NECK SURGERY     PROSTATE SURGERY     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  SUBJECTIVE:                                                                                                                                                                                           SUBJECTIVE STATEMENT: Pt reports that his pain is less today.    PERTINENT HISTORY:  Essential Hypertension, Claudication in the peripheral vascular disease, carotid stenosis, pt  has one kidney, Hx of syncope, recent COVID on 10/25/2022  PAIN:  Are you having pain? Yes: NPRS scale: 2/10 Pain location: right low back and into legs.  Occasionally has cervical pain as well Pain description: throbbing Aggravating factors: lying on right side, prolonged sitting, prolonged driving Relieving factors: standing, movement  PRECAUTIONS: None  RED FLAGS: None   WEIGHT BEARING RESTRICTIONS: No  FALLS:  Has patient fallen in last 6 months? No  LIVING ENVIRONMENT: Lives with: lives alone Lives in: House/apartment Stairs:  one story Has following equipment at home: Grab bars  OCCUPATION: Retired  PLOF: Independent and Leisure: clocks, firearms/knives, cars, woodworking/construction  PATIENT GOALS: To improve strength in legs and arms and have less pain.  NEXT MD VISIT: Jerre Simon, FNP on 03/03/22  OBJECTIVE:  Note: Objective measures were completed at Evaluation unless otherwise noted.  DIAGNOSTIC FINDINGS:  Thoracic Radiograph on 11/04/2022: IMPRESSION: No acute abnormality seen.  Lumbar Radiograph on 11/04/2022: FINDINGS: There is no evidence of lumbar spine fracture. Alignment is normal. Mild degenerative disc disease is noted at L3-4, L4-5 and L5-S1 with anterior osteophyte formation.  CT of cervical spine on 03/03/2022: 1. No evidence of acute fracture to the  cervical spine. 2. Dextrocurvature of the cervical spine. 3. Mild grade 1 spondylolisthesis at C2-C3 and C7-T1. 4. Prior C5-C6 ACDF. 5. Cervical spondylosis, as described. 6. 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 Additionally, there is suspected early osseous fusion across the left aspect of the disc space at this level.  PATIENT SURVEYS:  Eval:  LEFS 28 / 80 = 35.0 %  COGNITION: Overall cognitive status: Within functional limits for tasks assessed     SENSATION: Pt reports that he has numbness and tingling going down both legs and feet and occasionally has burning sensation in his arms and legs.  POSTURE: rounded shoulders and forward head  PALPATION: Pt with muscle spasms noted in cervical and lumbar region  LUMBAR ROM:   Eval:  Pt limited at least 25% "I can feel it pulling"  LOWER EXTREMITY ROM:     Eval:  WFL  LOWER/UPPER EXTREMITY MMT:    Eval: Right LE is WFL Left LE is 4/5 Bilat UE strength is WFL  LUMBAR SPECIAL TESTS:  Slump test: Negative  FUNCTIONAL TESTS:  Eval: 5 times sit to stand: 15.06 sec Timed up and go (TUG): 13.92 sec Single Leg Stance: right 4.01 sec, left 2.03 sec   11/29/2022: 6 minute walk:  921 ft with some reports of increased pain  GAIT: Distance walked: >100 ft Assistive device utilized: None Level of assistance: Complete Independence Comments: Pt reports that he has a walk tolerates about 800 yards before he starts feeling fatigued.  TODAY'S TREATMENT:                                                                                                                               DATE:  12/08/2022 Nustep level 5 x6 min  with PT present to discuss status Sit to/from stand holding 10# kettlebell 2x10 Standing rows with green tband 2x10 Standing shoulder extension with green tband 2x10 Standing shoulder horizontal abduction with green tband 2x10 Standing shoulder ER with green tband 2x10 Leg Press (seat at 6) 90# 2x10 Standing lat  pulldown 40# 2x10 Standing at barre with green tband around ankles:  hip abduction and hip extension.  2x10 each bilat Standing hamstring stretch at stairs 2x20 sec bilat Seated 3 way blue pball rollout:  5 x 10 sec each way Wall squat 2x5 Resisted gait with 10# cable pulley x5   12/06/2022 Nustep level 5 x6 min with PT present to discuss status Seated shoulder ER and horizontal abduction with green tband 2x10 each bilat (with cuing to slow down to improve muscle activation) Seated shoulder D2 with green tband 2x10 bilat Seated rows and with green tband 2x10 Standing shoulder extension with green tband 2x10 Sit to/from stand holding 10# kettlebell 2x10 Seated core series with 10# kettlebell:  hip to hip, hip to alt shoulder.  X10 each Seated side bend over peanut ball with overhead reach 2x10 bilat Leg Press (seat at 6) 80# 2x10 Standing at barre with blue loop around ankles:  hip abduction and hip extension.  2x10 each bilat Standing lat pulldown 40# 2x10   11/29/2022 Nustep level 5 x6 min with PT present to discuss status 6 minute walk:  921 ft with some reports of increased pain Sit to/from stand holding 10# kettlebell 2x10 Seated core series with 10# kettlebell:  hip to hip, hip to alt shoulder.  X10 each Seated rows and with green tband 2x10 Standing shoulder extension with green tband 2x10 Standing shoulder ER and horizontal abduction with green tband 2x10 each bilat (with cuing to slow down to improve muscle activation) Trigger Point Dry-Needling  Treatment instructions: Expect mild to moderate muscle soreness. S/S of pneumothorax if dry needled over a lung field, and to seek immediate medical attention should they occur. Patient verbalized understanding of these instructions and education. Patient Consent Given: Yes Education handout provided: Previously provided Muscles treated: bilat upper traps, bilat thoracic multifid, bilat rhomboids, bilat lats Electrical stimulation  performed: No Parameters: N/A Treatment response/outcome: Utilized skilled palpation to identify bony landmarks and trigger points.  Able to illicit twitch response and muscle elongation.  Soft tissue mobilization following to further promote tissue elongation.       PATIENT EDUCATION:  Education details: Issued HEP Person educated: Patient Education method: Explanation, Facilities manager, and Handouts Education comprehension: verbalized understanding  HOME EXERCISE PROGRAM: Access Code: [email or web address] URL: [email or web address] Date: 11/10/2022 Prepared by: Reather Laurence  Exercises - Seated Scapular Retraction  - 1 x daily - 7 x weekly - 2 sets - 10 reps - Sit to Stand  - 1 x daily - 7 x weekly - 2 sets - 5 reps - Standing March with Counter Support  - 1 x daily - 7 x weekly - 2 sets - 10 reps - Standing Hip Abduction with Unilateral Counter Support  - 1 x daily - 7 x weekly - 2 sets - 10 reps - Standing Hip Extension with Unilateral Counter Support  - 1 x daily - 7 x weekly - 2 sets - 10 reps  ASSESSMENT:  CLINICAL IMPRESSION: Mr Woodworth presents to skilled PT reporting some decreased pain.  Patient is progressing with improved strengthening during session.  Pt with some complaints of knee pain with wall squats.  Pt able to add in resisted backwards ambulation  with cable pulley.  Patient continues to require skilled PT to progress towards goal related activities.  OBJECTIVE IMPAIRMENTS: decreased activity tolerance, decreased balance, difficulty walking, decreased strength, dizziness, impaired perceived functional ability, increased muscle spasms, and pain.   ACTIVITY LIMITATIONS: lifting, sitting, squatting, stairs, and locomotion level  PARTICIPATION LIMITATIONS: driving and community activity  PERSONAL FACTORS: Past/current experiences, Time since onset of injury/illness/exacerbation, and 3+ comorbidities: recent Covid, OA, PVD  are also affecting patient's functional  outcome.   REHAB POTENTIAL: Good  CLINICAL DECISION MAKING: Evolving/moderate complexity  EVALUATION COMPLEXITY: Moderate   GOALS: Goals reviewed with patient? Yes  SHORT TERM GOALS: Target date: 11/26/2022  Patient will be independent with initial HEP. Baseline: Goal status: MET  2.  Patient will report at least a 25% improvement in symptoms since initial evaluation. Baseline:  Goal status: Met on 11/29/2022  3.  Patient will participate in 3 or 6 minute walk test to establish a baseline. Baseline:  Goal status: Met on 11/29/2022   LONG TERM GOALS: Target date: 12/31/2022  Patient will be independent with advanced HEP. Baseline:  Goal status: Ongoing  2.  Patient will increase Lower Extremity Functional Scale to at least 50% to demonstrate improvements in functional mobility. Baseline: 35% Goal status: INITIAL  3.  Patient will report ability to walk at least 10-15 minutes without a recovery period or loss of balance to allow patient to ambulate community distances. Baseline: 600 yards Goal status: Ongoing  4.  Patient will report ability to drive for greater than 1 hour without increased pain. Baseline:  Goal status: Ongoing  5.  Patient will increase UE/LE functional strength to [facility] to allow him to perform desired tasks/hobbies without difficulty. Baseline:  Goal status: INITIAL   PLAN:  PT FREQUENCY: 2x/week  PT DURATION: 8 weeks  PLANNED INTERVENTIONS: 97164- PT Re-evaluation, 97110-Therapeutic exercises, 97530- Therapeutic activity, 97112- Neuromuscular re-education, 97535- Self Care, 40981- Manual therapy, [id number]- Gait training, [id number]- Canalith repositioning, [id number]- Aquatic Therapy, 97014- Electrical stimulation (unattended), [id number]- Electrical stimulation (manual), [id number]- Vasopneumatic device, [id number]- Ultrasound, [id number]- Traction (mechanical), [id number]- Ionotophoresis 4mg /ml Dexamethasone, Patient/Family education, Balance training, Stair training, Taping, Dry  Needling, Joint mobilization, Joint manipulation, Spinal manipulation, Spinal mobilization, Cryotherapy, and Moist heat.  PLAN FOR NEXT SESSION: Assess and progress HEP as indicated, strengthening, DN/Manual therapy as indicated   Reather Laurence, PT, DPT 12/08/22, 11:15 AM  [facility] [address] [place], [place] [address] Phone # [phone] Fax [phone]

## 2022-12-13 ENCOUNTER — Encounter: Payer: Self-pay | Admitting: Physical Therapy

## 2022-12-13 ENCOUNTER — Ambulatory Visit: Payer: Medicare Other | Admitting: Physical Therapy

## 2022-12-13 DIAGNOSIS — R252 Cramp and spasm: Secondary | ICD-10-CM

## 2022-12-13 DIAGNOSIS — R2689 Other abnormalities of gait and mobility: Secondary | ICD-10-CM

## 2022-12-13 DIAGNOSIS — M6281 Muscle weakness (generalized): Secondary | ICD-10-CM | POA: Diagnosis not present

## 2022-12-13 DIAGNOSIS — M542 Cervicalgia: Secondary | ICD-10-CM

## 2022-12-13 NOTE — Therapy (Signed)
OUTPATIENT PHYSICAL THERAPY TREATMENT NOTE   Patient Name: Jonathan Savage MRN: [id number] DOB:[birth year], 82 y.o., male Today's Date: 12/13/2022  END OF SESSION:  PT End of Session - 12/13/22 1018     Visit Number 8    Date for PT Re-Evaluation 12/31/22    Authorization Type Medicare    Progress Note Due on Visit 10    PT Start Time 1016    PT Stop Time 1103    PT Time Calculation (min) 47 min    Activity Tolerance Patient tolerated treatment well    Behavior During Therapy WFL for tasks assessed/performed              Past Medical History:  Diagnosis Date   Carotid artery occlusion    Complication of anesthesia    pt states following last surgery he was awake for over 24 hours following surgery   Hypertension    Stroke [facility])    Past Surgical History:  Procedure Laterality Date   ENDARTERECTOMY Left 08/22/2019   Procedure: LEFT CAROTID ENDARTERECTOMY WITH BOVIE PATCH ANGIOPLASTY;  Surgeon: Nada Libman, MD;  Location: [facility] OR;  Service: Vascular;  Laterality: Left;   LAPAROSCOPIC NEPHRECTOMY Right    NECK SURGERY     PROSTATE SURGERY     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  SUBJECTIVE:                                                                                                                                                                                           SUBJECTIVE STATEMENT: Feeling good today.     PERTINENT HISTORY:  Essential Hypertension, Claudication in the peripheral vascular disease, carotid stenosis, pt has one kidney, Hx of syncope, recent COVID on 10/25/2022  PAIN:  Are you  having pain? Yes: NPRS scale: 2/10 Pain location: right low back and into legs.  Occasionally has cervical pain as well Pain description: throbbing Aggravating factors: lying on right side, prolonged sitting, prolonged driving Relieving factors: standing, movement  PRECAUTIONS: None  RED FLAGS: None   WEIGHT BEARING RESTRICTIONS: No  FALLS:  Has patient fallen in last 6 months? No  LIVING ENVIRONMENT: Lives with: lives alone Lives in: House/apartment Stairs:  one story Has following equipment at home: Grab bars  OCCUPATION: Retired  PLOF: Independent and Leisure: clocks, firearms/knives, cars, woodworking/construction  PATIENT GOALS: To improve strength in legs and arms and have less pain.  NEXT MD VISIT: Jerre Simon, FNP on 03/03/22  OBJECTIVE:  Note: Objective measures were completed at Evaluation unless otherwise noted.  DIAGNOSTIC FINDINGS:  Thoracic Radiograph on 11/04/2022: IMPRESSION: No acute abnormality seen.  Lumbar Radiograph on 11/04/2022: FINDINGS: There is no evidence of lumbar spine fracture. Alignment is normal. Mild degenerative disc disease is noted at L3-4, L4-5 and L5-S1 with anterior osteophyte formation.  CT of cervical spine on 03/03/2022: 1. No evidence of acute fracture to the cervical spine. 2. Dextrocurvature of the cervical spine. 3. Mild grade 1 spondylolisthesis at  C2-C3 and C7-T1. 4. Prior C5-C6 ACDF. 5. Cervical spondylosis, as described. 6. 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 Additionally, there is suspected early osseous fusion across the left aspect of the disc space at this level.  PATIENT SURVEYS:  Eval:  LEFS 28 / 80 = 35.0 %  COGNITION: Overall cognitive status: Within functional limits for tasks assessed     SENSATION: Pt reports that he has numbness and tingling going down both legs and feet and occasionally has burning sensation in his arms and legs.  POSTURE: rounded shoulders and forward head  PALPATION: Pt with muscle spasms noted in cervical and lumbar region  LUMBAR ROM:   Eval:  Pt limited at least 25% "I can feel it pulling"  LOWER EXTREMITY ROM:     Eval:  WFL  LOWER/UPPER EXTREMITY MMT:    Eval: Right LE is WFL Left LE is 4/5 Bilat UE strength is WFL  LUMBAR SPECIAL TESTS:  Slump test: Negative  FUNCTIONAL TESTS:  Eval: 5 times sit to stand: 15.06 sec Timed up and go (TUG): 13.92 sec Single Leg Stance: right 4.01 sec, left 2.03 sec   11/29/2022: 6 minute walk:  921 ft with some reports of increased pain  GAIT: Distance walked: >100 ft Assistive device utilized: None Level of assistance: Complete Independence Comments: Pt reports that he has a walk tolerates about 800 yards before he starts feeling fatigued.  TODAY'S TREATMENT:                                                                                                                              DATE:   12/13/2022 Nustep level 5 x6 min with PT present to discuss status Sit to/from stand holding 10# kettlebell 2x10 Seated rotation  with 10#KB 2x10 R QL release in S/L MFR with ball to R QL UPA mobs B lumbar Cat cow x 10 Standing QL stretch B x 60 sec Standing rows with green tband 2x10 Standing shoulder extension with green tband 2x10 Standing shoulder horizontal abduction with green tband 2x10 Standing shoulder ER with green tband  2x10 Leg Press (seat at 6) 90# 2x10, 1x 5 Standing lat pulldown 40# 2x10 Seated 3 way blue pball rollout:  5 x 10 sec each way Resisted gait with 10# cable pulley x5  12/08/2022 Nustep level 5 x6 min with PT present to discuss status Sit to/from stand holding 10# kettlebell 2x10 Standing rows with green tband 2x10 Standing shoulder extension with green tband 2x10 Standing shoulder horizontal abduction with green tband 2x10 Standing shoulder ER with green tband 2x10 Leg Press (seat at 6) 90# 2x10 Standing lat pulldown 40# 2x10 Standing at barre with green tband around ankles:  hip abduction and hip extension.  2x10 each bilat Standing hamstring stretch at stairs 2x20 sec bilat Seated 3 way blue pball rollout:  5 x 10 sec each way Wall squat 2x5 Resisted gait with 10# cable pulley x5   12/06/2022 Nustep level 5 x6 min with PT present to discuss status Seated shoulder ER and horizontal abduction with green tband 2x10 each bilat (with cuing to slow down to improve muscle activation) Seated shoulder D2 with green tband 2x10 bilat Seated rows and with green tband 2x10 Standing shoulder extension with green tband 2x10 Sit to/from stand holding 10# kettlebell 2x10 Seated core series with 10# kettlebell:  hip to hip, hip to alt shoulder.  X10 each Seated side bend over peanut ball with overhead reach 2x10 bilat Leg Press (seat at 6) 80# 2x10 Standing at barre with blue loop around ankles:  hip abduction and hip extension.  2x10 each bilat Standing lat pulldown 40# 2x10  PATIENT EDUCATION:  Education details: Issued HEP Person educated: Patient Education method: Explanation, Demonstration, and Handouts Education comprehension: verbalized understanding  HOME EXERCISE PROGRAM: Access Code: [email or web address] URL: [email or web address] Date: 11/10/2022 Prepared by: Reather Laurence  Exercises - Seated Scapular Retraction  - 1 x daily - 7 x weekly - 2 sets - 10 reps - Sit to Stand   - 1 x daily - 7 x weekly - 2 sets - 5 reps - Standing March with Counter Support  - 1 x daily - 7 x weekly - 2 sets - 10 reps - Standing Hip Abduction with Unilateral Counter Support  - 1 x daily - 7 x weekly - 2 sets - 10 reps - Standing Hip Extension with Unilateral Counter Support  - 1 x daily - 7 x weekly - 2 sets - 10 reps  ASSESSMENT:  CLINICAL IMPRESSION: Ilyan was very tight and tender in his R lumbar today. Some relief with TPR to R QL and unilateral mobs. He may benefit from another trial of DN. Pt educated in self MFR with ball to address tightness at home. Limited lumbar mobility with cat/cow, but it did improve with reps. He reports no difficulty driving for an hour meeting LTG #4.   OBJECTIVE IMPAIRMENTS: decreased activity tolerance, decreased balance, difficulty walking, decreased strength, dizziness, impaired perceived functional ability, increased muscle spasms, and pain.   ACTIVITY LIMITATIONS: lifting, sitting, squatting, stairs, and locomotion level  PARTICIPATION LIMITATIONS: driving and community activity  PERSONAL FACTORS: Past/current experiences, Time since onset of injury/illness/exacerbation, and 3+ comorbidities: recent Covid, OA, PVD  are also affecting patient's functional outcome.  REHAB POTENTIAL: Good  CLINICAL DECISION MAKING: Evolving/moderate complexity  EVALUATION COMPLEXITY: Moderate   GOALS: Goals reviewed with patient? Yes  SHORT TERM GOALS: Target date: 11/26/2022  Patient will be independent with initial HEP. Baseline: Goal status: MET  2.  Patient will report at least a 25% improvement in symptoms since initial evaluation. Baseline:  Goal status: Met on 11/29/2022  3.  Patient will participate in 3 or 6 minute walk test to establish a baseline. Baseline:  Goal status: Met on 11/29/2022   LONG TERM GOALS: Target date: 12/31/2022  Patient will be independent with advanced HEP. Baseline:  Goal status: Ongoing  2.  Patient  will increase Lower Extremity Functional Scale to at least 50% to demonstrate improvements in functional mobility. Baseline: 35% Goal status: INITIAL  3.  Patient will report ability to walk at least 10-15 minutes without a recovery period or loss of balance to allow patient to ambulate community distances. Baseline: 600 yards Goal status: Ongoing  4.  Patient will report ability to drive for greater than 1 hour without increased pain. Baseline:  Goal status: MET  5.  Patient will increase UE/LE functional strength to [facility] to allow him to perform desired tasks/hobbies without difficulty. Baseline:  Goal status: INITIAL   PLAN:  PT FREQUENCY: 2x/week  PT DURATION: 8 weeks  PLANNED INTERVENTIONS: 97164- PT Re-evaluation, 97110-Therapeutic exercises, 97530- Therapeutic activity, 97112- Neuromuscular re-education, 97535- Self Care, 02725- Manual therapy, [id number]- Gait training, [phone]- Canalith repositioning, [id number]- Aquatic Therapy, 97014- Electrical stimulation (unattended), [id number]- Electrical stimulation (manual), [id number]- Vasopneumatic device, [id number]- Ultrasound, [id number]- Traction (mechanical), [id number]- Ionotophoresis 4mg /ml Dexamethasone, Patient/Family education, Balance training, Stair training, Taping, Dry Needling, Joint mobilization, Joint manipulation, Spinal manipulation, Spinal mobilization, Cryotherapy, and Moist heat.  PLAN FOR NEXT SESSION: Assess and progress HEP as indicated, strengthening, DN/Manual therapy as indicated   Solon Palm, PT  12/13/22, 1:25 PM  [facility] [address], [address] [place], [place] [address] Phone # [phone] Fax [phone]

## 2022-12-15 ENCOUNTER — Encounter: Payer: Self-pay | Admitting: Rehabilitative and Restorative Service Providers"

## 2022-12-15 ENCOUNTER — Ambulatory Visit: Payer: Medicare Other | Admitting: Rehabilitative and Restorative Service Providers"

## 2022-12-15 DIAGNOSIS — M6281 Muscle weakness (generalized): Secondary | ICD-10-CM

## 2022-12-15 DIAGNOSIS — R262 Difficulty in walking, not elsewhere classified: Secondary | ICD-10-CM

## 2022-12-15 DIAGNOSIS — M542 Cervicalgia: Secondary | ICD-10-CM

## 2022-12-15 DIAGNOSIS — R252 Cramp and spasm: Secondary | ICD-10-CM

## 2022-12-15 DIAGNOSIS — R2689 Other abnormalities of gait and mobility: Secondary | ICD-10-CM

## 2022-12-15 NOTE — Therapy (Signed)
OUTPATIENT PHYSICAL THERAPY TREATMENT NOTE   Patient Name: Jonathan Savage MRN: [id number] DOB:15-Sep-1940, 82 y.o., male Today's Date: 12/15/2022  END OF SESSION:  PT End of Session - 12/15/22 1021     Visit Number 9    Date for PT Re-Evaluation 12/31/22    Authorization Type Medicare    Progress Note Due on Visit 10    PT Start Time 1016    PT Stop Time 1055    PT Time Calculation (min) 39 min    Activity Tolerance Patient tolerated treatment well    Behavior During Therapy WFL for tasks assessed/performed              Past Medical History:  Diagnosis Date   Carotid artery occlusion    Complication of anesthesia    pt states following last surgery he was awake for over 24 hours following surgery   Hypertension    Stroke [facility])    Past Surgical History:  Procedure Laterality Date   ENDARTERECTOMY Left 08/22/2019   Procedure: LEFT CAROTID ENDARTERECTOMY WITH BOVIE PATCH ANGIOPLASTY;  Surgeon: Nada Libman, MD;  Location: [facility] OR;  Service: Vascular;  Laterality: Left;   LAPAROSCOPIC NEPHRECTOMY Right    NECK SURGERY     PROSTATE SURGERY     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  SUBJECTIVE:                                                                                                                                                                                           SUBJECTIVE STATEMENT: Pt reports being sore in his shoulder this morning.  States that he is going to try going to the gym later today to see how it goes.  PERTINENT HISTORY:  Essential Hypertension, Claudication in the peripheral vascular disease, carotid stenosis, pt has one kidney, Hx of syncope, recent COVID on 10/25/2022  PAIN:  Are you having pain? Yes: NPRS scale: 4/10 Pain location: right low back and into legs.  Occasionally has cervical pain as well Pain description: throbbing Aggravating factors: lying on right side, prolonged sitting, prolonged driving Relieving factors: standing, movement  PRECAUTIONS: None  RED FLAGS: None   WEIGHT BEARING RESTRICTIONS: No  FALLS:  Has patient fallen in last 6 months? No  LIVING ENVIRONMENT: Lives with: lives alone Lives in: House/apartment Stairs:  one story Has following equipment at home: Grab bars  OCCUPATION: Retired  PLOF: Independent and Leisure: clocks, firearms/knives, cars, woodworking/construction  PATIENT GOALS: To improve strength in legs and arms and have less pain.  NEXT MD VISIT: Jerre Simon, FNP on 03/03/22  OBJECTIVE:  Note: Objective measures were completed at Evaluation unless otherwise noted.  DIAGNOSTIC FINDINGS:  Thoracic Radiograph on 11/04/2022: IMPRESSION: No acute abnormality seen.  Lumbar Radiograph on 11/04/2022: FINDINGS: There is no evidence of lumbar spine fracture. Alignment is normal. Mild degenerative disc disease is noted at L3-4, L4-5 and L5-S1 with anterior osteophyte formation.  CT of cervical  spine on 03/03/2022: 1. No evidence of acute fracture to the cervical spine. 2. Dextrocurvature of the cervical spine. 3. Mild grade 1 spondylolisthesis at C2-C3 and C7-T1. 4. Prior C5-C6 ACDF. 5. Cervical spondylosis, as described. 6. 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 Additionally, there is suspected early osseous fusion across the left aspect of the disc space at this level.  PATIENT SURVEYS:  Eval:  LEFS 28 / 80 = 35.0 % 12/15/2022:  Lower Extremity Functional Score: 72 / 80 = 90.0 %  COGNITION: Overall cognitive status: Within functional limits for tasks assessed     SENSATION: Pt reports that he has numbness and tingling going down both legs and feet and occasionally has burning sensation in his arms and legs.  POSTURE: rounded shoulders and forward head  PALPATION: Pt with muscle spasms noted in cervical and lumbar region  LUMBAR ROM:   Eval:  Pt limited at least 25% "I can feel it pulling"  LOWER EXTREMITY ROM:     Eval:  WFL  LOWER/UPPER EXTREMITY MMT:    Eval: Right LE is WFL Left LE is 4/5 Bilat UE strength is WFL  LUMBAR SPECIAL TESTS:  Slump test: Negative  FUNCTIONAL TESTS:  Eval: 5 times sit to stand: 15.06 sec Timed up and go (TUG): 13.92 sec Single Leg Stance: right 4.01 sec, left 2.03 sec   11/29/2022: 6 minute walk:  921 ft with some reports of increased pain  GAIT: Distance walked: >100 ft Assistive device utilized: None Level of assistance: Complete Independence Comments: Pt reports that he has a walk tolerates about 800 yards before he starts feeling fatigued.  TODAY'S TREATMENT:  DATE:  12/15/2022 Nustep level 5 x6 min with PT present to discuss status Sit to/from stand holding 10# kettlebell 2x10 Seated core series with 10# kettlebell:  hip to hip, hip to alt shoulder.  X10 each Seated modified dead lift  10# 2x10 Standing rows with green tband 2x10 Standing shoulder extension with green tband 2x10 Standing palloff press with green tband 2x10 bilat Standing shoulder horizontal abduction with green tband 2x10 Standing shoulder ER with green tband 2x10 Leg Press (seat at 6) 90# 2x10, then heel raise 2x10.  110# leg press x10 Standing lat pulldown 40# 2x10 Resisted gait with 10# cable pulley 2x10   12/13/2022 Nustep level 5 x6 min with PT present to discuss status Sit to/from stand holding 10# kettlebell 2x10 Seated rotation with 10#KB 2x10 R QL release in S/L MFR with ball to R QL UPA mobs B lumbar Cat cow x 10 Standing QL stretch B x 60 sec Standing rows with green tband 2x10 Standing shoulder extension with green tband 2x10 Standing shoulder horizontal abduction with green tband 2x10 Standing shoulder ER with green tband 2x10 Leg Press (seat at 6) 90# 2x10, 1x 5 Standing lat pulldown 40# 2x10 Seated 3 way blue pball rollout:  5 x 10 sec each way Resisted gait with 10# cable pulley x5  12/08/2022 Nustep level 5 x6 min with PT present to discuss status Sit to/from stand holding 10# kettlebell 2x10 Standing rows with green tband 2x10 Standing shoulder extension with green tband 2x10 Standing shoulder horizontal abduction with green tband 2x10 Standing shoulder ER with green tband 2x10 Leg Press (seat at 6) 90# 2x10 Standing lat pulldown 40# 2x10 Standing at barre with green tband around ankles:  hip abduction and hip extension.  2x10 each bilat Standing hamstring stretch at stairs 2x20 sec bilat Seated 3 way blue pball rollout:  5 x 10 sec each way Wall squat 2x5 Resisted gait with 10# cable pulley x5   PATIENT EDUCATION:  Education details: Issued HEP Person educated: Patient Education method: Explanation, Facilities manager, and Handouts Education comprehension: verbalized understanding  HOME EXERCISE PROGRAM: Access Code: [email or web address] URL:  [email or web address] Date: 11/10/2022 Prepared by: Reather Laurence  Exercises - Seated Scapular Retraction  - 1 x daily - 7 x weekly - 2 sets - 10 reps - Sit to Stand  - 1 x daily - 7 x weekly - 2 sets - 5 reps - Standing March with Counter Support  - 1 x daily - 7 x weekly - 2 sets - 10 reps - Standing Hip Abduction with Unilateral Counter Support  - 1 x daily - 7 x weekly - 2 sets - 10 reps - Standing Hip Extension with Unilateral Counter Support  - 1 x daily - 7 x weekly - 2 sets - 10 reps  ASSESSMENT:  CLINICAL IMPRESSION: Mr Tango presents to skilled PT reporting that he is overall feeling better and going to try going to the gym this week.  Patient states that if the gym goes well, he is agreeable to an early discharge to continue exercises at the gym.  Patient with improved score noted on LEFS with reports that he is able to walk for greater than 20 minutes to go grocery shopping.  Will assess patients progress with returning to the gym next visit with possible early discharge if patient reports no difficulty.  OBJECTIVE IMPAIRMENTS: decreased activity tolerance, decreased balance, difficulty walking, decreased strength, dizziness, impaired perceived functional ability, increased muscle spasms, and pain.   ACTIVITY LIMITATIONS: lifting,  sitting, squatting, stairs, and locomotion level  PARTICIPATION LIMITATIONS: driving and community activity  PERSONAL FACTORS: Past/current experiences, Time since onset of injury/illness/exacerbation, and 3+ comorbidities: recent Covid, OA, PVD  are also affecting patient's functional outcome.   REHAB POTENTIAL: Good  CLINICAL DECISION MAKING: Evolving/moderate complexity  EVALUATION COMPLEXITY: Moderate   GOALS: Goals reviewed with patient? Yes  SHORT TERM GOALS: Target date: 11/26/2022  Patient will be independent with initial HEP. Baseline: Goal status: MET  2.  Patient will report at least a 25% improvement in symptoms  since initial evaluation. Baseline:  Goal status: Met on 11/29/2022  3.  Patient will participate in 3 or 6 minute walk test to establish a baseline. Baseline:  Goal status: Met on 11/29/2022   LONG TERM GOALS: Target date: 12/31/2022  Patient will be independent with advanced HEP. Baseline:  Goal status: Ongoing  2.  Patient will increase Lower Extremity Functional Scale to at least 50% to demonstrate improvements in functional mobility. Baseline: 35% Goal status: MET on 12/15/22  3.  Patient will report ability to walk at least 10-15 minutes without a recovery period or loss of balance to allow patient to ambulate community distances. Baseline: 600 yards Goal status: MET on 12/15/22  4.  Patient will report ability to drive for greater than 1 hour without increased pain. Baseline:  Goal status: MET  5.  Patient will increase UE/LE functional strength to [facility] to allow him to perform desired tasks/hobbies without difficulty. Baseline:  Goal status: Ongoing   PLAN:  PT FREQUENCY: 2x/week  PT DURATION: 8 weeks  PLANNED INTERVENTIONS: 97164- PT Re-evaluation, 97110-Therapeutic exercises, 97530- Therapeutic activity, 97112- Neuromuscular re-education, 97535- Self Care, 16109- Manual therapy, [id number]- Gait training, [phone]- Canalith repositioning, [id number]- Aquatic Therapy, 97014- Electrical stimulation (unattended), [id number]- Electrical stimulation (manual), [id number]- Vasopneumatic device, [id number]- Ultrasound, [id number]- Traction (mechanical), [id number]- Ionotophoresis 4mg /ml Dexamethasone, Patient/Family education, Balance training, Stair training, Taping, Dry Needling, Joint mobilization, Joint manipulation, Spinal manipulation, Spinal mobilization, Cryotherapy, and Moist heat.  PLAN FOR NEXT SESSION: Assess and progress HEP as indicated, strengthening, DN/Manual therapy as indicated, assess how patient did at the gym.   Reather Laurence, PT, DPT 12/15/22, 11:10 AM  [facility] [address], [address] [place], [place] [address] Phone # [phone] Fax [phone]

## 2022-12-20 ENCOUNTER — Encounter: Payer: Self-pay | Admitting: Rehabilitative and Restorative Service Providers"

## 2022-12-20 ENCOUNTER — Ambulatory Visit: Payer: Medicare Other | Admitting: Rehabilitative and Restorative Service Providers"

## 2022-12-20 DIAGNOSIS — R252 Cramp and spasm: Secondary | ICD-10-CM

## 2022-12-20 DIAGNOSIS — M6281 Muscle weakness (generalized): Secondary | ICD-10-CM

## 2022-12-20 DIAGNOSIS — M542 Cervicalgia: Secondary | ICD-10-CM

## 2022-12-20 DIAGNOSIS — R2689 Other abnormalities of gait and mobility: Secondary | ICD-10-CM

## 2022-12-20 DIAGNOSIS — R262 Difficulty in walking, not elsewhere classified: Secondary | ICD-10-CM

## 2022-12-20 NOTE — Therapy (Signed)
OUTPATIENT PHYSICAL THERAPY TREATMENT NOTE AND DISCHARGE SUMMARY   Patient Name: Jonathan Savage MRN: [id number] DOB:[birth year], 82 y.o., male Today's Date: 12/20/2022  END OF SESSION:  PT End of Session - 12/20/22 1013     Visit Number 10    Date for PT Re-Evaluation 12/31/22    Authorization Type Medicare    PT Start Time 1012    PT Stop Time 1050    PT Time Calculation (min) 38 min    Activity Tolerance Patient tolerated treatment well    Behavior During Therapy WFL for tasks assessed/performed              Past Medical History:  Diagnosis Date   Carotid artery occlusion    Complication of anesthesia    pt states following last surgery he was awake for over 24 hours following surgery   Hypertension    Stroke [facility])    Past Surgical History:  Procedure Laterality Date   ENDARTERECTOMY Left 08/22/2019   Procedure: LEFT CAROTID ENDARTERECTOMY WITH BOVIE PATCH ANGIOPLASTY;  Surgeon: Nada Libman, MD;  Location: [facility] OR;  Service: Vascular;  Laterality: Left;   LAPAROSCOPIC NEPHRECTOMY Right    NECK SURGERY     PROSTATE SURGERY     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  SUBJECTIVE:                                                                                                                                                                                           SUBJECTIVE STATEMENT: Pt states return to the gym went well and he is ready for discharge from PT today.  PERTINENT HISTORY:  Essential Hypertension, Claudication in the peripheral vascular disease, carotid stenosis, pt has one kidney,  Hx of syncope, recent COVID on 10/25/2022  PAIN:  Are you having pain? Yes: NPRS scale: 3-4/10 Pain location: right low back and into legs.  Occasionally has cervical pain as well Pain description: throbbing Aggravating factors: lying on right side, prolonged sitting, prolonged driving Relieving factors: standing, movement  PRECAUTIONS: None  RED FLAGS: None   WEIGHT BEARING RESTRICTIONS: No  FALLS:  Has patient fallen in last 6 months? No  LIVING ENVIRONMENT: Lives with: lives alone Lives in: House/apartment Stairs:  one story Has following equipment at home: Grab bars  OCCUPATION: Retired  PLOF: Independent and Leisure: clocks, firearms/knives, cars, woodworking/construction  PATIENT GOALS: To improve strength in legs and arms and have less pain.  NEXT MD VISIT: Jerre Simon, FNP on 03/03/22  OBJECTIVE:  Note: Objective measures were completed at Evaluation unless otherwise noted.  DIAGNOSTIC FINDINGS:  Thoracic Radiograph on 11/04/2022: IMPRESSION: No acute abnormality seen.  Lumbar Radiograph on 11/04/2022: FINDINGS: There is no evidence of lumbar spine fracture. Alignment is normal. Mild degenerative disc disease is noted at L3-4, L4-5 and L5-S1 with anterior osteophyte formation.  CT of cervical spine on 03/03/2022: 1. No evidence of acute fracture to the cervical spine. 2.  Dextrocurvature of the cervical spine. 3. Mild grade 1 spondylolisthesis at C2-C3 and C7-T1. 4. Prior C5-C6 ACDF. 5. Cervical spondylosis, as described. 6. 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 Additionally, there is suspected early osseous fusion across the left aspect of the disc space at this level.  PATIENT SURVEYS:  Eval:  LEFS 28 / 80 = 35.0 % 12/15/2022:  Lower Extremity Functional Score: 72 / 80 = 90.0 %  COGNITION: Overall cognitive status: Within functional limits for tasks assessed     SENSATION: Pt reports that he has numbness and tingling going down both legs and feet and occasionally has burning sensation in his arms and legs.  POSTURE: rounded shoulders and forward head  PALPATION: Pt with muscle spasms noted in cervical and lumbar region  LUMBAR ROM:   Eval:  Pt limited at least 25% "I can feel it pulling"  LOWER EXTREMITY ROM:     Eval:  WFL  LOWER/UPPER EXTREMITY MMT:    Eval: Right LE is WFL Left LE is 4/5 Bilat UE strength is WFL  LUMBAR SPECIAL TESTS:  Slump test: Negative  FUNCTIONAL TESTS:  Eval: 5 times sit to stand: 15.06 sec Timed up and go (TUG): 13.92 sec Single Leg Stance: right 4.01 sec, left 2.03 sec   11/29/2022: 6 minute walk:  921 ft with some reports of increased pain  GAIT: Distance walked: >100 ft Assistive device utilized: None Level of assistance: Complete Independence Comments: Pt reports that he has a walk tolerates about 800 yards before he starts feeling fatigued.  TODAY'S TREATMENT:  DATE:  12/20/2022 Nustep level 5 x6 min with PT present to discuss status Standing shoulder horizontal abduction with green tband 2x10 Standing shoulder ER with green tband 2x10 Standing rows with green tband 2x10 Standing shoulder extension with green tband 2x10 Standing palloff press with green tband 2x10  bilat Hip flexor stretch with foot on chair 2x20 sec bilat Seated hamstring stretch 2x20 sec bilat Squat taps to chair with mod cuing for technique to decrease strain on knees 2x10 Standing lat pulldown 40# 2x10 Leg Press (seat at 6) 110# 2x10 Standing balance on wobble board 2x1 min with UE support as needed   12/15/2022 Nustep level 5 x6 min with PT present to discuss status Sit to/from stand holding 10# kettlebell 2x10 Seated core series with 10# kettlebell:  hip to hip, hip to alt shoulder.  X10 each Seated modified dead lift 10# 2x10 Standing rows with green tband 2x10 Standing shoulder extension with green tband 2x10 Standing palloff press with green tband 2x10 bilat Standing shoulder horizontal abduction with green tband 2x10 Standing shoulder ER with green tband 2x10 Leg Press (seat at 6) 90# 2x10, then heel raise 2x10.  110# leg press x10 Standing lat pulldown 40# 2x10 Resisted gait with 10# cable pulley 2x10   12/13/2022 Nustep level 5 x6 min with PT present to discuss status Sit to/from stand holding 10# kettlebell 2x10 Seated rotation with 10#KB 2x10 R QL release in S/L MFR with ball to R QL UPA mobs B lumbar Cat cow x 10 Standing QL stretch B x 60 sec Standing rows with green tband 2x10 Standing shoulder extension with green tband 2x10 Standing shoulder horizontal abduction with green tband 2x10 Standing shoulder ER with green tband 2x10 Leg Press (seat at 6) 90# 2x10, 1x 5 Standing lat pulldown 40# 2x10 Seated 3 way blue pball rollout:  5 x 10 sec each way Resisted gait with 10# cable pulley x5   PATIENT EDUCATION:  Education details: Issued HEP Person educated: Patient Education method: Explanation, Facilities manager, and Handouts Education comprehension: verbalized understanding  HOME EXERCISE PROGRAM: Access Code: [email or web address] URL: [email or web address] Date: 12/20/2022 Prepared by: Reather Laurence  Exercises - Standing March with Counter  Support  - 1 x daily - 7 x weekly - 2 sets - 10 reps - Standing Hip Abduction with Unilateral Counter Support  - 1 x daily - 7 x weekly - 2 sets - 10 reps - Standing Hip Extension with Unilateral Counter Support  - 1 x daily - 7 x weekly - 2 sets - 10 reps - Shoulder External Rotation and Scapular Retraction with Resistance  - 1 x daily - 7 x weekly - 2 sets - 10 reps - Standing Shoulder Horizontal Abduction with Resistance  - 1 x daily - 7 x weekly - 2 sets - 10 reps - Standing Shoulder Row with Anchored Resistance  - 1 x daily - 7 x weekly - 2 sets - 10 reps - Standing Anti-Rotation Press with Anchored Resistance  - 1 x daily - 7 x weekly - 2 sets - 10 reps - Shoulder extension with resistance - Neutral  - 1 x daily - 7 x weekly - 2 sets - 10 reps - Squat with Chair Touch  - 1 x daily - 7 x weekly - 2 sets - 10 reps - Seated Hamstring Stretch  - 1 x daily - 7 x weekly - 2 reps - 20 sec hold - Hip Flexor Stretch with Chair  - 1 x daily - 7  x weekly - 2 reps - 20 sec hold  ASSESSMENT:  CLINICAL IMPRESSION: Mr Hartsfield presents to skilled PT reporting that he was able to return to the gym and is ready for today to be his last visit for PT.  Patient was provided with an updated HEP handout and green theraband for home use.  Patient has improved his functional strength and has been able to resume all previous activities and has been able to return to exercising at the gym.  Patient has met all goals at this time and is ready for discharge from skilled PT to continue with HEP.  OBJECTIVE IMPAIRMENTS: decreased activity tolerance, decreased balance, difficulty walking, decreased strength, dizziness, impaired perceived functional ability, increased muscle spasms, and pain.   ACTIVITY LIMITATIONS: lifting, sitting, squatting, stairs, and locomotion level  PARTICIPATION LIMITATIONS: driving and community activity  PERSONAL FACTORS: Past/current experiences, Time since onset of  injury/illness/exacerbation, and 3+ comorbidities: recent Covid, OA, PVD  are also affecting patient's functional outcome.   REHAB POTENTIAL: Good  CLINICAL DECISION MAKING: Evolving/moderate complexity  EVALUATION COMPLEXITY: Moderate   GOALS: Goals reviewed with patient? Yes  SHORT TERM GOALS: Target date: 11/26/2022  Patient will be independent with initial HEP. Baseline: Goal status: MET  2.  Patient will report at least a 25% improvement in symptoms since initial evaluation. Baseline:  Goal status: Met on 11/29/2022  3.  Patient will participate in 3 or 6 minute walk test to establish a baseline. Baseline:  Goal status: Met on 11/29/2022   LONG TERM GOALS: Target date: 12/31/2022  Patient will be independent with advanced HEP. Baseline:  Goal status: MET on 12/20/2022  2.  Patient will increase Lower Extremity Functional Scale to at least 50% to demonstrate improvements in functional mobility. Baseline: 35% Goal status: MET on 12/15/22  3.  Patient will report ability to walk at least 10-15 minutes without a recovery period or loss of balance to allow patient to ambulate community distances. Baseline: 600 yards Goal status: MET on 12/15/22  4.  Patient will report ability to drive for greater than 1 hour without increased pain. Baseline:  Goal status: MET  5.  Patient will increase UE/LE functional strength to [facility] to allow him to perform desired tasks/hobbies without difficulty. Baseline:  Goal status: Met on 12/20/2022   PLAN:  PT FREQUENCY: 2x/week  PT DURATION: 8 weeks  PLANNED INTERVENTIONS: 97164- PT Re-evaluation, 97110-Therapeutic exercises, 97530- Therapeutic activity, 97112- Neuromuscular re-education, 97535- Self Care, 16109- Manual therapy, [id number]- Gait training, [phone]- Canalith repositioning, [id number]- Aquatic Therapy, 97014- Electrical stimulation (unattended), [id number]- Electrical stimulation (manual), [id number]- Vasopneumatic device, [id number]- Ultrasound,  [id number]- Traction (mechanical), [id number]- Ionotophoresis 4mg /ml Dexamethasone, Patient/Family education, Balance training, Stair training, Taping, Dry Needling, Joint mobilization, Joint manipulation, Spinal manipulation, Spinal mobilization, Cryotherapy, and Moist heat.    PHYSICAL THERAPY DISCHARGE SUMMARY  Patient agrees to discharge. Patient goals were met. Patient is being discharged due to meeting the stated rehab goals.    Reather Laurence, PT, DPT 12/20/22, 11:54 AM  [facility] [address] [place], [place] [address] Phone # [phone] Fax [phone]

## 2022-12-22 ENCOUNTER — Encounter: Payer: Medicare Other | Admitting: Rehabilitative and Restorative Service Providers"

## 2022-12-27 ENCOUNTER — Encounter: Payer: Medicare Other | Admitting: Rehabilitative and Restorative Service Providers"

## 2022-12-30 ENCOUNTER — Encounter: Payer: Medicare Other | Admitting: Rehabilitative and Restorative Service Providers"

## 2023-01-19 ENCOUNTER — Other Ambulatory Visit: Payer: Self-pay | Admitting: Cardiovascular Disease

## 2023-01-19 DIAGNOSIS — E782 Mixed hyperlipidemia: Secondary | ICD-10-CM

## 2023-03-03 ENCOUNTER — Telehealth (HOSPITAL_BASED_OUTPATIENT_CLINIC_OR_DEPARTMENT_OTHER): Payer: Self-pay | Admitting: Family Medicine

## 2023-03-03 NOTE — Telephone Encounter (Signed)
 Patient's appt is still scheduled with Jon Gills 2/28 at 10:10. Called and spoke with pt letting him know that we still had him on schedule and he verbalized understanding. Nothing further needed.

## 2023-03-03 NOTE — Telephone Encounter (Signed)
 Copied from CRM [id number]. Topic: Appointments - Scheduling Inquiry for Clinic >> Mar 03, 2023  8:12 AM Jonathan Savage wrote: Reason for CRM: Patient received a call to confirm his appointment tomorrow at 10:10 AM, and accidentally hit the option to cancel. Patient will be attending his appointment. Please do not cancel the appointment.

## 2023-03-04 ENCOUNTER — Encounter (HOSPITAL_BASED_OUTPATIENT_CLINIC_OR_DEPARTMENT_OTHER): Payer: Self-pay | Admitting: Family Medicine

## 2023-03-04 ENCOUNTER — Ambulatory Visit (INDEPENDENT_AMBULATORY_CARE_PROVIDER_SITE_OTHER): Payer: Medicare Other | Admitting: Family Medicine

## 2023-03-04 VITALS — BP 138/66 | HR 73 | Ht 65.0 in | Wt 142.3 lb

## 2023-03-04 DIAGNOSIS — I10 Essential (primary) hypertension: Secondary | ICD-10-CM | POA: Diagnosis not present

## 2023-03-04 DIAGNOSIS — R7303 Prediabetes: Secondary | ICD-10-CM

## 2023-03-04 DIAGNOSIS — E782 Mixed hyperlipidemia: Secondary | ICD-10-CM

## 2023-03-04 DIAGNOSIS — R200 Anesthesia of skin: Secondary | ICD-10-CM | POA: Diagnosis not present

## 2023-03-04 DIAGNOSIS — N1831 Chronic kidney disease, stage 3a: Secondary | ICD-10-CM

## 2023-03-04 DIAGNOSIS — R202 Paresthesia of skin: Secondary | ICD-10-CM

## 2023-03-04 NOTE — Patient Instructions (Signed)
 Please take your Rosuvastatin (Crestor) every other day for 1 week . If sensation changes in your feet do not improve, please let me know and call the office or send a message online.   Monitor your blood pressure: keep a log of your blood pressure daily. Goal is less than 150/80 but not lower than 110/70. If are having dizziness with medicine, let me know.

## 2023-03-04 NOTE — Progress Notes (Signed)
 Subjective:   Jonathan Jonathan Savage, Jonathan Jonathan Savage 03/04/2023  Chief Complaint  Patient presents with   Medical Management of Chronic Issues    51-month follow up; states that his legs are still bothering him and occasionally will have problems with fatigue.    HPI: Jonathan Jonathan Savage is a 83 yo male with hx of CKD, HTN, prediabetes, erectile dysfunction who presents today for re-assessment and management of chronic medical conditions.  HYPERTENSION: Jonathan Jonathan Savage presents for the medical management of hypertension.  Patient's current hypertension medication regimen is: Amlodipine 5mg  BID, Hydralazine 25mg  BID, Irbesartan 150 (225mg  total) daily Patient is  currently taking prescribed medications for HTN.  Patient is  regularly keeping a check on BP at home.  Adhering to low sodium diet: Yes Exercising Regularly: Yes Denies headache, dizziness, CP, SHOB, vision changes.   BP Readings from Last 3 Encounters:  03/04/23 138/66  11/19/22 120/70  11/04/22 132/70   IMPAIRED FASTING GLUCOSE Jonathan Jonathan Savage is here for medical management of impaired fasting glucose.  Patient's current IFG medication regimen is: Diet and Exercise. Patient has had stable A1C.  Adhering to a diabetic diet: Yes Exercising Regularly: Yes Denies polydipsia, polyphagia, polyuria.  Lab Results  Component Value Date   HGBA1C 6.0 (H) 10/18/2022    Wt Readings from Last 3 Encounters:  03/04/23 142 lb 4.8 oz (64.5 kg)  11/19/22 130 lb 14.4 oz (59.4 kg)  11/04/22 135 lb (61.2 kg)    HYPERLIPIDEMIA: Jonathan Jonathan Savage presents for the medical management of hyperlipidemia.  Patient's current HLD regimen is: Rosuvastatin 10mg   Patient is  currently taking prescribed medications for HLD.  Adhering to heathy diet: Yes Exercising regularly: Yes Denies myalgias.  Lab Results  Component Value Date   CHOL 130 05/03/2022   HDL 51 05/03/2022   LDLCALC 64 05/03/2022   TRIG 76 05/03/2022    CHOLHDL 2.5 05/03/2022   CHRONIC KIDNEY DISEASE: Jonathan Jonathan Savage presents for the medical management of Chronic Kidney Disease.  Patient is  adhering to renal diet. Patient is on ACE1/ARB therapy. (Irbesartan) Patient is  avoiding NSAIDS.     Does not attend dialysis and does not perform peritoneal dialysis.   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 He states there are times that the sensation that "his feet are not his". He states the sensation is similar to an extremity falling asleep and awakening. He was prescribed Gabapentin to try but was not aware of the medication to try. Denies swelling to lower extremities.    The following portions of the patient's history were reviewed and updated as appropriate: past medical history, past surgical history, family history, social history, allergies, medications, and problem list.   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) 06/08/2019   Essential hypertension 06/05/2019   Hyperlipidemia 06/05/2019   Carotid bruit 06/05/2019   Past Medical History:  Diagnosis Date   Carotid artery occlusion    Complication of anesthesia    pt states following last surgery he was awake for over 24 hours following surgery    Hypertension    Stroke Jonathan Jonathan Savage)    Past Surgical History:  Procedure Laterality Date   ENDARTERECTOMY Left 8/Jonathan Savage/2021   Procedure: LEFT CAROTID ENDARTERECTOMY WITH BOVIE PATCH ANGIOPLASTY;  Surgeon: Nada Libman, MD;  Location: [facility] OR;  Service: Vascular;  Laterality: Left;   LAPAROSCOPIC NEPHRECTOMY Right    NECK SURGERY     PROSTATE SURGERY     Family History  Problem Relation Age of Onset   Dementia Mother    Heart failure Father        Deteriration of the heart   Stroke Brother    Outpatient Medications Prior to Visit  Medication Sig Dispense Refill   amLODipine (NORVASC) 5 MG tablet TAKE 1 TABLET (5 MG TOTAL) BY MOUTH IN THE MORNING AND AT BEDTIME 180 tablet 3   aspirin EC 81 MG tablet Take 81 mg by mouth daily.     augmented betamethasone dipropionate (DIPROLENE-AF) 0.05 % cream SMARTSIG:1 Sparingly Topical Twice Daily PRN     hydrALAZINE (APRESOLINE) 25 MG tablet TAKE 1 TABLET BY MOUTH IN THE MORNING AND AT BEDTIME 180 tablet 3   irbesartan (AVAPRO) 150 MG tablet TAKE 1.5 TABLETS (225 MG TOTAL) BY MOUTH DAILY. 135 tablet 2   Omega 3-6-9 Fatty Acids (OMEGA 3-6-9 COMPLEX PO) Take by mouth.     ondansetron (ZOFRAN) 4 MG tablet Take 1 tablet (4 mg total) by mouth every 8 (eight) hours as needed for nausea or vomiting. 20 tablet 0   prednisoLONE acetate (PRED FORTE) 1 % ophthalmic suspension 1 drop.     rosuvastatin (CRESTOR) 10 MG tablet TAKE 1 TABLET BY MOUTH EVERY DAY 90 tablet 2   vitamin B-12 (CYANOCOBALAMIN) 1000 MCG tablet Take 1,000 mcg by mouth daily.     vitamin C (ASCORBIC ACID) 500 MG tablet Take 500 mg by mouth daily.     gabapentin (NEURONTIN) 100 MG capsule Take 1 capsule (100 mg total) by mouth at bedtime as needed (nerve pain). (Patient not taking: Reported on 03/04/2023) 30 capsule 3   sildenafil (VIAGRA) 50 MG tablet Take 0.5 tablets (25 mg total) by mouth daily as needed for erectile dysfunction. (Patient not taking: Reported on 03/04/2023) 10 tablet 0   No  facility-administered medications prior to visit.   Allergies  Allergen Reactions   Hydrochlorothiazide     Other reaction(s): Other (See Comments) Leg pain      ROS: A complete ROS was performed with pertinent positives/negatives noted in the HPI. The remainder of the ROS are negative.    Objective:   Today's Vitals   03/04/23 1016 03/04/23 1040  BP: (!) 140/62 138/66  Pulse: 73   SpO2: 100%   Weight: 142 lb 4.8 oz (64.5 kg)   Height: 5\' 5"  (1.651 m)     Physical Exam          GENERAL: Well-appearing, in NAD. Well nourished.  SKIN: Pink, warm and dry.  Head: Normocephalic. NECK: Trachea midline. Full ROM w/o pain or tenderness.  RESPIRATORY: Chest wall symmetrical. Respirations even and non-labored. Breath sounds clear to auscultation bilaterally.  CARDIAC: S1, S2 present, regular rate and rhythm without murmur  or gallops. Peripheral pulses 2+ bilaterally.  MSK: Muscle tone and strength appropriate for age.  EXTREMITIES: Without clubbing, cyanosis, or edema.  NEUROLOGIC: No motor or sensory deficits. Steady, even gait. C2-C12 intact.  PSYCH/MENTAL STATUS: Alert, oriented x 3. Cooperative, appropriate mood and affect.   Foot Exam:  Visual Inspection: No defomities ulcerations or skin breakdown.  Sensation Testnig: Intact to touch and monofilament testing bilaterally.  Pulse Check: Posterior tibialis and dorsalis pedis intact bilaterally.      Assessment & Plan:  1. CKD stage 3a, GFR 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) (Primary) Stable. Will obtain BMP with labs this morning and assess for anemia due to CKD w/ CBC labs today. Discussed avoidance of nsaids, hydration and diet.  - Basic metabolic panel - CBC with Differential/Platelet  2. Mixed hyperlipidemia Stable. Pt doing well with heart healthy diet. Will make adjustment as described below to see if peripheral neuropathy is due to statin therapy and will update provider if improed.  - Lipid panel  3. Essential  hypertension Stable. Pt recommended to monitor BP and keep a log of values for patient. Will obtain BMP with labs today.  - Basic metabolic panel  4. Numbness and tingling of both lower extremities Sensation intact bilaterally. Will recommend change statin to every other day to see if medication is contributing to neuropathy for 1-2 weeks and notify PCP if improvement occurs. If not, will recommend Gabapentin PRN.   5. Prediabetes Controlled with diet and exercise. Will evaluate A1c with labs today. Discussed healthy diet and exercise with patient.  - Hemoglobin A1c   No orders of the defined types were placed in this encounter.  Lab Orders         Lipid panel         Hemoglobin A1c         Basic metabolic panel         CBC with Differential/Platelet      Return in about 4 months (around 07/02/2023) for Follow up CKD, IFG, HTN, HLD and MWV.    Patient to reach out to office if new, worrisome, or unresolved symptoms arise or if no improvement in patient's condition. Patient verbalized understanding and is agreeable to treatment plan. All questions answered to patient's satisfaction.    Hilbert Bible, [place]

## 2023-03-05 LAB — LIPID PANEL
Chol/HDL Ratio: 2.3 ratio (ref 0.0–5.0)
Cholesterol, Total: 119 mg/dL (ref 100–199)
HDL: 52 mg/dL (ref >39)
LDL Chol Calc (NIH): 54 mg/dL (ref 0–99)
Triglycerides: 60 mg/dL (ref 0–149)
VLDL Cholesterol Cal: 13 mg/dL (ref 5–40)

## 2023-03-05 LAB — CBC WITH DIFFERENTIAL/PLATELET
Basophils Absolute: 0.1 10*3/uL (ref 0.0–0.2)
Basos: 1 %
EOS (ABSOLUTE): 0.4 10*3/uL (ref 0.0–0.4)
Eos: 5 %
Hematocrit: 40.3 % (ref 37.5–51.0)
Hemoglobin: 13.1 g/dL (ref 13.0–17.7)
Immature Grans (Abs): 0 10*3/uL (ref 0.0–0.1)
Immature Granulocytes: 0 %
Lymphocytes Absolute: 1.2 10*3/uL (ref 0.7–3.1)
Lymphs: 16 %
MCH: 29.8 pg (ref 26.6–33.0)
MCHC: 32.5 g/dL (ref 31.5–35.7)
MCV: 92 fL (ref 79–97)
Monocytes Absolute: 0.9 10*3/uL (ref 0.1–0.9)
Monocytes: 12 %
Neutrophils Absolute: 4.7 10*3/uL (ref 1.4–7.0)
Neutrophils: 66 %
Platelets: 280 10*3/uL (ref 150–450)
RBC: 4.39 x10E6/uL (ref 4.14–5.80)
RDW: 12.2 % (ref 11.6–15.4)
WBC: 7.2 10*3/uL (ref 3.4–10.8)

## 2023-03-05 LAB — HEMOGLOBIN A1C
Est. average glucose Bld gHb Est-mCnc: 120 mg/dL
Hgb A1c MFr Bld: 5.8 % — ABNORMAL HIGH (ref 4.8–5.6)

## 2023-03-05 LAB — BASIC METABOLIC PANEL
BUN/Creatinine Ratio: 20 (ref 10–24)
BUN: 29 mg/dL — ABNORMAL HIGH (ref 8–27)
CO2: 25 mmol/L (ref 20–29)
Calcium: 10 mg/dL (ref 8.6–10.2)
Chloride: 101 mmol/L (ref 96–106)
Creatinine, Ser: 1.46 mg/dL — ABNORMAL HIGH (ref 0.76–1.27)
Glucose: 160 mg/dL — ABNORMAL HIGH (ref 70–99)
Potassium: 4.8 mmol/L (ref 3.5–5.2)
Sodium: 140 mmol/L (ref 134–144)
eGFR: 47 mL/min/{1.73_m2} — ABNORMAL LOW (ref >59)

## 2023-03-07 ENCOUNTER — Encounter (HOSPITAL_BASED_OUTPATIENT_CLINIC_OR_DEPARTMENT_OTHER): Payer: Self-pay | Admitting: Family Medicine

## 2023-03-07 NOTE — Progress Notes (Signed)
 Hi Jonathan Savage, Your A1c has improved from 4 months ago and is down from 6.0 to down to 5.8.  Keep up the good exercise and diet changes.  Your kidney function is stable.  Please continue to avoid NSAIDs such as ibuprofen, naproxen and continue clear fluids.  Monitor your protein intake.  Your cholesterol is also stable.  Your blood counts are normal.  No signs of anemia present.  We will plan to recheck your levels in 4 months at your next appointment

## 2023-03-15 ENCOUNTER — Ambulatory Visit: Payer: Medicare Other | Attending: Nurse Practitioner | Admitting: Nurse Practitioner

## 2023-03-15 ENCOUNTER — Encounter: Payer: Self-pay | Admitting: Nurse Practitioner

## 2023-03-15 VITALS — BP 132/60 | HR 62 | Ht 65.0 in | Wt 143.4 lb

## 2023-03-15 DIAGNOSIS — Z8673 Personal history of transient ischemic attack (TIA), and cerebral infarction without residual deficits: Secondary | ICD-10-CM

## 2023-03-15 DIAGNOSIS — I6523 Occlusion and stenosis of bilateral carotid arteries: Secondary | ICD-10-CM | POA: Diagnosis present

## 2023-03-15 DIAGNOSIS — Z9889 Other specified postprocedural states: Secondary | ICD-10-CM | POA: Diagnosis present

## 2023-03-15 DIAGNOSIS — I471 Supraventricular tachycardia, unspecified: Secondary | ICD-10-CM | POA: Diagnosis present

## 2023-03-15 DIAGNOSIS — E785 Hyperlipidemia, unspecified: Secondary | ICD-10-CM | POA: Diagnosis present

## 2023-03-15 DIAGNOSIS — Z87898 Personal history of other specified conditions: Secondary | ICD-10-CM

## 2023-03-15 DIAGNOSIS — I10 Essential (primary) hypertension: Secondary | ICD-10-CM | POA: Diagnosis present

## 2023-03-15 NOTE — Progress Notes (Signed)
 Office Visit    Patient Name: Jonathan Savage [facility] Date of Encounter: 03/15/2023  Primary Care Provider:  Hilbert Bible, FNP Primary Cardiologist:  Nanetta Batty, MD  Chief Complaint    83 year old male with a history of syncope, carotid artery stenosis s/p L CEA in 2021, hypertension, hyperlipidemia, CKD stage IIIa, prediabetes, and stroke who presents for follow-up related to syncope and hypertension.   Past Medical History    Past Medical History:  Diagnosis Date   Carotid artery occlusion    Complication of anesthesia    pt states following last surgery he was awake for over 24 hours following surgery   Hypertension    [facility]roke [facility]    Past Surgical History:  Procedure Laterality Date   ENDARTERECTOMY Left 08/22/2019   Procedure: LEFT CAROTID ENDARTERECTOMY WITH BOVIE PATCH ANGIOPLASTY;  Surgeon: Nada Libman, MD;  Location: [facility] OR;  Service: Vascular;  Laterality: Left;   LAPAROSCOPIC NEPHRECTOMY Right    NECK SURGERY     PROSTATE SURGERY      Allergies  Allergies  Allergen Reactions   Hydrochlorothiazide     Other reaction(s): Other (See Comments) Leg pain      Labs/Other Studies Reviewed    The following studies were reviewed today:  Cardiac Studies & Procedures   ______________________________________________________________________________________________   STRESS TESTS  MYOCARDIAL PERFUSION IMAGING 06/21/2019  Narrative  Horizontal ST segment depression of 1 mm was noted during stress in the II, III and aVF leads.  Nuclear stress EF: 55%.  The left ventricular ejection fraction is normal (55-65%).  Defect 1: There is a medium defect of mild severity present in the mid anteroseptal, apical septal and apex location.  Defect 2: There is a medium defect of mild severity present in the basal inferior, mid inferior and apical inferior location.  Findings consistent with prior myocardial infarction versus artifact. Suspect artifact given  normal wall motion in these areas  This is a low risk study.  Fixed perfusion defects in anteroseptum, apex, and inferior walls, with normal wall motion in these regions, suggestive of artifact.   ECHOCARDIOGRAM  ECHOCARDIOGRAM COMPLETE 04/15/2022  Narrative ECHOCARDIOGRAM REPORT    Patient Name:   Jonathan Savage Date of Exam: 04/15/2022 Medical Rec #:  [id number]            Height:       67.0 in Accession #:    [id number]           Weight:       144.0 lb Date of Birth:  02-Aug-1940            BSA:          1.759 m Patient Age:    81 years             BP:           110/64 mmHg Patient Gender: M                    HR:           76 bpm. Exam Location:  [address]  Procedure: 2D Echo, Cardiac Doppler and Color Doppler  Indications:    R55 Syncope  History:        Patient has no prior history of Echocardiogram examinations. Stroke; Risk Factors:Hypertension and Dyslipidemia.  Sonographer:    Jonathan Savage RCS Referring Phys: [phone] Jonathan Savage C Jonathan Savage  IMPRESSIONS   1. Left ventricular ejection fraction, by estimation, is 60 to 65%.  The left ventricle has normal function. The left ventricle has no regional wall motion abnormalities. There is mild concentric left ventricular hypertrophy. Left ventricular diastolic parameters are consistent with Grade I diastolic dysfunction (impaired relaxation). 2. Right ventricular systolic function is normal. The right ventricular size is normal. Tricuspid regurgitation signal is inadequate for assessing PA pressure. 3. No evidence of mitral valve regurgitation. 4. The aortic valve is grossly normal. Aortic valve regurgitation is not visualized. 5. The inferior vena cava is normal in size with greater than 50% respiratory variability, suggesting right atrial pressure of 3 mmHg.  Comparison(s): No prior Echocardiogram.  FINDINGS Left Ventricle: Left ventricular ejection fraction, by estimation, is 60 to 65%. The left ventricle has normal  function. The left ventricle has no regional wall motion abnormalities. The left ventricular internal cavity size was normal in size. There is mild concentric left ventricular hypertrophy. Left ventricular diastolic parameters are consistent with Grade I diastolic dysfunction (impaired relaxation).  Right Ventricle: The right ventricular size is normal. Right ventricular systolic function is normal. Tricuspid regurgitation signal is inadequate for assessing PA pressure.  Left Atrium: Left atrial size was normal in size.  Right Atrium: Right atrial size was normal in size.  Pericardium: There is no evidence of pericardial effusion.  Mitral Valve: No evidence of mitral valve regurgitation.  Tricuspid Valve: Tricuspid valve regurgitation is not demonstrated.  Aortic Valve: The aortic valve is grossly normal. Aortic valve regurgitation is not visualized.  Pulmonic Valve: Pulmonic valve regurgitation is not visualized.  Aorta: The aortic root and ascending aorta are structurally normal, with no evidence of dilitation.  Venous: The inferior vena cava is normal in size with greater than 50% respiratory variability, suggesting right atrial pressure of 3 mmHg.  IAS/Shunts: The interatrial septum was not well visualized.   LEFT VENTRICLE PLAX 2D LVIDd:         3.50 cm   Diastology LVIDs:         1.60 cm   LV e' medial:    7.07 cm/s LV PW:         1.20 cm   LV E/e' medial:  8.6 LV IVS:        1.20 cm   LV e' lateral:   8.38 cm/s LVOT diam:     2.10 cm   LV E/e' lateral: 7.2 LV SV:         64 LV SV Index:   36 LVOT Area:     3.46 cm   RIGHT VENTRICLE RV Basal diam:  3.80 cm RV S prime:     17.40 cm/s TAPSE (M-mode): 2.1 cm  LEFT ATRIUM             Index        RIGHT ATRIUM           Index LA diam:        3.40 cm 1.93 cm/m   RA Area:     17.00 cm LA Vol (A2C):   35.7 ml 20.30 ml/m  RA Volume:   47.10 ml  26.78 ml/m LA Vol (A4C):   19.1 ml 10.86 ml/m LA Biplane Vol: 29.2 ml  16.60 ml/m AORTIC VALVE LVOT Vmax:   98.30 cm/s LVOT Vmean:  58.800 cm/s LVOT VTI:    0.185 m  AORTA Ao Root diam: 3.70 cm Ao Asc diam:  3.40 cm  MITRAL VALVE MV Area (PHT): 2.55 cm     SHUNTS MV Decel Time: 298 msec     Systemic  VTI:  0.18 m MV E velocity: 60.70 cm/s   Systemic Diam: 2.10 cm MV A velocity: 100.00 cm/s MV E/A ratio:  0.61  [place] signed by Carolan Clines Signature Date/Time: 04/15/2022/11:55:37 AM    Final    MONITORS  LONG TERM MONITOR-LIVE TELEMETRY (3-14 DAYS) 04/13/2022  Narrative Patch Wear Time:  13 days and 23 hours (2024-03-15T20:08:52-0400 to 2024-03-29T20:07:27-0400)  Patient had a min HR of 50 bpm, max HR of 182 bpm, and avg HR of 71 bpm. Predominant underlying rhythm was Sinus Rhythm. 206 Supraventricular Tachycardia runs occurred, the run with the fastest interval lasting 5 beats with a max rate of 182 bpm, the longest lasting 50 mins 55 secs with an avg rate of 104 bpm. Isolated SVEs were occasional (1.2%, 16951), SVE Couplets were rare (<1.0%, 3398), and SVE Triplets were rare (<1.0%, 491). Isolated VEs were rare (<1.0%, 330), VE Couplets were rare (<1.0%, 14), and VE Triplets were rare (<1.0%, 3).  SR/SB/ST Occasional PVCs, freq PACs. Freq runs of SVT (206 runs, longest 55 min) Needs ROV to discuss       ______________________________________________________________________________________________     Recent Labs: 11/19/2022: ALT 17 03/04/2023: BUN 29; Creatinine, Ser 1.46; Hemoglobin 13.1; Platelets 280; Potassium 4.8; Sodium 140  Recent Lipid Panel    Component Value Date/Time   CHOL 119 03/04/2023 1104   TRIG 60 03/04/2023 1104   HDL 52 03/04/2023 1104   CHOLHDL 2.3 03/04/2023 1104   LDLCALC 54 03/04/2023 1104    History of Present Illness    83 year old male with the above past medical history including syncope, carotid artery stenosis s/p L CEA in 2021, hypertension, hyperlipidemia, CKD stage IIIa,  prediabetes, and stroke.   He has a history of prior stroke with residual left upper and lower extremity weakness.  Carotid Dopplers in 06/2019 showed high-grade LICA stenosis.  He was referred to vascular surgery and underwent successful left CEA in 08/2019.  Preoperative Myoview was low risk.  He was evaluated in the ED on 03/03/2022 in the setting of syncopal episode that resulted in a fall.  He sustained a 4 cm laceration to his head. CT of the head/C-spine was negative for acute abnormality. 14-day ZIO revealed sinus rhythm, sinus bradycardia, sinus tachycardia, occasional PVCs, frequent PACs, frequent runs of SVT.  Echocardiogram showed EF 60 to 65%, no RWMA, G1 DD, normal RV systolic function, no significant valvular abnormalities.  He was transitioned from carvedilol to metoprolol.  Most recent carotid ultrasound in 08/2022 revealed 1 to 39% B ICA stenosis.   He was last seen in office on 09/15/2022 and was doing well from a cardiac standpoint.  He denied any further syncope or presyncope.  BP was stable. Abdominal aortic ultrasound in 12/2022 showed no evidence of abdominal aortic aneurysm.    He presents today for follow-up accompanied by his friend. Since his last visit he has been stable overall from a cardiac standpoint.  He denies any chest pain, palpitations, dizziness, syncope, syncope, dyspnea, edema, PND, orthopnea, weight gain.  His biggest concern is that he continues to have bilateral leg discomfort which he describes as an aching.  He questions whether or not this could be related to his statin.  He took Crestor every other day for approximately a week and did not notice any improvement in his symptoms.  He resumed his daily dosing, his symptoms have continued.  Otherwise, he denies any concerns today.  Home Medications    Current Outpatient Medications  Medication Sig Dispense  Refill   amLODipine (NORVASC) 5 MG tablet TAKE 1 TABLET (5 MG TOTAL) BY MOUTH IN THE MORNING AND AT BEDTIME 180  tablet 3   aspirin EC 81 MG tablet Take 81 mg by mouth daily.     augmented betamethasone dipropionate (DIPROLENE-AF) 0.05 % cream SMARTSIG:1 Sparingly Topical Twice Daily PRN     hydrALAZINE (APRESOLINE) 25 MG tablet TAKE 1 TABLET BY MOUTH IN THE MORNING AND AT BEDTIME 180 tablet 3   irbesartan (AVAPRO) 150 MG tablet TAKE 1.5 TABLETS (225 MG TOTAL) BY MOUTH DAILY. 135 tablet 2   Omega 3-6-9 Fatty Acids (OMEGA 3-6-9 COMPLEX PO) Take by mouth.     rosuvastatin (CRESTOR) 10 MG tablet TAKE 1 TABLET BY MOUTH EVERY DAY 90 tablet 2   vitamin B-12 (CYANOCOBALAMIN) 1000 MCG tablet Take 1,000 mcg by mouth daily.     vitamin C (ASCORBIC ACID) 500 MG tablet Take 500 mg by mouth daily.     No current facility-administered medications for this visit.     Review of Systems    He denies chest pain, palpitations, dyspnea, pnd, orthopnea, n, v, dizziness, syncope, edema, weight gain, or early satiety. All other systems reviewed and are otherwise negative except as noted above.   Physical Exam    VS:  BP 132/60   Pulse 62   Ht 5\' 5"  (1.651 m)   Wt 143 lb 6.4 oz (65 kg)   SpO2 97%   BMI 23.86 kg/m   GEN: Well nourished, well developed, in no acute distress. HEENT: normal. Neck: Supple, no JVD, carotid bruits, or masses. Cardiac: RRR, no murmurs, rubs, or gallops. No clubbing, cyanosis, edema.  Radials/DP/PT 2+ and equal bilaterally.  Respiratory:  Respirations regular and unlabored, clear to auscultation bilaterally. GI: Soft, nontender, nondistended, BS + x 4. MS: no deformity or atrophy. Skin: warm and dry, no rash. Neuro:  Strength and sensation are intact. Psych: Normal affect.  Accessory Clinical Findings    ECG personally reviewed by me today -    - no EKG in office today.    Lab Results  Component Value Date   WBC 7.2 03/04/2023   HGB 13.1 03/04/2023   HCT 40.3 03/04/2023   MCV 92 03/04/2023   PLT 280 03/04/2023   Lab Results  Component Value Date   CREATININE 1.46 (H)  03/04/2023   BUN 29 (H) 03/04/2023   NA 140 03/04/2023   K 4.8 03/04/2023   CL 101 03/04/2023   CO2 25 03/04/2023   Lab Results  Component Value Date   ALT 17 11/19/2022   AST 22 11/19/2022   ALKPHOS 76 11/19/2022   BILITOT 0.4 11/19/2022   Lab Results  Component Value Date   CHOL 119 03/04/2023   HDL 52 03/04/2023   LDLCALC 54 03/04/2023   TRIG 60 03/04/2023   CHOLHDL 2.3 03/04/2023    Lab Results  Component Value Date   HGBA1C 5.8 (H) 03/04/2023    Assessment & Plan    1. History of syncope: Thought to be vasovagal.  He was also borderline hypotensive, bradycardic at the time.  He underwent several medication adjustments including discontinuation of carvedilol and subsequently metoprolol. 14-day ZIO revealed sinus rhythm, sinus bradycardia, sinus tachycardia, occasional PVCs, frequent PACs, frequent runs of SVT.  Echo showed EF 60 to 65%, no RWMA, G1 DD, normal RV systolic function, no significant valvular abnormalities. BP and heart rate have been stable.  Denies any recurrent syncope, presyncope.   2. SVT/PVCs/PACs: 14-day ZIO in  04/2022 revealed sinus rhythm, sinus bradycardia, sinus tachycardia, occasional PVCs, frequent PACs, frequent runs of SVT. He is generally asymptomatic with his SVT.  He did not tolerate beta-blocker therapy in the setting of bradycardia, fatigue.  Continue to monitor symptoms.   3. Hypertension: BP stable. Continue current antihypertensive regimen.    4. Carotid artery stenosis: S/p L CEA in 2021. Most recent carotid ultrasound in 08/2022 revealed 1 to 39% B ICA stenosis.  Follows with vascular surgery.  Continue aspirin, Crestor.   5. Hyperlipidemia: LDL was 54 in 02/2023. He an reports aching in his legs bilaterally, he feels this is likely related to his statin therapy.  He took his Crestor every other day for approximately 1 week but did not notice improvement in his symptoms.  He resumed daily dosing, symptoms have continued. Will trial 2-week  statin holiday.  He will report back on his symptoms.  If symptoms improve, consider dose reduction of rosuvastatin, possible addition of Zetia, versus trial of atorvastatin. Could also consider Pharm.D. referral for consideration of PCSK9 inhibitor.    6. History of CVA: No recurrence. CT of the head in the setting of syncope was negative for acute abnormality. Continue aspirin, Crestor as above.   7. Disposition: Follow-up in 1 year.       Joylene Grapes, NP 03/15/2023, 3:42 PM

## 2023-03-15 NOTE — Patient Instructions (Addendum)
 Medication Instructions:  Hold Crestor for 2 weeks and report your symptoms.   *If you need a refill on your cardiac medications before your next appointment, please call your pharmacy*   Lab Work: NONE ordered at this time of appointment   Testing/Procedures: NONE ordered at this time of appointment   Follow-Up: At [facility], you and your health needs are our priority.  As part of our continuing mission to provide you with exceptional heart care, we have created designated Provider Care Teams.  These Care Teams include your primary Cardiologist (physician) and Advanced Practice Providers (APPs -  Physician Assistants and Nurse Practitioners) who all work together to provide you with the care you need, when you need it.  We recommend signing up for the patient portal called "MyChart".  Sign up information is provided on this After Visit Summary.  MyChart is used to connect with patients for Virtual Visits (Telemedicine).  Patients are able to view lab/test results, encounter notes, upcoming appointments, etc.  Non-urgent messages can be sent to your provider as well.   To learn more about what you can do with MyChart, go to [email or web address].    Your next appointment:   1 year(s)  Provider:   Nanetta Batty, MD     Other Instructions

## 2023-03-31 ENCOUNTER — Telehealth (HOSPITAL_BASED_OUTPATIENT_CLINIC_OR_DEPARTMENT_OTHER): Payer: Self-pay | Admitting: Family Medicine

## 2023-03-31 NOTE — Telephone Encounter (Signed)
Attempted to call pt but unable to reach. Left message to return call.  

## 2023-03-31 NOTE — Telephone Encounter (Signed)
 Pt returned call. States that the pain in the lower extremities has gotten better but states he is still having some problems with his feet.  Routing this information to [place] to see what she has to say.

## 2023-03-31 NOTE — Telephone Encounter (Signed)
 Copied from CRM [id number]. Topic: Clinical - Medical Advice >> Mar 31, 2023 11:03 AM Ivette P wrote: Reason for CRM: pt called in because it has been 1 week and was told to follow up with provider Jerre Simon, FNP about medicaiton rosuvastatin (CRESTOR) 10 MG tablet. Pt wants to go over how he feels and what's going on per her request.   Pt callback [phone]   Does this patient need a followup before the 4 week one suggested?

## 2023-04-04 ENCOUNTER — Other Ambulatory Visit (HOSPITAL_BASED_OUTPATIENT_CLINIC_OR_DEPARTMENT_OTHER): Payer: Self-pay | Admitting: Family Medicine

## 2023-04-04 MED ORDER — PREGABALIN 25 MG PO CAPS: 25.0000 mg | ORAL_CAPSULE | Freq: Two times a day (BID) | ORAL | 2 refills | Status: DC | PRN

## 2023-04-04 NOTE — Telephone Encounter (Signed)
 Called and spoke with pt letting him know the info per [employer]. Pt is okay with having lyrica Rx sent to pharmacy.

## 2023-04-12 ENCOUNTER — Encounter (HOSPITAL_BASED_OUTPATIENT_CLINIC_OR_DEPARTMENT_OTHER): Payer: Medicare Other

## 2023-05-03 ENCOUNTER — Ambulatory Visit (HOSPITAL_BASED_OUTPATIENT_CLINIC_OR_DEPARTMENT_OTHER): Admitting: *Deleted

## 2023-05-03 ENCOUNTER — Encounter (HOSPITAL_BASED_OUTPATIENT_CLINIC_OR_DEPARTMENT_OTHER): Payer: Self-pay

## 2023-05-03 DIAGNOSIS — Z0000 Encounter for general adult medical examination without abnormal findings: Secondary | ICD-10-CM | POA: Diagnosis not present

## 2023-05-03 NOTE — Progress Notes (Signed)
 Subjective:   Jonathan Savage is a 83 y.o. male who presents for Medicare Annual/Subsequent preventive examination.  Visit Complete: Virtual I connected with  Jonathan Savage on 05/03/23 by a audio enabled telemedicine application and verified that I am speaking with the correct person using two identifiers.  Patient Location: Home  Provider Location: Home Office  I discussed the limitations of evaluation and management by telemedicine. The patient expressed understanding and agreed to proceed.  Vital Signs: Because this visit was a virtual/telehealth visit, some criteria may be missing or patient reported. Any vitals not documented were not able to be obtained and vitals that have been documented are patient reported.   Cardiac Risk Factors include: advanced age (>77men, >74 women);male gender;hypertension     Objective:    There were no vitals filed for this visit. There is no height or weight on file to calculate BMI.     05/03/2023    1:59 PM 11/10/2022    8:56 AM 04/06/2022   12:20 PM 03/03/2022   12:09 PM 11/05/2020   11:23 AM 09/25/2020    1:09 PM 08/25/2020    1:06 PM  Advanced Directives  Does Patient Have a Medical Advance Directive? Yes Yes Yes Yes Yes No Yes  Type of Estate agent of [employer] Power of [place];Living will Healthcare Power of [place];Living will Healthcare Power of [place];Living will Healthcare Power of [place];Living will  Healthcare Power of [place];Living will;Out of facility DNR (pink MOST or yellow form)  Does patient want to make changes to medical advance directive?  No - Patient declined  No - Patient declined No - Patient declined    Copy of Healthcare Power of Attorney in Chart? Yes - validated most recent copy scanned in chart (See row information) No - copy requested No - copy requested No - copy requested, Physician notified       Current Medications (verified) Outpatient Encounter Medications  as of 05/03/2023  Medication Sig   amLODipine  (NORVASC ) 5 MG tablet TAKE 1 TABLET (5 MG TOTAL) BY MOUTH IN THE MORNING AND AT BEDTIME   aspirin  EC 81 MG tablet Take 81 mg by mouth daily.   augmented betamethasone dipropionate (DIPROLENE-AF) 0.05 % cream SMARTSIG:1 Sparingly Topical Twice Daily PRN   hydrALAZINE  (APRESOLINE ) 25 MG tablet TAKE 1 TABLET BY MOUTH IN THE MORNING AND AT BEDTIME   irbesartan  (AVAPRO ) 150 MG tablet TAKE 1.5 TABLETS (225 MG TOTAL) BY MOUTH DAILY.   Omega 3-6-9 Fatty Acids (OMEGA 3-6-9 COMPLEX PO) Take by mouth.   pregabalin  (LYRICA ) 25 MG capsule Take 1 capsule (25 mg total) by mouth 2 (two) times daily as needed (lower extremity pain).   rosuvastatin  (CRESTOR ) 10 MG tablet TAKE 1 TABLET BY MOUTH EVERY DAY   vitamin B-12 (CYANOCOBALAMIN) 1000 MCG tablet Take 1,000 mcg by mouth daily.   vitamin C (ASCORBIC ACID) 500 MG tablet Take 500 mg by mouth daily.   No facility-administered encounter medications on file as of 05/03/2023.    Allergies (verified) Hydrochlorothiazide   History: Past Medical History:  Diagnosis Date   Carotid artery occlusion    Complication of anesthesia    pt states following last surgery he was awake for over 24 hours following surgery   Hypertension    Stroke [facility])    Past Surgical History:  Procedure Laterality Date   ENDARTERECTOMY Left 08/22/2019   Procedure: LEFT CAROTID ENDARTERECTOMY WITH BOVIE PATCH ANGIOPLASTY;  Surgeon: Margherita Shell, MD;  Location: [facility] OR;  Service: Vascular;  Laterality: Left;   LAPAROSCOPIC NEPHRECTOMY Right    NECK SURGERY     PROSTATE SURGERY     Family History  Problem Relation Age of Onset   Dementia Mother    Heart failure Father        Deteriration of the heart   Stroke Brother    Social History   Socioeconomic History   Marital status: Widowed    Spouse name: Not on file   Number of children: 3   Years of education: Not on file   Highest education level: Not on file  Occupational  History   Not on file  Tobacco Use   Smoking status: Former   Smokeless tobacco: Never  Vaping Use   Vaping status: Never Used  Substance and Sexual Activity   Alcohol use: Never   Drug use: Never   Sexual activity: Not Currently  Other Topics Concern   Not on file  Social History Narrative   Right Handed    Lives in a two story home    Father to 3 girls    Social Drivers of Corporate investment banker Strain: Low Risk  (05/03/2023)   Overall Financial Resource Strain (CARDIA)    Difficulty of Paying Living Expenses: Not hard at all  Food Insecurity: No Food Insecurity (05/03/2023)   Hunger Vital Sign    Worried About Running Out of Food in the Last Year: Never true    Ran Out of Food in the Last Year: Never true  Transportation Needs: No Transportation Needs (05/03/2023)   PRAPARE - Administrator, Civil Service (Medical): No    Lack of Transportation (Non-Medical): No  Physical Activity: Insufficiently Active (05/03/2023)   Exercise Vital Sign    Days of Exercise per Week: 2 days    Minutes of Exercise per Session: 40 min  Stress: No Stress Concern Present (05/03/2023)   [employer] of Occupational Health - Occupational Stress Questionnaire    Feeling of Stress : Not at all  Social Connections: Socially Integrated (05/03/2023)   Social Connection and Isolation Panel [NHANES]    Frequency of Communication with Friends and Family: More than three times a week    Frequency of Social Gatherings with Friends and Family: More than three times a week    Attends Religious Services: More than 4 times per year    Active Member of [employer] or Organizations: Yes    Attends Engineer, structural: More than 4 times per year    Marital Status: Married    Tobacco Counseling Counseling given: Not Answered   Clinical Intake:  Pre-visit preparation completed: Yes  Pain : No/denies pain     Diabetes: No  How often do you need to have someone help you when  you read instructions, pamphlets, or other written materials from your doctor or pharmacy?: 1 - Never  Interpreter Needed?: No  Information entered by :: Kieth Pelt LPN   Activities of Daily Living    05/03/2023    2:00 PM  In your present state of health, do you have any difficulty performing the following activities:  Hearing? 1  Vision? 0  Difficulty concentrating or making decisions? 0  Walking or climbing stairs? 1  Dressing or bathing? 0  Doing errands, shopping? 0  Preparing Food and eating ? N  Using the Toilet? N  In the past six months, have you accidently leaked urine? N  Do you have problems with loss of  bowel control? N  Managing your Medications? N  Managing your Finances? N    Patient Care Team: Caudle, Arcola Kocher, FNP as PCP - General (Family Medicine) Avanell Leigh, MD as PCP - Cardiology (Cardiology) Patel, Donika K, DO as Consulting Physician (Neurology)  Indicate any recent Medical Services you may have received from other than [facility] providers in the past year (date may be approximate).     Assessment:   This is a routine wellness examination for [employer].  Hearing/Vision screen Hearing Screening - Comments:: Deaf in one ear Does not wear hearing Vision Screening - Comments:: Up to date Looking for a new eye doctor   Goals Addressed   None    Depression Screen    05/03/2023    2:04 PM 03/04/2023   10:21 AM 11/19/2022   11:14 AM 08/04/2022    3:50 PM 07/21/2022    9:24 AM 06/21/2022    9:32 AM 04/06/2022   12:16 PM  PHQ 2/9 Scores  PHQ - 2 Score 0 0 0 0 0 0 0  PHQ- 9 Score 2 0  0 1 1   Exception Documentation    Medical reason  Medical reason     Fall Risk    05/03/2023    1:52 PM 03/04/2023   10:21 AM 11/19/2022   11:14 AM 08/04/2022    3:50 PM 06/21/2022    9:32 AM  Fall Risk   Falls in the past year? 0 0 0 1 1  Number falls in past yr: 0 0 0 0 0  Injury with Fall? 0 0 0 0 1  Risk for fall due to :  No Fall Risks No Fall Risks  No Fall Risks No Fall Risks  Follow up Falls evaluation completed;Education provided;Falls prevention discussed Falls evaluation completed Falls evaluation completed Falls evaluation completed Falls evaluation completed    MEDICARE RISK AT HOME: Medicare Risk at Home Any stairs in or around the home?: No If so, are there any without handrails?: No Home free of loose throw rugs in walkways, pet beds, electrical cords, etc?: Yes Adequate lighting in your home to reduce risk of falls?: Yes Life alert?: No Use of a cane, walker or w/c?: No Grab bars in the bathroom?: Yes Shower chair or bench in shower?: No Elevated toilet seat or a handicapped toilet?: Yes  TIMED UP AND GO:  Was the test performed?  No    Cognitive Function:        05/03/2023    2:00 PM 04/06/2022   12:20 PM  6CIT Screen  What Year? 0 points 0 points  What month? 0 points 0 points  What time? 0 points 0 points  Count back from 20 0 points 0 points  Months in reverse 4 points 4 points  Repeat phrase 2 points 0 points  Total Score 6 points 4 points    Immunizations Immunization History  Administered Date(s) Administered   Influenza, High Dose Seasonal PF 10/01/2022   Influenza-Unspecified 10/21/2020   Moderna Sars-Covid-2 Vaccination 11/07/2019, 10/29/2020   Tdap 03/03/2022    TDAP status: Up to date  Flu Vaccine status: Up to date  Pneumococcal vaccine status: Up to date  Covid-19 vaccine status: Information provided on how to obtain vaccines.   Qualifies for Shingles Vaccine? Yes   Zostavax completed No   Shingrix Completed?: No.    Education has been provided regarding the importance of this vaccine. Patient has been advised to call insurance company to determine out of  pocket expense if they have not yet received this vaccine. Advised may also receive vaccine at local pharmacy or Health Dept. Verbalized acceptance and understanding.  Screening Tests Health Maintenance  Topic Date Due    Pneumonia Vaccine 56+ Years old (1 of 2 - PCV) Never done   Zoster Vaccines- Shingrix (1 of 2) Never done   COVID-19 Vaccine (3 - 2024-25 season) 09/05/2022   INFLUENZA VACCINE  08/05/2023   Medicare Annual Wellness (AWV)  05/02/2024   DTaP/Tdap/Td (2 - Td or Tdap) 03/03/2032   HPV VACCINES  Aged Out   Meningococcal B Vaccine  Aged Out    Health Maintenance  Health Maintenance Due  Topic Date Due   Pneumonia Vaccine 48+ Years old (1 of 2 - PCV) Never done   Zoster Vaccines- Shingrix (1 of 2) Never done   COVID-19 Vaccine (3 - 2024-25 season) 09/05/2022    Colorectal cancer screening: No longer required.   Lung Cancer Screening: (Low Dose CT Chest recommended if Age 69-80 years, 20 pack-year currently smoking OR have quit w/in 15years.) does not qualify.   Lung Cancer Screening Referral:   Additional Screening:  Hepatitis C Screening: does not qualify;  Vision Screening: Recommended annual ophthalmology exams for early detection of glaucoma and other disorders of the eye. Is the patient up to date with their annual eye exam?  No  Who is the provider or what is the name of the office in which the patient attends annual eye exams? Is looking for a new MD If pt is not established with a provider, would they like to be referred to a provider to establish care? No .   Dental Screening: Recommended annual dental exams for proper oral hygiene    Community Resource Referral / Chronic Care Management: CRR required this visit?  No   CCM required this visit?  No     Plan:     I have personally reviewed and noted the following in the patient's chart:   Medical and social history Use of alcohol, tobacco or illicit drugs  Current medications and supplements including opioid prescriptions. Patient is not currently taking opioid prescriptions. Functional ability and status Nutritional status Physical activity Advanced directives List of other physicians Hospitalizations,  surgeries, and ER visits in previous 12 months Vitals Screenings to include cognitive, depression, and falls Referrals and appointments  In addition, I have reviewed and discussed with patient certain preventive protocols, quality metrics, and best practice recommendations. A written personalized care plan for preventive services as well as general preventive health recommendations were provided to patient.     Kieth Pelt, LPN   5/62/1308   After Visit Summary: (MyChart) Due to this being a telephonic visit, the after visit summary with patients personalized plan was offered to patient via MyChart   Nurse Notes:

## 2023-05-03 NOTE — Patient Instructions (Signed)
 Jonathan Savage , Thank you for taking time to come for your Medicare Wellness Visit. I appreciate your ongoing commitment to your health goals. Please review the following plan we discussed and let me know if I can assist you in the future.   Screening recommendations/referrals: Colonoscopy: no longer required Recommended yearly ophthalmology/optometry visit for glaucoma screening and checkup Recommended yearly dental visit for hygiene and checkup  Vaccinations: Influenza vaccine:  Pneumococcal vaccine:  Tdap vaccine:  Shingles vaccine:      Preventive Care 65 Years and Older, Male Preventive care refers to lifestyle choices and visits with your health care provider that can promote health and wellness. What does preventive care include? A yearly physical exam. This is also called an annual well check. Dental exams once or twice a year. Routine eye exams. Ask your health care provider how often you should have your eyes checked. Personal lifestyle choices, including: Daily care of your teeth and gums. Regular physical activity. Eating a healthy diet. Avoiding tobacco and drug use. Limiting alcohol use. Practicing safe sex. Taking low doses of aspirin  every day. Taking vitamin and mineral supplements as recommended by your health care provider. What happens during an annual well check? The services and screenings done by your health care provider during your annual well check will depend on your age, overall health, lifestyle risk factors, and family history of disease. Counseling  Your health care provider may ask you questions about your: Alcohol use. Tobacco use. Drug use. Emotional well-being. Home and relationship well-being. Sexual activity. Eating habits. History of falls. Memory and ability to understand (cognition). Work and work Astronomer. Screening  You may have the following tests or measurements: Height, weight, and BMI. Blood pressure. Lipid and cholesterol  levels. These may be checked every 5 years, or more frequently if you are over 33 years old. Skin check. Lung cancer screening. You may have this screening every year starting at age 98 if you have a 30-pack-year history of smoking and currently smoke or have quit within the past 15 years. Fecal occult blood test (FOBT) of the stool. You may have this test every year starting at age 18. Flexible sigmoidoscopy or colonoscopy. You may have a sigmoidoscopy every 5 years or a colonoscopy every 10 years starting at age 31. Prostate cancer screening. Recommendations will vary depending on your family history and other risks. Hepatitis C blood test. Hepatitis B blood test. Sexually transmitted disease (STD) testing. Diabetes screening. This is done by checking your blood sugar (glucose) after you have not eaten for a while (fasting). You may have this done every 1-3 years. Abdominal aortic aneurysm (AAA) screening. You may need this if you are a current or former smoker. Osteoporosis. You may be screened starting at age 42 if you are at high risk. Talk with your health care provider about your test results, treatment options, and if necessary, the need for more tests. Vaccines  Your health care provider may recommend certain vaccines, such as: Influenza vaccine. This is recommended every year. Tetanus, diphtheria, and acellular pertussis (Tdap, Td) vaccine. You may need a Td booster every 10 years. Zoster vaccine. You may need this after age 29. Pneumococcal 13-valent conjugate (PCV13) vaccine. One dose is recommended after age 32. Pneumococcal polysaccharide (PPSV23) vaccine. One dose is recommended after age 45. Talk to your health care provider about which screenings and vaccines you need and how often you need them. This information is not intended to replace advice given to you by your health  care provider. Make sure you discuss any questions you have with your health care provider. Document  Released: 01/17/2015 Document Revised: 09/10/2015 Document Reviewed: 10/22/2014 Elsevier Interactive Patient Education  2017 [employer].  Fall Prevention in the Home Falls can cause injuries. They can happen to people of all ages. There are many things you can do to make your home safe and to help prevent falls. What can I do on the outside of my home? Regularly fix the edges of walkways and driveways and fix any cracks. Remove anything that might make you trip as you walk through a door, such as a raised step or threshold. Trim any bushes or trees on the path to your home. Use bright outdoor lighting. Clear any walking paths of anything that might make someone trip, such as rocks or tools. Regularly check to see if handrails are loose or broken. Make sure that both sides of any steps have handrails. Any raised decks and porches should have guardrails on the edges. Have any leaves, snow, or ice cleared regularly. Use sand or salt on walking paths during winter. Clean up any spills in your garage right away. This includes oil or grease spills. What can I do in the bathroom? Use night lights. Install grab bars by the toilet and in the tub and shower. Do not use towel bars as grab bars. Use non-skid mats or decals in the tub or shower. If you need to sit down in the shower, use a plastic, non-slip stool. Keep the floor dry. Clean up any water that spills on the floor as soon as it happens. Remove soap buildup in the tub or shower regularly. Attach bath mats securely with double-sided non-slip rug tape. Do not have throw rugs and other things on the floor that can make you trip. What can I do in the bedroom? Use night lights. Make sure that you have a light by your bed that is easy to reach. Do not use any sheets or blankets that are too big for your bed. They should not hang down onto the floor. Have a firm chair that has side arms. You can use this for support while you get dressed. Do  not have throw rugs and other things on the floor that can make you trip. What can I do in the kitchen? Clean up any spills right away. Avoid walking on wet floors. Keep items that you use a lot in easy-to-reach places. If you need to reach something above you, use a strong step stool that has a grab bar. Keep electrical cords out of the way. Do not use floor polish or wax that makes floors slippery. If you must use wax, use non-skid floor wax. Do not have throw rugs and other things on the floor that can make you trip. What can I do with my stairs? Do not leave any items on the stairs. Make sure that there are handrails on both sides of the stairs and use them. Fix handrails that are broken or loose. Make sure that handrails are as long as the stairways. Check any carpeting to make sure that it is firmly attached to the stairs. Fix any carpet that is loose or worn. Avoid having throw rugs at the top or bottom of the stairs. If you do have throw rugs, attach them to the floor with carpet tape. Make sure that you have a light switch at the top of the stairs and the bottom of the stairs. If you do not  have them, ask someone to add them for you. What else can I do to help prevent falls? Wear shoes that: Do not have high heels. Have rubber bottoms. Are comfortable and fit you well. Are closed at the toe. Do not wear sandals. If you use a stepladder: Make sure that it is fully opened. Do not climb a closed stepladder. Make sure that both sides of the stepladder are locked into place. Ask someone to hold it for you, if possible. Clearly mark and make sure that you can see: Any grab bars or handrails. First and last steps. Where the edge of each step is. Use tools that help you move around (mobility aids) if they are needed. These include: Canes. Walkers. Scooters. Crutches. Turn on the lights when you go into a dark area. Replace any light bulbs as soon as they burn out. Set up your  furniture so you have a clear path. Avoid moving your furniture around. If any of your floors are uneven, fix them. If there are any pets around you, be aware of where they are. Review your medicines with your doctor. Some medicines can make you feel dizzy. This can increase your chance of falling. Ask your doctor what other things that you can do to help prevent falls. This information is not intended to replace advice given to you by your health care provider. Make sure you discuss any questions you have with your health care provider. Document Released: 10/17/2008 Document Revised: 05/29/2015 Document Reviewed: 01/25/2014 Elsevier Interactive Patient Education  2017 [employer].

## 2023-05-09 ENCOUNTER — Encounter (HOSPITAL_BASED_OUTPATIENT_CLINIC_OR_DEPARTMENT_OTHER): Payer: Self-pay | Admitting: Family Medicine

## 2023-05-09 ENCOUNTER — Ambulatory Visit (HOSPITAL_BASED_OUTPATIENT_CLINIC_OR_DEPARTMENT_OTHER): Admitting: Family Medicine

## 2023-05-09 VITALS — BP 126/58 | HR 67 | Resp 16 | Ht 65.0 in | Wt 140.7 lb

## 2023-05-09 DIAGNOSIS — I952 Hypotension due to drugs: Secondary | ICD-10-CM | POA: Diagnosis not present

## 2023-05-09 DIAGNOSIS — G5793 Unspecified mononeuropathy of bilateral lower limbs: Secondary | ICD-10-CM | POA: Diagnosis not present

## 2023-05-09 NOTE — Progress Notes (Signed)
 Subjective:   Jonathan Savage [birth year] 05/09/2023  Chief Complaint  Patient presents with   Hypertension   Hyperlipidemia    Stopped cholesterol med for a bit now back on it.     HPI: Jonathan Savage presents today for re-assessment and management of chronic medical conditions.  Patient reports ongoing neuropathy to bilateral feet and "states feet are very sensitive". He states numbness and tingling improves with removal of his shoes. He denies discoloration, swelling or sensation in temperature. His previous foot exam was unremarkable for signs sensation changes, discoloration or deformity to his feet. His last A1C was 5.8 in February 2025. Patient states he tried 1 tablet of Lyrica  and felt "off and not clearheaded.". He is unable to recall if nerve pain improved when taking Lyrica .   He has gone up a size in his footwear to see if that makes a difference. Patient states he was advised by cardiology to trial a break from his statin therapy to see if lower extremity pain would improve. He noticed small improvement in lower extremity pain but has restarted his statin due to concern of HLD.    BP:  Patient states he has been having dizziness and near syncopal episode in the past week when waking up.He reports dizziness occurs with movement and changing positions from lying to sitting. He reports BP has been consistently systolic 120's and diastolic in the 50's. Denies chest pain, SHOB, palpitations.   He is currently taking Amlodipine  5mg  BID, Hydralazine  25mg  BID, Irbesartan  225mg  daily (1.5 tab). Eithen states he decreased his Irbesartan  to 1 and 1/4 tablet daily and felt improvement in dizziness before returning to prescribed dose.   Wt Readings from Last 3 Encounters:  05/09/23 140 lb 11.2 oz (63.8 kg)  03/15/23 143 lb 6.4 oz (65 kg)  03/04/23 142 lb 4.8 oz (64.5 kg)   BP Readings from Last 3 Encounters:  05/09/23 (!) 126/58  03/15/23 132/60  03/04/23 138/66        The following portions of the patient's history were reviewed and updated as appropriate: past medical history, past surgical history, family history, social history, allergies, medications, and problem list.   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) 06/08/2019   Essential hypertension 06/05/2019   Hyperlipidemia 06/05/2019   Carotid bruit 06/05/2019   Past Medical History:  Diagnosis Date   Carotid artery occlusion    Complication of anesthesia    pt states following last surgery he was awake for over 24 hours following surgery   Hypertension    Stroke [facility])    Past Surgical History:  Procedure Laterality Date   ENDARTERECTOMY Left 08/22/2019   Procedure: LEFT CAROTID ENDARTERECTOMY WITH BOVIE PATCH ANGIOPLASTY;  Surgeon: Margherita Shell, MD;  Location: [facility] OR;  Service: Vascular;  Laterality: Left;   LAPAROSCOPIC NEPHRECTOMY Right    NECK SURGERY     PROSTATE SURGERY     Family History  Problem Relation Age of Onset   Dementia Mother    Heart failure Father  Deteriration of the heart   Stroke Brother    Outpatient Medications Prior to Visit  Medication Sig Dispense Refill   amLODipine  (NORVASC ) 5 MG tablet TAKE 1 TABLET (5 MG TOTAL) BY MOUTH IN THE MORNING AND AT BEDTIME 180 tablet 3   aspirin  EC 81 MG tablet Take 81 mg by mouth daily.     augmented betamethasone dipropionate (DIPROLENE-AF) 0.05 % cream SMARTSIG:1 Sparingly Topical Twice Daily PRN     hydrALAZINE  (APRESOLINE ) 25 MG tablet TAKE 1 TABLET BY MOUTH IN THE  MORNING AND AT BEDTIME 180 tablet 3   irbesartan  (AVAPRO ) 150 MG tablet TAKE 1.5 TABLETS (225 MG TOTAL) BY MOUTH DAILY. 135 tablet 2   Omega 3-6-9 Fatty Acids (OMEGA 3-6-9 COMPLEX PO) Take by mouth.     rosuvastatin  (CRESTOR ) 10 MG tablet TAKE 1 TABLET BY MOUTH EVERY DAY 90 tablet 2   vitamin B-12 (CYANOCOBALAMIN) 1000 MCG tablet Take 1,000 mcg by mouth daily.     vitamin C (ASCORBIC ACID) 500 MG tablet Take 500 mg by mouth daily.     pregabalin  (LYRICA ) 25 MG capsule Take 1 capsule (25 mg total) by mouth 2 (two) times daily as needed (lower extremity pain). (Patient not taking: Reported on 05/09/2023) 60 capsule 2   No facility-administered medications prior to visit.   Allergies  Allergen Reactions   Hydrochlorothiazide     Other reaction(s): Other (See Comments) Leg pain      ROS: A complete ROS was performed with pertinent positives/negatives noted in the HPI. The remainder of the ROS are negative.    Objective:   Today's Vitals   05/09/23 1519 05/09/23 1530  BP: (!) 123/54 (!) 126/58  Pulse: 67   Resp: 16   Weight: 140 lb 11.2 oz (63.8 kg)   Height: 5\' 5"  (1.651 m)   PainSc: 4    PainLoc: Leg     Physical Exam          GENERAL: Well-appearing, in NAD. Well nourished.  SKIN: Pink, warm and dry.  Head: Normocephalic. NECK: Trachea midline. Full ROM w/o pain or tenderness. RESPIRATORY: Chest wall symmetrical. Respirations even and non-labored. Breath sounds clear to auscultation bilaterally.  CARDIAC: S1, S2 present, regular rate and rhythm without murmur or gallops. Peripheral pulses 2+ bilaterally. Carotid arteries without bruit or thrill.  MSK: Muscle tone and strength appropriate for age. NEUROLOGIC: No motor or sensory deficits. Steady, even gait. C2-C12 intact.  PSYCH/MENTAL STATUS: Alert, oriented x 3. Cooperative, appropriate mood and affect.     Assessment & Plan:  1. Neuropathy of both feet (Primary) Discussed symptoms of neuropathy and possible causes.  Patient did not tolerate Lyrica  well and likely would not be a candidate for trial of gabapentin  due to side effects. No signs of claudication. Given worsening with shoes, recommend discussing diabetic fitted shoe and or custom orthotic insert with Podiatry. Patient agreeable and referral placed.  - Ambulatory referral to Podiatry  2. Hypotension due to drugs Possibly due to weight loss and increased exercise in the past 3 months. Recommend patient decrease his Irbesartan  to 150mg  and monitor his BP and side effects with log.Patient states he will cut down to 212.5mg  and then down to 150mg  if BP is still less than 130/80.   Return in about 2 months (around 07/09/2023) for Follow up Chronic Conditions (2-3 months) .    Patient to reach out to office if new, worrisome, or unresolved symptoms arise or if no improvement in patient's condition. Patient verbalized understanding  and is agreeable to treatment plan. All questions answered to patient's satisfaction.    Nonda Bays, [place]

## 2023-05-09 NOTE — Patient Instructions (Signed)
 Irbesartan  : Take 1 and 1/4 tablet daily   Monitor your blood pressure daily and keep a log.

## 2023-05-16 ENCOUNTER — Ambulatory Visit (INDEPENDENT_AMBULATORY_CARE_PROVIDER_SITE_OTHER): Admitting: Podiatry

## 2023-05-16 ENCOUNTER — Encounter: Payer: Self-pay | Admitting: Podiatry

## 2023-05-16 VITALS — Ht 65.0 in | Wt 140.0 lb

## 2023-05-16 DIAGNOSIS — G609 Hereditary and idiopathic neuropathy, unspecified: Secondary | ICD-10-CM | POA: Diagnosis not present

## 2023-05-18 NOTE — Progress Notes (Signed)
   Chief Complaint  Patient presents with   Peripheral Neuropathy    Patient is here for bilateral neuropathy, lots of tingling and ultra sensitivity     HPI: 83 y.o. male presenting today as a new patient for evaluation of bilateral neuropathy.  Patient experiences tingling and sensitivity to the feet bilateral.  This been ongoing for several years.  Past Medical History:  Diagnosis Date   Carotid artery occlusion    Complication of anesthesia    pt states following last surgery he was awake for over 24 hours following surgery   Hypertension    Stroke [facility])     Past Surgical History:  Procedure Laterality Date   ENDARTERECTOMY Left 08/22/2019   Procedure: LEFT CAROTID ENDARTERECTOMY WITH BOVIE PATCH ANGIOPLASTY;  Surgeon: Margherita Shell, MD;  Location: [facility] OR;  Service: Vascular;  Laterality: Left;   LAPAROSCOPIC NEPHRECTOMY Right    NECK SURGERY     PROSTATE SURGERY      Allergies  Allergen Reactions   Hydrochlorothiazide     Other reaction(s): Other (See Comments) Leg pain      Physical Exam: General: The patient is alert and oriented x3 in no acute distress.  Dermatology: Skin is warm, dry and supple bilateral lower extremities.   Vascular: Palpable pedal pulses bilaterally. Capillary refill within normal limits.  No appreciable edema.  No erythema.  Neurological: Light touch and protective threshold diminished.  Paresthesia noted with light touch diffusely throughout the bilateral feet  Musculoskeletal Exam: No pedal deformities noted.  Muscle strength 5/5 all compartments.  ROM WNL  Assessment/Plan of Care: 1.  Lumbar radiculopathy bilateral 2.  Peripheral polyneuropathy for several years  -Patient evaluated -After evaluating the patient I do believe that the majority of his symptoms are coming from his lumbar pathology.  This has been an ongoing issue for several years.  Explained that this is likely a permanent condition -He has tried different oral  medications for the neuropathy which have not alleviated his symptoms and have side effects. -Recommend good supportive tennis shoes to provide stability to the feet -Return to clinic as needed       Dot Gazella, DPM [facility]  Dr. Dot Gazella, DPM    [address] [place], [place] [address]                Office [phone]  Fax [phone]

## 2023-06-19 ENCOUNTER — Other Ambulatory Visit: Payer: Self-pay | Admitting: Cardiovascular Disease

## 2023-07-11 ENCOUNTER — Ambulatory Visit (INDEPENDENT_AMBULATORY_CARE_PROVIDER_SITE_OTHER): Admitting: Family Medicine

## 2023-07-11 ENCOUNTER — Encounter (HOSPITAL_BASED_OUTPATIENT_CLINIC_OR_DEPARTMENT_OTHER): Payer: Self-pay | Admitting: Family Medicine

## 2023-07-11 VITALS — BP 141/58 | HR 67 | Ht 65.0 in | Wt 141.0 lb

## 2023-07-11 DIAGNOSIS — R7303 Prediabetes: Secondary | ICD-10-CM | POA: Diagnosis not present

## 2023-07-11 DIAGNOSIS — R202 Paresthesia of skin: Secondary | ICD-10-CM

## 2023-07-11 DIAGNOSIS — N1831 Chronic kidney disease, stage 3a: Secondary | ICD-10-CM

## 2023-07-11 DIAGNOSIS — R269 Unspecified abnormalities of gait and mobility: Secondary | ICD-10-CM | POA: Diagnosis not present

## 2023-07-11 DIAGNOSIS — R200 Anesthesia of skin: Secondary | ICD-10-CM

## 2023-07-11 DIAGNOSIS — I10 Essential (primary) hypertension: Secondary | ICD-10-CM

## 2023-07-11 LAB — BASIC METABOLIC PANEL WITH GFR
BUN/Creatinine Ratio: 20 (ref 10–24)
BUN: 27 mg/dL (ref 8–27)
CO2: 21 mmol/L (ref 20–29)
Calcium: 9.8 mg/dL (ref 8.6–10.2)
Chloride: 103 mmol/L (ref 96–106)
Creatinine, Ser: 1.34 mg/dL — ABNORMAL HIGH (ref 0.76–1.27)
Glucose: 83 mg/dL (ref 70–99)
Potassium: 4.8 mmol/L (ref 3.5–5.2)
Sodium: 141 mmol/L (ref 134–144)
eGFR: 53 mL/min/1.73 — ABNORMAL LOW (ref >59)

## 2023-07-11 LAB — HEMOGLOBIN A1C
Est. average glucose Bld gHb Est-mCnc: 120 mg/dL
Hgb A1c MFr Bld: 5.8 % — ABNORMAL HIGH (ref 4.8–5.6)

## 2023-07-11 MED ORDER — HYDRALAZINE HCL 10 MG PO TABS: 10.0000 mg | ORAL_TABLET | Freq: Two times a day (BID) | ORAL | 3 refills | Status: DC

## 2023-07-11 NOTE — Progress Notes (Signed)
 Subjective:   Jonathan Savage 21-Oct-1940 07/11/2023  Chief Complaint  Patient presents with   Medical Management of Chronic Issues    16-month follow up; states when he eats he will become tired. Also still having problems with legs and feet.    HPI: Jonathan Savage presents today for re-assessment and management of chronic medical conditions.  BLOOD PRESSURE: Current regimen includes amlodipine  5mg  BID, hydralazine  25mg  BID, and irbesartan  150mg  daily. Pt decreased irbesartan  to 150mg  since last visit 05/09/23, states dizziness has improved however still present when changing positions. Pt states he feels like he's walking on sponges. He denies improvement with dizziness when he eats or drinks. He consumes at least #4 8oz glasses daily.   NEUROPATHY: Pt states his feet and legs are still bothering him, states he saw podiatry and a cream was prescribed. He only used the cream twice before deciding to stop it. He did purchase a half size bigger shoe and states this has helped, however taking his shoes off and walking barefoot also alleviates sx. He denies pain or complaints related to BLE during today's visit.    The following portions of the patient's history were reviewed and updated as appropriate: past medical history, past surgical history, family history, social history, allergies, medications, and problem list.   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) 06/08/2019   Essential hypertension 06/05/2019   Hyperlipidemia 06/05/2019    Carotid bruit 06/05/2019   Past Medical History:  Diagnosis Date   Carotid artery occlusion    Complication of anesthesia    pt states following last surgery he was awake for over 24 hours following surgery   Hypertension    Stroke [facility])    Past Surgical History:  Procedure Laterality Date   ENDARTERECTOMY Left 08/22/2019   Procedure: LEFT CAROTID ENDARTERECTOMY WITH BOVIE PATCH ANGIOPLASTY;  Surgeon: Serene Gaile ORN, MD;  Location: [facility] OR;  Service: Vascular;  Laterality: Left;   LAPAROSCOPIC NEPHRECTOMY Right    NECK SURGERY     PROSTATE SURGERY     Family History  Problem Relation Age of Onset   Dementia Mother    Heart failure Father        Deteriration of the heart   Stroke Brother    Outpatient Medications Prior to Visit  Medication Sig Dispense Refill   amLODipine  (NORVASC ) 5 MG tablet TAKE 1 TABLET (5 MG TOTAL) BY MOUTH IN THE MORNING AND AT BEDTIME 180 tablet 3   aspirin  EC 81 MG tablet Take 81 mg by mouth daily.     Omega 3-6-9 Fatty Acids (OMEGA 3-6-9 COMPLEX PO) Take by mouth.     rosuvastatin  (CRESTOR ) 10 MG tablet TAKE 1 TABLET BY MOUTH EVERY DAY 90 tablet 2   vitamin B-12 (CYANOCOBALAMIN) 1000 MCG tablet Take 1,000 mcg by mouth daily.     vitamin C (ASCORBIC ACID) 500 MG tablet Take 500 mg by mouth daily.     hydrALAZINE  (APRESOLINE ) 25 MG tablet TAKE 1 TABLET BY MOUTH IN THE MORNING AND IN THE EVENING 180 tablet  2   irbesartan  (AVAPRO ) 150 MG tablet TAKE 1 & 1/2 TABLETS BY MOUTH EVERY DAY (Patient not taking: Reported on 07/11/2023) 135 tablet 2   augmented betamethasone dipropionate (DIPROLENE-AF) 0.05 % cream SMARTSIG:1 Sparingly Topical Twice Daily PRN     No facility-administered medications prior to visit.   Allergies  Allergen Reactions   Hydrochlorothiazide     Other reaction(s): Other (See Comments) Leg pain     ROS: A complete ROS was performed with pertinent positives/negatives noted in the HPI. The remainder of the ROS are negative.     Objective:   Today's Vitals   07/11/23 0937  BP: (!) 141/58  Pulse: 67  SpO2: 98%  Weight: 64 kg  Height: 5' 5 (1.651 m)    GENERAL: Well-appearing, in NAD. Well nourished.  SKIN: Pink, warm and dry. No rash, lesion, ulceration, or ecchymoses.  Head: Normocephalic. NECK: Trachea midline. Full ROM w/o pain or tenderness. No lymphadenopathy.  EYES: Conjunctiva clear without exudates. EOMI, PERRL, no drainage present.   RESPIRATORY: Chest wall symmetrical. Respirations even and non-labored. Breath sounds clear to auscultation bilaterally.  CARDIAC: S1, S2 present, regular rate and rhythm without murmur or gallops. Peripheral pulses 2+ bilaterally.  MSK: Muscle tone and strength appropriate for age. Joints w/o tenderness, redness, or swelling.  EXTREMITIES: Without clubbing, cyanosis, or edema.  NEUROLOGIC: No motor or sensory deficits. Steady, even gait. C2-C12 intact.  PSYCH/MENTAL STATUS: Alert, oriented x 3. Cooperative, appropriate mood and affect.   Health Maintenance Due  Topic Date Due   Pneumococcal Vaccine: 50+ Years (1 of 2 - PCV) Never done   Zoster Vaccines- Shingrix (1 of 2) Never done   COVID-19 Vaccine (3 - 2024-25 season) 09/05/2022    No results found for any visits on 07/11/23.  The ASCVD Risk score (Arnett DK, et al., 2019) failed to calculate for the following reasons:   The 2019 ASCVD risk score is only valid for ages 76 to 27   Risk score cannot be calculated because patient has a medical history suggesting prior/existing ASCVD     Assessment & Plan:  1. Essential hypertension (Primary) Discussed decreasing dose of hydralazine  to 10mg  in the morning, and 10mg  at night.  Pt agreed to this, and understands to return to office for blood pressure check only in approximately 4 weeks.  - hydrALAZINE  (APRESOLINE ) 10 MG tablet; Take 1 tablet (10 mg total) by mouth in the morning and at bedtime.  Dispense: 60 tablet; Refill: 3  2. Numbness and tingling of both  lower extremities Improved per pt.   3. Impaired gait  Recommended physical therapy, referral placed.   4. Prediabetes - Hemoglobin A1c  5. CKD stage 3a, GFR 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) - Basic metabolic panel with GFR  Essential hypertension -     hydrALAZINE  HCl; Take 1 tablet (10 mg total) by mouth in the morning and at bedtime.  Dispense: 60 tablet; Refill: 3  Numbness and tingling of both lower extremities  Impaired gait  Prediabetes -     Hemoglobin A1c  CKD stage 3a, GFR 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) -     Basic metabolic panel with GFR    Meds ordered this encounter  Medications   hydrALAZINE  (APRESOLINE ) 10 MG tablet    Sig: Take 1 tablet (10 mg total) by mouth in the morning and at bedtime.    Dispense:  60 tablet    Refill:  3    Supervising Provider:   DE PERU, RAYMOND J [[id number]]  Lab Orders         Basic metabolic panel with GFR         Hemoglobin A1c     No images are attached to the encounter or orders placed in the encounter.  Return in about 3 months (around 10/11/2023) for 4 weeks for BP check; 18mo for chronic conditions.    Patient to reach out to office if new, worrisome, or unresolved symptoms arise or if no improvement in patient's condition. Patient verbalized understanding and is agreeable to treatment plan. All questions answered to patient's satisfaction.   Treatment plan and recommendation(s) reviewed by supervising preceptor, Thersia CLEMENTEEN Stark, FNP-C, prior to clinic discharge.   Rosina Ada, BSN, RN  DNP Student

## 2023-07-11 NOTE — Progress Notes (Signed)
 Subjective:   Jonathan [facility] 25-Mar-1940 07/11/2023  Chief Complaint  Patient presents with   Medical Management of Chronic Issues    67-month follow up; states when he eats he will become tired. Also still having problems with legs and feet.    HPI: Jonathan Savage presents today for re-assessment and management of chronic medical conditions.  BLOOD PRESSURE: Current regimen includes amlodipine  5mg  BID, hydralazine  25mg  BID, and irbesartan  150mg  daily. Pt decreased irbesartan  to 150mg  since last visit 05/09/23, states dizziness has improved however still present when changing positions. Pt states he feels like he's walking on sponges. He denies improvement with dizziness when he eats or drinks. He consumes at least #4 8oz glasses daily.   NEUROPATHY: Pt states his feet and legs are still bothering him, states he saw podiatry and a cream was prescribed. He only used the cream twice before deciding to stop it. He did purchase a half size bigger shoe and states this has helped, however taking his shoes off and walking barefoot also alleviates sx. He denies pain or complaints related to BLE during today's visit.    The following portions of the patient's history were reviewed and updated as appropriate: past medical history, past surgical history, family history, social history, allergies, medications, and problem list.   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)    Past Surgical History:  Procedure Laterality Date   ENDARTERECTOMY Left 08/22/2019   Procedure: LEFT CAROTID ENDARTERECTOMY WITH BOVIE PATCH ANGIOPLASTY;  Surgeon: Serene Gaile ORN, MD;  Location: [facility] OR;  Service: Vascular;  Laterality: Left;   LAPAROSCOPIC NEPHRECTOMY Right    NECK SURGERY     PROSTATE SURGERY     Family History  Problem Relation Age of Onset   Dementia Mother    Heart failure Father        Deteriration of the heart   Stroke Brother    Outpatient Medications Prior to Visit  Medication Sig Dispense Refill   amLODipine  (NORVASC ) 5 MG tablet TAKE 1 TABLET (5 MG TOTAL) BY MOUTH IN THE MORNING AND AT BEDTIME 180 tablet 3   aspirin  EC 81 MG tablet Take 81 mg by mouth daily.     Omega 3-6-9 Fatty Acids (OMEGA 3-6-9 COMPLEX PO) Take by mouth.     rosuvastatin  (CRESTOR ) 10 MG tablet TAKE 1 TABLET BY MOUTH EVERY DAY 90 tablet 2   vitamin B-12 (CYANOCOBALAMIN) 1000 MCG tablet Take 1,000 mcg by mouth daily.     vitamin C (ASCORBIC ACID) 500 MG tablet Take 500 mg by mouth daily.     hydrALAZINE  (APRESOLINE ) 25 MG tablet TAKE 1 TABLET BY MOUTH IN THE MORNING AND IN THE EVENING 180 tablet  2   irbesartan  (AVAPRO ) 150 MG tablet TAKE 1 & 1/2 TABLETS BY MOUTH EVERY DAY (Patient not taking: Reported on 07/11/2023) 135 tablet 2   augmented betamethasone dipropionate (DIPROLENE-AF) 0.05 % cream SMARTSIG:1 Sparingly Topical Twice Daily PRN     No facility-administered medications prior to visit.   Allergies  Allergen Reactions   Hydrochlorothiazide     Other reaction(s): Other (See Comments) Leg pain     ROS: A complete ROS was performed with pertinent positives/negatives noted in the HPI. The remainder of the ROS are negative.     Objective:   Today's Vitals   07/11/23 0937  BP: (!) 141/58  Pulse: 67  SpO2: 98%  Weight: 141 lb (64 kg)  Height: 5' 5 (1.651 m)    GENERAL: Well-appearing, in NAD. Well nourished.  SKIN: Pink, warm and dry. No rash, lesion, ulceration, or ecchymoses.  Head: Normocephalic. NECK: Trachea midline. Full ROM w/o pain or tenderness. No lymphadenopathy.  EYES: Conjunctiva clear without exudates. EOMI, PERRL, no drainage present.   RESPIRATORY: Chest wall symmetrical. Respirations even and non-labored. Breath sounds clear to auscultation bilaterally.  CARDIAC: S1, S2 present, regular rate and rhythm without murmur or gallops. Peripheral pulses 2+ bilaterally.  MSK: Muscle tone and strength appropriate for age. Joints w/o tenderness, redness, or swelling.  EXTREMITIES: Without clubbing, cyanosis, or edema.  NEUROLOGIC: No motor or sensory deficits. Steady, even gait without ataxia down the hall and back. C2-C12 intact.  PSYCH/MENTAL STATUS: Alert, oriented x 3. Cooperative, appropriate mood and affect.   Health Maintenance Due  Topic Date Due   Pneumococcal Vaccine: 50+ Years (1 of 2 - PCV) Never done   Zoster Vaccines- Shingrix (1 of 2) Never done   COVID-19 Vaccine (3 - 2024-25 season) 09/05/2022    No results found for any visits on 07/11/23.  The ASCVD Risk score (Arnett DK, et al., 2019) failed to calculate for the following reasons:   The 2019 ASCVD risk score is only valid for ages 86 to 57   Risk score cannot be calculated because patient has a medical history suggesting prior/existing ASCVD     Assessment & Plan:  1. Essential hypertension (Primary) Discussed decreasing dose of hydralazine  to 10mg  in the morning, and 10mg  at night to improve dizziness likely related to hypotension.  Pt agreed to this, and understands to return to office for blood pressure check only in approximately 4 weeks.  - hydrALAZINE  (APRESOLINE ) 10 MG tablet; Take 1 tablet (10 mg total) by mouth  in the morning and at bedtime.  Dispense: 60 tablet; Refill: 3  2. Numbness and tingling of both lower extremities Improved per pt. Continue with Podiatry recommendations.   3. Impaired gait  Recommended physical therapy, patient declined at this time. Declined supportive measure such as cane/walker, etc.   4. Prediabetes - Hemoglobin A1c  5. CKD stage 3a, GFR 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) - Basic metabolic panel with GFR  Meds ordered this encounter  Medications   hydrALAZINE  (APRESOLINE ) 10 MG tablet    Sig: Take 1 tablet (10 mg total) by mouth in the morning and at bedtime.    Dispense:  60 tablet    Refill:  3    Supervising Provider:   DE PERU, RAYMOND J [[id number]]   Lab Orders         Basic metabolic panel with GFR         Hemoglobin A1c     No images are attached to the encounter or orders placed  in the encounter.  Return in about 3 months (around 10/11/2023) for 4 weeks for BP check; 36mo for chronic conditions.    Patient to reach out to office if new, worrisome, or unresolved symptoms arise or if no improvement in patient's condition. Patient verbalized understanding and is agreeable to treatment plan. All questions answered to patient's satisfaction.   Thersia Stark, FNP

## 2023-07-12 ENCOUNTER — Ambulatory Visit (HOSPITAL_BASED_OUTPATIENT_CLINIC_OR_DEPARTMENT_OTHER): Payer: Self-pay | Admitting: Family Medicine

## 2023-07-12 NOTE — Progress Notes (Signed)
 Hi Jonathan Savage,  Your kidney function and A1C are stable. No changes needed to medication currently.

## 2023-07-14 ENCOUNTER — Telehealth (HOSPITAL_BASED_OUTPATIENT_CLINIC_OR_DEPARTMENT_OTHER): Payer: Self-pay | Admitting: *Deleted

## 2023-07-14 NOTE — Telephone Encounter (Signed)
 noted

## 2023-07-14 NOTE — Telephone Encounter (Signed)
 Copied from CRM [id number]. Topic: Clinical - Lab/Test Results >> Jul 14, 2023  1:19 PM Ivette P wrote: Reason for CRM: Pt called in due to a missed call and wanted to know lab results   Read as follows :    Hi Jonathan Savage,  Your kidney function and A1C are stable. No changes needed to medication currently.    Pt understood no further questions.

## 2023-08-08 ENCOUNTER — Ambulatory Visit (INDEPENDENT_AMBULATORY_CARE_PROVIDER_SITE_OTHER): Admitting: *Deleted

## 2023-08-08 VITALS — BP 139/55 | HR 62

## 2023-08-08 DIAGNOSIS — I10 Essential (primary) hypertension: Secondary | ICD-10-CM | POA: Diagnosis not present

## 2023-08-08 NOTE — Progress Notes (Signed)
 Patient came in for nurse visit to have BP checked. BP checked twice and both readings have been documented.

## 2023-08-26 ENCOUNTER — Ambulatory Visit: Payer: Self-pay

## 2023-08-26 NOTE — Telephone Encounter (Signed)
 FYI Only or Action Required?: FYI only for provider.  Patient was last seen in primary care on 07/11/2023 by Knute Thersia Bitters, FNP.  Called Nurse Triage reporting Fatigue.  Symptoms began a week ago.  Interventions attempted: Nothing.  Symptoms are: gradually worsening.  Triage Disposition: See Physician Within 24 Hours  Patient/caregiver understands and will follow disposition?: Yes- Patient agreeable to visit UC to eval symptoms.   Copied from CRM [id number]. Topic: Clinical - Red Word Triage >> Aug 26, 2023  2:14 PM Marissa P wrote: Red Word that prompted transfer to Nurse Triage: Patient is feeling very weak, don't walk good and cannot sleep. Little dizziness, pain in feet, shoulders and back etc. Both arms are starting to get tingly when going to bed wake up feeling that way. Been going on for about a week or so some days better than others. Usually goes to gym twice a week but haven't been in a while. Reason for Disposition  [1] MODERATE weakness (e.g., interferes with work, school, normal activities) AND [2] persists > 3 days  Answer Assessment - Initial Assessment Questions 1. DESCRIPTION: Describe how you are feeling.     Feeling tired lately and gets tired doing usually activity  2. SEVERITY: How bad is it?  Can you stand and walk?     Can walk but tires easily  3. ONSET: When did these symptoms begin? (e.g., hours, days, weeks, months)     At a least a weak  4. CAUSE: What do you think is causing the weakness or fatigue? (e.g., not drinking enough fluids, medical problem, trouble sleeping)     Unsure of cause,   5. NEW MEDICINES:  Have you started on any new medicines recently? (e.g., opioid pain medicines, benzodiazepines, muscle relaxants, antidepressants, antihistamines, neuroleptics, beta blockers)     No new medication, but reduced hydralazine  back in July  6. OTHER SYMPTOMS: Do you have any other symptoms? (e.g., chest pain, fever, cough, SOB,  vomiting, diarrhea, bleeding, other areas of pain)     Pain in back, shoulders, and neck; trouble sleeping; bilateral legs tingling, and feet burning sensation; frequent urination  Protocols used: Weakness (Generalized) and Fatigue-A-AH

## 2023-08-27 ENCOUNTER — Ambulatory Visit (HOSPITAL_COMMUNITY): Payer: Self-pay

## 2023-09-09 ENCOUNTER — Telehealth (HOSPITAL_BASED_OUTPATIENT_CLINIC_OR_DEPARTMENT_OTHER): Payer: Self-pay | Admitting: Family Medicine

## 2023-09-09 ENCOUNTER — Ambulatory Visit: Payer: Self-pay | Admitting: *Deleted

## 2023-09-09 NOTE — Telephone Encounter (Signed)
 Called and spoke with pt letting him know recommendations per Thersia and he verbalized understanding.nothing further needed.

## 2023-09-09 NOTE — Telephone Encounter (Signed)
 Copied from CRM [id number]. Topic: Clinical - Red Word Triage >> Sep 09, 2023  8:15 AM Myrick T wrote: Red Word that prompted transfer to Nurse Triage: patient said last night he had body aches, constipation with tarred stool, he been really tired and not wanting to get out of bed. He said his blood pressure was 167/80 pulse 60 and he just feels really bad this morning, so bad he almost called EMS last night >> Sep 09, 2023  8:55 AM Myrick T wrote: Long hold attempted to advise patient of the long hold and accidentally disconnected the call. Tried to call back but no answer. Reason for Disposition  Unable to have a bowel movement (BM) without laxative or enema  Answer Assessment - Initial Assessment Questions 1. STOOL PATTERN OR FREQUENCY: How often do you have a bowel movement (BM)?  (Normal range: 3 times a day to every 3 days)  When was your last BM?       I'm taking Miralax it's not working.   I had a small BM.   I'm eating more than what's coming out.    I've been having trouble since being on this medicine for BP.   I'm on the same BP medicine.   They decreased the dose.  I've always had problems with constipation.   Lately it's so bad I feel bad.    Last night I ache all over and can't hardly move around.   I almost called EMS.   Both of my feet were numb and my hands too.   My left arm gives me trouble but I think I had gas or something.   I took some gas capsules for gas.  That finally helped.   Something going on with me.    The last couple of weeks I am tired.    I saw Thersia Stark about my legs and feet.   My feet are getting blood.    After I eat I get so tired I have to lay down.   It has something to do with eating. 2. STRAINING: Do you have to strain to have a BM?      I run into the bathroom feeling like I really need to go and only pass gas and a little BM after taking the Miralax.   3. ONSET: When did the constipation begin?     Ever since they increased my dosage on my BP.     I was on 20 mg of my BP medicine.   (He is giving contradicting information). 4. RECTAL PAIN: Does your rectum hurt when the stool comes out? If Yes, ask: Do you have hemorrhoids? How bad is the pain?  (Scale 1-10; or mild, moderate, severe)     When I sit down it feels like I have to go.   I have pressure in my rectum.   No bleeding. 5. BM COMPOSITION: Are the stools hard?      Some of the BMs are dark, not black but dark.    Some of them are normal colored 6. BLOOD ON STOOLS: Has there been any blood on the toilet tissue or on the surface of the BM? If Yes, ask: When was the last time?     No blood 7. CHRONIC CONSTIPATION: Is this a new problem for you?  If No, ask: How long have you had this problem? (days, weeks, months)      Yes I've had trouble with constipation for a long time.  8. CHANGES IN DIET OR HYDRATION: Have there been any recent changes in your diet? How much fluids are you drinking on a daily basis?  How much have you had to drink today?     No 9. MEDICINES: Have you been taking any new medicines? Are you taking any narcotic pain medicines? (e.g., Dilaudid , morphine, Percocet, Vicodin)     I'm on BP medication.    Denies new medications.    After I eat I feel terrible.   I have to lay down.   Not on pain ;medications 10. LAXATIVES: Have you been using any stool softeners, laxatives, or enemas?  If Yes, ask What are you using, how often, and when was the last time?       Miralax  11. ACTIVITY:  How much walking do you do every day?  Has your activity level decreased in the past week?        I get so tired after I eat I have to lay down 12. CAUSE: What do you think is causing the constipation?        My BP medication.    (He is giving different answers to this questions) 13. MEDICAL HISTORY: Do you have a history of hemorrhoids, rectal fissures, rectal surgery, or rectal abscess?         No 14. OTHER SYMPTOMS: Do you have any other symptoms?  (e.g., abdomen pain, bloating, fever, vomiting)       See above 15. PREGNANCY: Is there any chance you are pregnant? When was your last menstrual period?       N/A  Protocols used: Constipation-A-AH Pt was difficult to triage as he kept contradicting himself.   Had multiple complaints.   There are no appts available with Thersia Stark until Oct. 8.   I offered him an appt for 09/12/2023 with Dr. Penne however he did not want to see him.   I've seen him twice and he don't seem to care.  I don't want to see him.   I don't understand why you can't talk to my doctor and tell her I need to see her and get me in.   I explained to him that her schedule was full until Oct. 8.   I offered him an appt with another [facility] practice that had an opening on Monday 09/12/2023.   He refused that.   That doctor won't know me and what's going on.    I want to see my doctor.     I let him know I would send a message to Thersia Stark, FNP and someone would call him back as there are no sooner appts with Thersia.   He was agreeable to this plan.   I instructed him to go to the urgent care or ED if his symptoms got worse.  He still did not understand why I can't just come see my doctor.   Message sent to practice for Thersia Stark, FNP.    I did not have any other options to offer him.   FYI Only or Action Required?: Action required by provider: request for appointment.  Patient was last seen in primary care on 07/11/2023 by Stark Thersia Bitters, FNP.  Called Nurse Triage reporting Constipation.  Symptoms began several weeks ago.  Interventions attempted: OTC medications: Miralax.  Symptoms are: gradually worsening.  Triage Disposition: See PCP When Office is Open (Within 3 Days)  Patient/caregiver understands and will follow disposition?: Yes  FYI Only or Action Required?: Action required by provider: request for appointment.  Patient was last seen in primary care on 07/11/2023 by Knute Thersia Bitters, FNP.  Called Nurse Triage reporting Constipation. Taking Miralax  Had small BM 9/4.   Symptoms began several weeks ago. Been having issues with constipation for a long time.   Interventions attempted: OTC medications: Miralax and Rest, hydration, or home remedies.  Symptoms are: gradually worsening.  Triage Disposition: See PCP When Office is Open (Within 3 Days)  Patient/caregiver understands and will follow disposition?: Yes

## 2023-09-09 NOTE — Telephone Encounter (Signed)
 Copied from CRM #[id number]. Topic: Appointments - Scheduling Inquiry for Clinic >> Sep 09, 2023 12:00 PM Emylou G wrote: Reason for CRM: Please call patient.. said he missed our call at 1155am?  Pls call if he can be seen today - is what he thought it was?

## 2023-09-09 NOTE — Telephone Encounter (Signed)
 Routing to Bethel for her to review to see what she recommends.

## 2023-09-16 ENCOUNTER — Other Ambulatory Visit: Payer: Self-pay | Admitting: Cardiovascular Disease

## 2023-09-19 ENCOUNTER — Other Ambulatory Visit: Payer: Self-pay | Admitting: Cardiovascular Disease

## 2023-09-20 ENCOUNTER — Telehealth: Payer: Self-pay | Admitting: Cardiovascular Disease

## 2023-09-20 NOTE — Telephone Encounter (Signed)
 Called and spoke to patient and daughter. Per Patient he takes irbesartan  150 mg daily, one tablet a day. Called and spoke to pharmacy and clarification is needed for medications dosage. Per pharmacy patient insurance will not approve  irbesartan  225 mg  dose. Per pharmacy prior authorization may be needed for dose. Per pharmacy may need to send in prescription  if provider would like patient to start 225 mg irbesartan  to send a script for 150 mg tablet and 75 mg tablet of Irbesartan . Made patient and pharmacy aware provider will be notified for advice and recommendations.

## 2023-09-20 NOTE — Telephone Encounter (Signed)
 Pt c/o medication issue:  1. Name of Medication:   irbesartan  (AVAPRO ) 150 MG tablet    2. How are you currently taking this medication (dosage and times per day)?   TAKE 1 & 1/2 TABLETS BY MOUTH EVERY DAY    3. Are you having a reaction (difficulty breathing--STAT)? No  4. What is your medication issue? According to pharmacy, pt is only taking 1 tablet daily per Dr. Court (rather than 1 1/2 daily) and needs medication description to be changed accordingly and sent back in to pharmacy to be refilled with updated description. Please advise.

## 2023-09-21 ENCOUNTER — Other Ambulatory Visit: Payer: Self-pay | Admitting: *Deleted

## 2023-09-21 MED ORDER — IRBESARTAN 150 MG PO TABS: 150.0000 mg | ORAL_TABLET | Freq: Every day | ORAL | 3 refills | Status: AC

## 2023-09-21 MED ORDER — IRBESARTAN 75 MG PO TABS: 75.0000 mg | ORAL_TABLET | Freq: Every day | ORAL | 3 refills | Status: AC

## 2023-09-21 NOTE — Progress Notes (Signed)
 Made patient aware that he should be taking Irbesartan  225 mg per Dr. Court. Prescription sent to pharmacy for 90 pills 150 mg with 3 refills and  Irbesartan  75 mg sent to pharmacy with 3 refills.Spoke to pharmacy and per pharmacy initial prescription needed to be rewritten Made patient  and daughter aware to take 150 mg tablet plus 75 mg to total 225 mg. Patient  and daughter, Veva verbalized an understanding. Appt made for patient 09/27/23 @10 :15 am. Made patient  and daughter, Veva aware that prescription was ready. Pt verbalized an understanding

## 2023-09-23 ENCOUNTER — Ambulatory Visit

## 2023-09-23 ENCOUNTER — Encounter (HOSPITAL_COMMUNITY)

## 2023-09-27 ENCOUNTER — Ambulatory Visit: Attending: Cardiovascular Disease | Admitting: Cardiovascular Disease

## 2023-09-27 ENCOUNTER — Encounter: Payer: Self-pay | Admitting: Cardiovascular Disease

## 2023-09-27 VITALS — BP 140/50 | HR 61 | Ht 65.0 in | Wt 142.0 lb

## 2023-09-27 DIAGNOSIS — E782 Mixed hyperlipidemia: Secondary | ICD-10-CM | POA: Insufficient documentation

## 2023-09-27 DIAGNOSIS — I6522 Occlusion and stenosis of left carotid artery: Secondary | ICD-10-CM | POA: Insufficient documentation

## 2023-09-27 DIAGNOSIS — I471 Supraventricular tachycardia, unspecified: Secondary | ICD-10-CM | POA: Insufficient documentation

## 2023-09-27 DIAGNOSIS — I10 Essential (primary) hypertension: Secondary | ICD-10-CM | POA: Insufficient documentation

## 2023-09-27 NOTE — Assessment & Plan Note (Signed)
 History of essential hypertension with blood pressure measured today at 140/50.  He is on amlodipine , hydralazine , and Avapro .

## 2023-09-27 NOTE — Assessment & Plan Note (Signed)
 History of hyperlipidemia on statin therapy with lipid profile performed 03/04/2023 revealing total cholesterol 119, LDL 54 and HDL 52.

## 2023-09-27 NOTE — Assessment & Plan Note (Signed)
 History of carotid artery disease status post asymptomatic left ICA stenosis by duplex ultrasound with endarterectomy performed at my request by Dr. Serene 08/22/2019 with normal annual surveillance carotid Doppler since that time.

## 2023-09-27 NOTE — Patient Instructions (Signed)

## 2023-09-27 NOTE — Progress Notes (Signed)
 09/27/2023 [facility]   [birth year]  [id number]  Primary Physician Caudle, Thersia Bitters, FNP Primary Cardiologist: Dorn JINNY Lesches MD GENI [place], [place], [place]  HPI:  Jonathan Savage is a 83 y.o.   thin appearing widowed Caucasian male father of 3 daughters, grandfather of 5 grandchildren referred by Dr. Verena, his PCP, for a brief episode of syncope.  He is retired Music therapist.  I last saw him in the office 09/15/2022.  He was recently relocated from [name]  to [place] to be closer to his daughter Veva.  I just saw him in the office on 06/05/2019.  His cardiac risk factor profile is notable for treated hypertension and untreated hyperlipidemia.  Father did have bypass surgery.  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  He is never had a heart attack.  Denies chest pain or shortness of breath.  He had a recent episode of brief loss of consciousness while trimming bushes in the yard during the hot summer day.  He attributes this to overheating and dehydration.   I obtained carotid Dopplers on him on 06/07/2019 that showed high-grade left ICA stenosis.  He is also has been complaining claudication.  He denies chest pain or shortness of breath.   I referred him to Dr. Serene who performed uneventful left carotid endarterectomy on 08/22/2019.  He had a nonischemic Myoview  performed for preoperative clearance 06/21/2019.  He did have a vagal episode postoperatively and was seen by Dr. Barbaraann.  Since I saw him in the a year ago he continues to do well.  His major issues are of numbness in his hands and feet which sound like peripheral neuropathy versus radicular as well as some orthostatic dizziness symptoms when he gets up.  Otherwise he denies chest pain or shortness of breath..   Current Meds  Medication Sig   amLODipine  (NORVASC ) 5 MG tablet TAKE 1 TABLET (5 MG TOTAL) BY MOUTH IN THE MORNING AND AT  BEDTIME   aspirin  EC 81 MG tablet Take 81 mg by mouth daily.   hydrALAZINE  (APRESOLINE ) 10 MG tablet Take 1 tablet (10 mg total) by mouth in the morning and at bedtime.   irbesartan  (AVAPRO ) 150 MG tablet Take 1 tablet (150 mg total) by mouth daily. Alone with 75mg  OF Irbesartan    irbesartan  (AVAPRO ) 75 MG tablet Take 1 tablet (75 mg total) by mouth daily.   Omega 3-6-9 Fatty Acids (OMEGA 3-6-9 COMPLEX PO) Take by mouth.   rosuvastatin  (CRESTOR ) 10 MG tablet TAKE 1 TABLET BY MOUTH EVERY DAY   vitamin B-12 (CYANOCOBALAMIN) 1000 MCG tablet Take 1,000 mcg by mouth daily.   vitamin C (ASCORBIC ACID) 500 MG tablet Take 500 mg by mouth daily.     Allergies  Allergen Reactions   Hydrochlorothiazide     Other reaction(s): Other (See Comments) Leg pain     Social History   Socioeconomic History   Marital status: Widowed    Spouse name: Not on file   Number of children: 3   Years of education: Not on file   Highest education level: Not on file  Occupational History   Not on file  Tobacco Use   Smoking status: Former   Smokeless tobacco: Never  Vaping Use   Vaping status: Never Used  Substance and Sexual Activity   Alcohol use: Never   Drug use: Never   Sexual activity: Not Currently  Other Topics  Concern   Not on file  Social History Narrative   Right Handed    Lives in a two story home    Father to 3 girls    Social Drivers of Corporate investment banker Strain: Low Risk  (05/03/2023)   Overall Financial Resource Strain (CARDIA)    Difficulty of Paying Living Expenses: Not hard at all  Food Insecurity: No Food Insecurity (05/03/2023)   Hunger Vital Sign    Worried About Running Out of Food in the Last Year: Never true    Ran Out of Food in the Last Year: Never true  Transportation Needs: No Transportation Needs (05/03/2023)   PRAPARE - Administrator, Civil Service (Medical): No    Lack of Transportation (Non-Medical): No  Physical Activity: Insufficiently  Active (05/03/2023)   Exercise Vital Sign    Days of Exercise per Week: 2 days    Minutes of Exercise per Session: 40 min  Stress: No Stress Concern Present (05/03/2023)   [employer] of Occupational Health - Occupational Stress Questionnaire    Feeling of Stress : Not at all  Social Connections: Socially Integrated (05/03/2023)   Social Connection and Isolation Panel    Frequency of Communication with Friends and Family: More than three times a week    Frequency of Social Gatherings with Friends and Family: More than three times a week    Attends Religious Services: More than 4 times per year    Active Member of [employer] or Organizations: Yes    Attends Engineer, structural: More than 4 times per year    Marital Status: Married  Catering manager Violence: Not At Risk (05/03/2023)   Humiliation, Afraid, Rape, and Kick questionnaire    Fear of Current or Ex-Partner: No    Emotionally Abused: No    Physically Abused: No    Sexually Abused: No     Review of Systems: General: negative for chills, fever, night sweats or weight changes.  Cardiovascular: negative for chest pain, dyspnea on exertion, edema, orthopnea, palpitations, paroxysmal nocturnal dyspnea or shortness of breath Dermatological: negative for rash Respiratory: negative for cough or wheezing Urologic: negative for hematuria Abdominal: negative for nausea, vomiting, diarrhea, bright red blood per rectum, melena, or hematemesis Neurologic: negative for visual changes, syncope, or dizziness All other systems reviewed and are otherwise negative except as noted above.    Blood pressure (!) 140/50, pulse 61, height 5' 5 (1.651 m), weight 142 lb (64.4 kg).  General appearance: alert and no distress Neck: no adenopathy, no carotid bruit, no JVD, supple, symmetrical, trachea midline, and thyroid not enlarged, symmetric, no tenderness/mass/nodules Lungs: clear to auscultation bilaterally Heart: regular rate and rhythm,  S1, S2 normal, no murmur, click, rub or gallop Extremities: extremities normal, atraumatic, no cyanosis or edema Pulses: 2+ and symmetric Skin: Skin color, texture, turgor normal. No rashes or lesions Neurologic: Grossly normal  EKG EKG Interpretation Date/Time:  Tuesday September 27 2023 10:24:02 EDT Ventricular Rate:  61 PR Interval:  166 QRS Duration:  94 QT Interval:  430 QTC Calculation: 432 R Axis:   5  Text Interpretation: Normal sinus rhythm Nonspecific T wave abnormality When compared with ECG of 19-Nov-2022 12:06, Sinus rhythm has replaced Ectopic atrial rhythm Confirmed by Court Carrier [phone]) on 09/27/2023 10:44:33 AM    ASSESSMENT AND PLAN:   Essential hypertension History of essential hypertension with blood pressure measured today at 140/50.  He is on amlodipine , hydralazine , and Avapro .  Hyperlipidemia History of  hyperlipidemia on statin therapy with lipid profile performed 03/04/2023 revealing total cholesterol 119, LDL 54 and HDL 52.  Carotid stenosis, asymptomatic History of carotid artery disease status post asymptomatic left ICA stenosis by duplex ultrasound with endarterectomy performed at my request by Dr. Serene 08/22/2019 with normal annual surveillance carotid Doppler since that time.     Dorn DOROTHA Lesches MD FACP,FACC,FAHA, [place] 09/27/2023 10:54 AM

## 2023-10-05 ENCOUNTER — Other Ambulatory Visit: Payer: Self-pay | Admitting: Surgery

## 2023-10-05 DIAGNOSIS — I6523 Occlusion and stenosis of bilateral carotid arteries: Secondary | ICD-10-CM

## 2023-10-08 ENCOUNTER — Other Ambulatory Visit (HOSPITAL_BASED_OUTPATIENT_CLINIC_OR_DEPARTMENT_OTHER): Payer: Self-pay | Admitting: Family Medicine

## 2023-10-08 DIAGNOSIS — I10 Essential (primary) hypertension: Secondary | ICD-10-CM

## 2023-10-12 ENCOUNTER — Ambulatory Visit (HOSPITAL_BASED_OUTPATIENT_CLINIC_OR_DEPARTMENT_OTHER): Admitting: Family Medicine

## 2023-10-12 ENCOUNTER — Encounter (HOSPITAL_BASED_OUTPATIENT_CLINIC_OR_DEPARTMENT_OTHER): Payer: Self-pay | Admitting: Family Medicine

## 2023-10-12 ENCOUNTER — Ambulatory Visit (INDEPENDENT_AMBULATORY_CARE_PROVIDER_SITE_OTHER): Admitting: Family Medicine

## 2023-10-12 VITALS — BP 142/70 | HR 71 | Ht 65.0 in | Wt 142.0 lb

## 2023-10-12 DIAGNOSIS — F330 Major depressive disorder, recurrent, mild: Secondary | ICD-10-CM | POA: Diagnosis not present

## 2023-10-12 DIAGNOSIS — I10 Essential (primary) hypertension: Secondary | ICD-10-CM | POA: Diagnosis not present

## 2023-10-12 DIAGNOSIS — R202 Paresthesia of skin: Secondary | ICD-10-CM

## 2023-10-12 DIAGNOSIS — R200 Anesthesia of skin: Secondary | ICD-10-CM | POA: Diagnosis not present

## 2023-10-12 DIAGNOSIS — G5793 Unspecified mononeuropathy of bilateral lower limbs: Secondary | ICD-10-CM | POA: Diagnosis not present

## 2023-10-12 DIAGNOSIS — R7303 Prediabetes: Secondary | ICD-10-CM

## 2023-10-12 DIAGNOSIS — K5900 Constipation, unspecified: Secondary | ICD-10-CM

## 2023-10-12 DIAGNOSIS — G8929 Other chronic pain: Secondary | ICD-10-CM

## 2023-10-12 DIAGNOSIS — M25562 Pain in left knee: Secondary | ICD-10-CM

## 2023-10-12 DIAGNOSIS — N1831 Chronic kidney disease, stage 3a: Secondary | ICD-10-CM

## 2023-10-12 LAB — BASIC METABOLIC PANEL WITH GFR
BUN/Creatinine Ratio: 17 (ref 10–24)
BUN: 22 mg/dL (ref 8–27)
CO2: 22 mmol/L (ref 20–29)
Calcium: 10.1 mg/dL (ref 8.6–10.2)
Chloride: 101 mmol/L (ref 96–106)
Creatinine, Ser: 1.33 mg/dL — ABNORMAL HIGH (ref 0.76–1.27)
Glucose: 92 mg/dL (ref 70–99)
Potassium: 4.5 mmol/L (ref 3.5–5.2)
Sodium: 140 mmol/L (ref 134–144)
eGFR: 53 mL/min/1.73 — ABNORMAL LOW (ref >59)

## 2023-10-12 LAB — HEMOGLOBIN A1C
Est. average glucose Bld gHb Est-mCnc: 114 mg/dL
Hgb A1c MFr Bld: 5.6 % (ref 4.8–5.6)

## 2023-10-12 LAB — CBC WITH DIFFERENTIAL/PLATELET
Basophils Absolute: 0.1 x10E3/uL (ref 0.0–0.2)
Basos: 1 %
EOS (ABSOLUTE): 0.4 x10E3/uL (ref 0.0–0.4)
Eos: 5 %
Hematocrit: 44.2 % (ref 37.5–51.0)
Hemoglobin: 14.1 g/dL (ref 13.0–17.7)
Immature Grans (Abs): 0 x10E3/uL (ref 0.0–0.1)
Immature Granulocytes: 0 %
Lymphocytes Absolute: 1.4 x10E3/uL (ref 0.7–3.1)
Lymphs: 18 %
MCH: 29.3 pg (ref 26.6–33.0)
MCHC: 31.9 g/dL (ref 31.5–35.7)
MCV: 92 fL (ref 79–97)
Monocytes Absolute: 1 x10E3/uL — ABNORMAL HIGH (ref 0.1–0.9)
Monocytes: 13 %
Neutrophils Absolute: 5 x10E3/uL (ref 1.4–7.0)
Neutrophils: 63 %
Platelets: 267 x10E3/uL (ref 150–450)
RBC: 4.81 x10E6/uL (ref 4.14–5.80)
RDW: 12.9 % (ref 11.6–15.4)
WBC: 7.9 x10E3/uL (ref 3.4–10.8)

## 2023-10-12 MED ORDER — AMLODIPINE BESYLATE 2.5 MG PO TABS: 2.5000 mg | ORAL_TABLET | Freq: Two times a day (BID) | ORAL | 3 refills | Status: DC

## 2023-10-12 MED ORDER — DULOXETINE HCL 30 MG PO CPEP
30.0000 mg | ORAL_CAPSULE | Freq: Every day | ORAL | 3 refills | Status: DC
Start: 2023-10-12 — End: 2023-10-19

## 2023-10-12 NOTE — Progress Notes (Signed)
 Subjective:   Jonathan Savage, Jonathan Savage 10/12/2023  Chief Complaint  Patient presents with   Medical Management of Chronic Issues    Pt states that his eyes have been bothering him so went to a doctor and was told he had a fungus in his eyes. Was prescribed meds that he wants to have PCP take a look at. States he has been having problems with fatigue and weakness.    HPI: Jonathan Savage presents today for re-assessment and management of chronic medical conditions.  HYPERTENSION: Jonathan Savage presents for the medical management of hypertension.  Patient's current hypertension medication regimen is: amlodipine  5mg  BID, hydralazine  10mg  BID Patient is currently taking prescribed medications for HTN.  Patient is regularly keeping a check on BP at home.  Adhering to low sodium diet: Yes Exercising Regularly: No  Denies headache, dizziness, CP, SHOB, vision changes.   BP Readings from Last 3 Encounters:  10/12/23 (!) 142/70  09/27/23 (!) 140/50  08/08/23 (!) 139/55    EYE COMPLAINT: Pt states he saw Lauraine Fass with Optometry in [place] last week for bilateral eye pain & itching that began many years ago per pt. He was prescribed Xdemvy drops which he picked up at the pharmacy, but hasn't started them yet d/t wanting to speak with PCP first. He states I've been dealing with this problem with my eyes for years and I'm tired of it. Nothing ever helps that people prescribe.    CONSTIPATION:  Pt reports constipation for many months. He reports having a BM every 3-4 days. He started an OTC probiotic that he doesn't think is helping. Pt reports adequate water intake. He has tried Miralax but hasn't noticed a difference. He has not tried increasing fiber/adding fiber supplement to daily regimen. He denies abdominal pain.    NEUROPATHY:  Pt reports chronic numbness & tingling in BLE. He states the numbness & tingling is now causing his lower back to hurt. He  has been to podiatry for neuropathy but states that visit was not beneficial. He states he cannot go to the gym anymore because of the neuropathy. He has not tried physical therapy in the past.   DIZZINESS:  Pt reports ongoing dizziness when changing positions. He states he has to be careful getting up too fast d/t feeling unbalanced. He has completed vestibular physical therapy in the past. He reports low diastolic numbers in the 40-50s recently that has caused him some concern. He denies recent episodes of syncope.     The following portions of the patient's history were reviewed and updated as appropriate: past medical history, past surgical history, family history, social history, allergies, medications, and problem list.   Patient Active Problem List   Diagnosis Date Noted   Neuropathy of both feet 05/09/2023   Numbness and tingling of both lower extremities 11/04/2022   Acute cough 10/25/2022   Lab test positive for detection of COVID-19 virus 10/25/2022   Erectile dysfunction 07/21/2022   Elevated serum creatinine 05/10/2022   CKD stage 3a, GFR 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) 05/10/2022   Prediabetes 10/29/2020   Carotid stenosis, asymptomatic 08/22/2019   Claudication in peripheral vascular disease 06/08/2019   Essential hypertension 06/05/2019   Hyperlipidemia 06/05/2019   Carotid bruit 06/05/2019   Past Medical History:  Diagnosis Date   Carotid artery occlusion    Complication of anesthesia    pt states following last surgery he was awake for over 24 hours following surgery   Hypertension  Stroke [facility])    Past Surgical History:  Procedure Laterality Date   ENDARTERECTOMY Left 08/22/2019   Procedure: LEFT CAROTID ENDARTERECTOMY WITH BOVIE PATCH ANGIOPLASTY;  Surgeon: Serene Gaile ORN, MD;  Location: [facility] OR;  Service: Vascular;  Laterality: Left;   LAPAROSCOPIC NEPHRECTOMY Right    NECK SURGERY     PROSTATE SURGERY     Family History  Problem Relation Age of Onset   Dementia  Mother    Heart failure Father        Deteriration of the heart   Stroke Brother    Outpatient Medications Prior to Visit  Medication Sig Dispense Refill   aspirin  EC 81 MG tablet Take 81 mg by mouth daily.     hydrALAZINE  (APRESOLINE ) 10 MG tablet TAKE 1 TABLET (10 MG TOTAL) BY MOUTH IN THE MORNING AND AT BEDTIME 180 tablet 1   irbesartan  (AVAPRO ) 150 MG tablet Take 1 tablet (150 mg total) by mouth daily. Alone with 75mg  OF Irbesartan  90 tablet 3   irbesartan  (AVAPRO ) 75 MG tablet Take 1 tablet (75 mg total) by mouth daily. 90 tablet 3   Omega 3-6-9 Fatty Acids (OMEGA 3-6-9 COMPLEX PO) Take by mouth.     rosuvastatin  (CRESTOR ) 10 MG tablet TAKE 1 TABLET BY MOUTH EVERY DAY 90 tablet 2   vitamin B-12 (CYANOCOBALAMIN) 1000 MCG tablet Take 1,000 mcg by mouth daily.     vitamin C (ASCORBIC ACID) 500 MG tablet Take 500 mg by mouth daily.     amLODipine  (NORVASC ) 5 MG tablet TAKE 1 TABLET (5 MG TOTAL) BY MOUTH IN THE MORNING AND AT BEDTIME 180 tablet 1   XDEMVY 0.25 % SOLN INSTILL 1 DROP INTO BOTH EYES TWICE DAILY FOR 6 WEEKS (Patient not taking: Reported on 10/12/2023)     No facility-administered medications prior to visit.   Allergies  Allergen Reactions   Hydrochlorothiazide     Other reaction(s): Other (See Comments) Leg pain     ROS: A complete ROS was performed with pertinent positives/negatives noted in the HPI. The remainder of the ROS are negative.    Objective:   Today's Vitals   10/12/23 0855 10/12/23 1004  BP: (!) 154/63 (!) 142/70  Pulse: 71   SpO2: 100%   Weight: 64.4 kg   Height: 5' 5 (1.651 m)     GENERAL: Well-appearing, in NAD. Well nourished.  SKIN: Pink, warm and dry. No rash, lesion, ulceration, or ecchymoses.  Head: Normocephalic. NECK: Trachea midline.  EARS: Tympanic membranes are intact, translucent without bulging and without drainage. Appropriate landmarks visualized.  EYES: Conjunctiva clear without exudates. EOMI, PERRL, no drainage present.   RESPIRATORY: Chest wall symmetrical. Respirations even and non-labored.  CARDIAC: Regular rate and rhythm. No murmurs, gallops or rubs noted. Peripheral pulses 2+ bilaterally.  MSK: Muscle tone and strength appropriate for age. Joints w/o tenderness, redness, or swelling. No laxity left knee.  EXTREMITIES: Without clubbing, cyanosis, or edema.  NEUROLOGIC: No motor or sensory deficits. Steady, even gait while walking down the hall. C2-C12 intact.  PSYCH/MENTAL STATUS: Alert, oriented x 3. Cooperative. Agitated affect.   Health Maintenance Due  Topic Date Due   Pneumococcal Vaccine: 50+ Years (1 of 2 - PCV) Never done   Zoster Vaccines- Shingrix (1 of 2) Never done   COVID-19 Vaccine (3 - 2025-26 season) 09/05/2023    No results found for any visits on 10/12/23.  The ASCVD Risk score (Arnett DK, et al., 2019) failed to calculate for the following reasons:  The 2019 ASCVD risk score is only valid for ages 76 to 77   Risk score cannot be calculated because patient has a medical history suggesting prior/existing ASCVD     Assessment & Plan:  1. Essential hypertension (Primary) Controlled. Discussed adjusting medication dose per pt request to assist with dizzy spells. Pt agreed with this; amlodipine  decreased to 2.5mg  BID.   2. Numbness and tingling of both lower extremities 3. Neuropathy of both feet Offered PT which pt declined. Discussed starting Cymbalta 30mg  nightly. Pt is in agreement with this.   4. Constipation, unspecified constipation type Encouraged pt to continue probiotics, increase water and fiber intake, and to try a stool softener. Bowel regimen information provided with discharge ppw.   5. 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 Will recheck CBC & CMP today.  - CBC with Differential/Platelet - Basic metabolic panel with GFR  6. Prediabetes - Hemoglobin A1c  7. Chronic pain of left knee Discussed the use of physical therapy & referral to ortho for knee  pain. Answered multiple questions regarding how ortho may be able to help with knee pain. Pt states he is open to the idea of seeing an ortho specialist after discussion with PCP. Ambulatory referral to Ortho placed.  - Ambulatory referral to Orthopedic Surgery    Meds ordered this encounter  Medications   amLODipine  (NORVASC ) 2.5 MG tablet    Sig: Take 1 tablet (2.5 mg total) by mouth in the morning and at bedtime.    Dispense:  60 tablet    Refill:  3    Supervising Provider:   DE PERU, RAYMOND J [[id number]]   DULoxetine (CYMBALTA) 30 MG capsule    Sig: Take 1 capsule (30 mg total) by mouth at bedtime.    Dispense:  30 capsule    Refill:  3    Supervising Provider:   DE PERU, RAYMOND J [[id number]]   Lab Orders         CBC with Differential/Platelet         Basic metabolic panel with GFR         Hemoglobin A1c     No images are attached to the encounter or orders placed in the encounter.  Return in about 4 weeks (around 11/09/2023) for Follow up Blood pressure concerns, MDD.    Patient to reach out to office if new, worrisome, or unresolved symptoms arise or if no improvement in patient's condition. Patient verbalized understanding and is agreeable to treatment plan. All questions answered to patient's satisfaction.   Treatment plan and recommendation(s) reviewed by supervising preceptor, Thersia CLEMENTEEN Stark, FNP-C, prior to clinic discharge.   Rosina Ada, BSN, RN  DNP Student

## 2023-10-12 NOTE — Patient Instructions (Addendum)
 MYCHART PHONE NUMBER:  [phone]    BOWEL REGIMEN FOR CONSTIPATION  Begin Colace 100mg  twice daily. Maintenance regimen is using Miralax 1 capful daily and Colace 100mg  twice daily.   2. Increase liquids to at least 6-8 cups of water per day.   3. Increase dietary fiber including raw fruits, vegetables, high fiber grains and breads.   4. Decrease intake of foods that may cause constipation such as dairy (milk, cheeses), processed meats, and white bread.   5. Exercise daily. This can include walking for at least 30 minutes daily.   6. Reserve enough time for bowel movements so you are not rushed. Do no ignore the urge to have a bowel movement.

## 2023-10-12 NOTE — Progress Notes (Signed)
 Subjective:   Jonathan Savage [facility] March 23, 1940 10/12/2023  Chief Complaint  Patient presents with   Medical Management of Chronic Issues    Pt states that his eyes have been bothering him so went to a doctor and was told he had a fungus in his eyes. Was prescribed meds that he wants to have PCP take a look at. States he has been having problems with fatigue and weakness.    HPI: Jonathan Savage presents today for re-assessment and management of chronic medical conditions.  HYPERTENSION: Jonathan Savage presents for the medical management of hypertension.  Patient's current hypertension medication regimen is: amlodipine  5mg  BID, hydralazine  10mg  BID Patient is currently taking prescribed medications for HTN.  Patient is regularly keeping a check on BP at home.  Adhering to low sodium diet: Yes Exercising Regularly: No  Denies headache, dizziness, CP, SHOB, vision changes.   BP Readings from Last 3 Encounters:  10/12/23 (!) 142/70  09/27/23 (!) 140/50  08/08/23 (!) 139/55    EYE COMPLAINT: Pt states he saw Lauraine Fass with Optometry in [place] last week for bilateral eye pain & itching that began many years ago per pt. He was prescribed Xdemvy drops for blepharitis which he picked up at the pharmacy, but hasn't started them yet d/t wanting to speak with PCP first. He states I've been dealing with this problem with my eyes for years and I'm tired of it. Nothing ever helps that people prescribe.    CONSTIPATION:  Pt reports constipation for many months. He reports having a BM every 3-4 days. He started an OTC probiotic that he doesn't think is helping. Pt reports adequate water intake. He has tried Miralax but hasn't noticed a difference. He has not tried increasing fiber/adding fiber supplement to daily regimen. He denies abdominal pain.    NEUROPATHY:  Pt reports chronic numbness & tingling in BLE. He states the numbness & tingling is now causing his lower  back to hurt. He has been to podiatry for neuropathy but states that visit was not beneficial. He states he cannot go to the gym anymore because of the neuropathy. He has not tried physical therapy in the past. He did have improvement with better fitting footwear in the past. Patient reports mild depressive like symptoms in the past several months due to frustration with health and co morbidities.      10/12/2023    9:01 AM 07/11/2023    9:43 AM 05/09/2023    3:28 PM 05/03/2023    2:04 PM 03/04/2023   10:21 AM  Depression screen PHQ 2/9  Decreased Interest 0 0 0 0 0  Down, Depressed, Hopeless 0 0 0 0 0  PHQ - 2 Score 0 0 0 0 0  Altered sleeping 1 1  1  0  Tired, decreased energy 2 1  1  0  Change in appetite 0 0  0 0  Feeling bad or failure about yourself  0 0  0 0  Trouble concentrating 0 0  0 0  Moving slowly or fidgety/restless 0 0  0 0  Suicidal thoughts 0 0  0 0  PHQ-9 Score 3 2  2  0  Difficult doing work/chores Not difficult at all Not difficult at all  Not difficult at all Not difficult at all     DIZZINESS:  Pt reports ongoing dizziness when changing positions. He states he has to be careful getting up too fast d/t feeling unbalanced. He has completed vestibular physical therapy  in the past. He reports low diastolic numbers in the 40-50s recently that has caused him some concern. He denies recent episodes of syncope.  He would like adjustment of blood pressure medication to see if this would help with dizziness. Denies chest pain, blurred vision.   BP Readings from Last 3 Encounters:  10/12/23 (!) 142/70  09/27/23 (!) 140/50  08/08/23 (!) 139/55    The following portions of the patient's history were reviewed and updated as appropriate: past medical history, past surgical history, family history, social history, allergies, medications, and problem list.   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) 05/10/2022   Prediabetes 10/29/2020   Carotid stenosis, asymptomatic 08/22/2019   Claudication in peripheral vascular disease 06/08/2019   Essential hypertension 06/05/2019   Hyperlipidemia 06/05/2019   Carotid bruit 06/05/2019   Past Medical History:  Diagnosis Date   Carotid artery occlusion    Complication of anesthesia    pt states following last surgery he was awake for over 24 hours following surgery   Hypertension    Stroke [facility])    Past Surgical History:  Procedure Laterality Date   ENDARTERECTOMY Left 08/22/2019   Procedure: LEFT CAROTID ENDARTERECTOMY WITH BOVIE PATCH ANGIOPLASTY;  Surgeon: Serene Gaile ORN, MD;  Location: [facility] OR;  Service: Vascular;  Laterality: Left;   LAPAROSCOPIC NEPHRECTOMY Right    NECK SURGERY     PROSTATE SURGERY     Family History  Problem Relation Age of Onset   Dementia Mother    Heart failure Father        Deteriration of the heart   Stroke Brother    Outpatient Medications Prior to Visit  Medication Sig Dispense Refill   aspirin  EC 81 MG tablet Take 81 mg by mouth daily.     hydrALAZINE  (APRESOLINE ) 10 MG tablet TAKE 1 TABLET (10 MG TOTAL) BY MOUTH IN THE MORNING AND AT BEDTIME 180 tablet 1   irbesartan  (AVAPRO ) 150 MG tablet Take 1 tablet (150 mg total) by mouth daily. Alone with 75mg  OF Irbesartan  90 tablet 3   irbesartan  (AVAPRO ) 75 MG tablet Take 1 tablet (75 mg total) by mouth daily. 90 tablet 3   Omega 3-6-9 Fatty Acids (OMEGA 3-6-9 COMPLEX PO) Take by mouth.     rosuvastatin  (CRESTOR ) 10 MG tablet TAKE 1 TABLET BY MOUTH EVERY DAY 90 tablet 2   vitamin B-12 (CYANOCOBALAMIN) 1000 MCG tablet Take 1,000 mcg by mouth daily.     vitamin C (ASCORBIC ACID) 500 MG tablet Take 500 mg by mouth daily.      amLODipine  (NORVASC ) 5 MG tablet TAKE 1 TABLET (5 MG TOTAL) BY MOUTH IN THE MORNING AND AT BEDTIME 180 tablet 1   XDEMVY 0.25 % SOLN INSTILL 1 DROP INTO BOTH EYES TWICE DAILY FOR 6 WEEKS (Patient not taking: Reported on 10/12/2023)     No facility-administered medications prior to visit.   Allergies  Allergen Reactions   Hydrochlorothiazide     Other reaction(s): Other (See Comments) Leg pain     ROS: A complete ROS was performed with pertinent positives/negatives noted in the HPI. The remainder of the ROS are negative.    Objective:  Today's Vitals   10/12/23 0855 10/12/23 1004  BP: (!) 154/63 (!) 142/70  Pulse: 71   SpO2: 100%   Weight: 142 lb (64.4 kg)   Height: 5' 5 (1.651 m)     GENERAL: Well-appearing, in NAD. Well nourished.  SKIN: Pink, warm and dry. No rash, lesion, ulceration, or ecchymoses.  Head: Normocephalic. NECK: Trachea midline.  EARS: Tympanic membranes are intact, translucent without bulging and without drainage. Appropriate landmarks visualized.  EYES: Conjunctiva clear without exudates. EOMI, PERRL, no drainage present.  RESPIRATORY: Chest wall symmetrical. Respirations even and non-labored.  CARDIAC: Regular rate and rhythm. No murmurs, gallops or rubs noted. Peripheral pulses 2+ bilaterally. Carotid arteries without bruit or thrill.  MSK: Muscle tone and strength appropriate for age. Joints w/o tenderness, redness, or swelling. No laxity left knee.  EXTREMITIES: Without clubbing, cyanosis, or edema.  NEUROLOGIC: No motor or sensory deficits. Steady, even gait while walking down the hall. C2-C12 intact.  PSYCH/MENTAL STATUS: Alert, oriented x 3. Cooperative. Agitated affect.   Health Maintenance Due  Topic Date Due   Pneumococcal Vaccine: 50+ Years (1 of 2 - PCV) Never done   Zoster Vaccines- Shingrix (1 of 2) Never done   COVID-19 Vaccine (3 - 2025-26 season) 09/05/2023    No results found for any visits on 10/12/23.  The ASCVD Risk score (Arnett  DK, et al., 2019) failed to calculate for the following reasons:   The 2019 ASCVD risk score is only valid for ages 64 to 59   Risk score cannot be calculated because patient has a medical history suggesting prior/existing ASCVD     Assessment & Plan:  1. Essential hypertension (Primary) Controlled curently, diastolic slightly low. Discussed adjusting medication dose per pt request to assist with dizzy spells. Pt agreed with this; amlodipine  decreased to 2.5mg  BID. Will return in 4 weeks for BP recheck and f/u.   2. Numbness and tingling of both lower extremities 3. Neuropathy of both feet Offered PT and eval with [facility] which pt declined. Discussed starting Cymbalta 30mg  nightly for help with neuropathy without significant CNS effects. Pt is in agreement with this. Will start Cymbalta 30mg  and follow up in 4 weeks or sooner if needed.   4. Constipation, unspecified constipation type Encouraged pt to continue probiotics, increase water and fiber intake, and to try a stool softener. Bowel regimen information provided with discharge ppw.   5. 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 Will recheck CBC & CMP today.  - CBC with Differential/Platelet - Basic metabolic panel with GFR  6. Prediabetes - Hemoglobin A1c  7. Chronic pain of left knee Discussed the use of physical therapy, US  guided injections & referral to ortho for knee pain. Answered multiple questions regarding how ortho may be able to help with knee pain. Pt states he is open to the idea of seeing an ortho specialist after discussion with PCP. Ambulatory referral to Ortho placed.  - Ambulatory referral to Orthopedic Surgery  8. MDD Discussed frustrations with health and healthcare system. Discussed local resources and counseling with support system of family. Safety plan reviewed. Recommend Cymbalta to help with MDD, pain, and neuropathy.   Meds ordered this encounter  Medications   amLODipine  (NORVASC ) 2.5 MG  tablet    Sig: Take 1 tablet (2.5 mg total) by mouth in the morning and at bedtime.    Dispense:  60 tablet    Refill:  3    Supervising Provider:   DE PERU,  QUINTIN PARAS [[id number]]   DULoxetine (CYMBALTA) 30 MG capsule    Sig: Take 1 capsule (30 mg total) by mouth at bedtime.    Dispense:  30 capsule    Refill:  3    Supervising Provider:   DE PERU, RAYMOND J [[id number]]   Lab Orders         CBC with Differential/Platelet         Basic metabolic panel with GFR         Hemoglobin A1c     No images are attached to the encounter or orders placed in the encounter.  Return in about 4 weeks (around 11/09/2023) for Follow up Blood pressure concerns, MDD.   Patient to reach out to office if new, worrisome, or unresolved symptoms arise or if no improvement in patient's condition. Patient verbalized understanding and is agreeable to treatment plan. All questions answered to patient's satisfaction.   Thersia Stark, FNP-C

## 2023-10-13 ENCOUNTER — Ambulatory Visit (HOSPITAL_BASED_OUTPATIENT_CLINIC_OR_DEPARTMENT_OTHER): Payer: Self-pay | Admitting: Family Medicine

## 2023-10-13 NOTE — Progress Notes (Signed)
 Please call patient as he cannot access MyChart. Let him know blood counts and kidney function is stable. His A1C has also improved and is out of prediabetic range down from 5.8 to 5.6. We will continue to monitor every 4-6 months.

## 2023-10-18 ENCOUNTER — Ambulatory Visit: Payer: Self-pay

## 2023-10-18 NOTE — Telephone Encounter (Signed)
 FYI Only or Action Required?: FYI only for provider.  Patient was last seen in primary care on 10/12/2023 by Jonathan Thersia Bitters, FNP.  Called Nurse Triage reporting Dizziness.  Symptoms began today.  Interventions attempted: Nothing.  Symptoms are: gradually improving.  Triage Disposition: Go to ED Now (or PCP Triage)  Patient/caregiver understands and will follow disposition?: No   Copied from CRM #[id number]. Topic: Clinical - Red Word Triage >> Oct 18, 2023  3:14 PM [name] wrote: [facility] that prompted transfer to Nurse Triage: Almost passed out, dizzie spell, blurred vision. Pulse 97 142/85 BP Reason for Disposition  SEVERE dizziness (e.g., unable to stand, requires support to walk, feels like passing out now)  Answer Assessment - Initial Assessment Questions Additional info: 1) Requesting GI referral.  2) Refused ER.    1. DESCRIPTION: Describe your dizziness.     Using bathroom when he developed dizziness, had to sit down otherwise felt he would fall.  2. LIGHTHEADED: Do you feel lightheaded? (e.g., somewhat faint, woozy, weak upon standing)     Felt like he was going to faint. Dizziness is lessening but he still walking off balance.  3. VERTIGO: Do you feel like either you or the room is spinning or tilting? (i.e., vertigo)     Walking off balance.  4. SEVERITY: How bad is it?  Do you feel like you are going to faint? Can you stand and walk?      Walking off balance.  5. ONSET:  When did the dizziness begin?     This morning  6. AGGRAVATING FACTORS: Does anything make it worse? (e.g., standing, change in head position)     Changing position 7. HEART RATE: Can you tell me your heart rate? How many beats in 15 seconds?  (Note: Not all patients can do this.)       97 bp 142/85 8. CAUSE: What do you think is causing the dizziness? (e.g., decreased fluids or food, diarrhea, emotional distress, heat exposure, new medicine, sudden standing, vomiting;  unknown)     constipation 9. RECURRENT SYMPTOM: Have you had dizziness before? If Yes, ask: When was the last time? What happened that time?     No  10. OTHER SYMPTOMS: Do you have any other symptoms? (e.g., fever, chest pain, vomiting, diarrhea, bleeding)       Infrequent bowel movements, last bm 10/17/23-Miralax. Requesting referral to gi. Other symptoms:  Low energy.  Protocols used: Dizziness - Lightheadedness-A-AH

## 2023-10-18 NOTE — Telephone Encounter (Signed)
Routing to Amanda for review.

## 2023-10-19 ENCOUNTER — Encounter (HOSPITAL_BASED_OUTPATIENT_CLINIC_OR_DEPARTMENT_OTHER): Payer: Self-pay | Admitting: Family Medicine

## 2023-10-19 ENCOUNTER — Other Ambulatory Visit: Payer: Self-pay

## 2023-10-19 ENCOUNTER — Ambulatory Visit (INDEPENDENT_AMBULATORY_CARE_PROVIDER_SITE_OTHER): Admitting: Family Medicine

## 2023-10-19 VITALS — BP 170/72 | HR 65 | Ht 65.0 in | Wt 142.0 lb

## 2023-10-19 DIAGNOSIS — I10 Essential (primary) hypertension: Secondary | ICD-10-CM | POA: Diagnosis not present

## 2023-10-19 DIAGNOSIS — R42 Dizziness and giddiness: Secondary | ICD-10-CM | POA: Diagnosis not present

## 2023-10-19 DIAGNOSIS — R55 Syncope and collapse: Secondary | ICD-10-CM

## 2023-10-19 DIAGNOSIS — K5909 Other constipation: Secondary | ICD-10-CM | POA: Diagnosis not present

## 2023-10-19 NOTE — Telephone Encounter (Signed)
 Patient returned call from office and was transferred to this RN. Patient reports that he did not go to the ED and that his dizziness has resolved. Denies ataxia or blurry vision. Asked pt to sit for a few minutes and check his blood pressure. Reports: BP 173/73 and P 65. Second check was BP 149/66 P 67.  Regarding constipation, pt would like GI referral. States he has had chronic constipation. States he feels uncomfortable when sitting. Last BM was this morning but small and stringy. Has been taking Miralax, takes one capful per day. Is taking one colace in the AM and one in the PM.   As pt is not currently symptomatic, he was scheduled for a same day OV with PCP for f/u for BP and constipation. Advised to go to the ED if dizziness returns and reviewed rationales. Patient to bring his BP cuff with him to his appt as he reports he isn't sure how tight he should apply the cuff.

## 2023-10-19 NOTE — Telephone Encounter (Signed)
 Attempted to call pt but unable to reach. Left detailed VM on pt's phone with info from PCP and have also sent him a mychart.

## 2023-10-19 NOTE — Telephone Encounter (Signed)
 There is a conflict with the appt that was scheduled today 10/15 with PCP. If pt needs to be seen, he can be seen tomorrow 10/16 at 2:50 (have slots held for pt). Pt did state to E2C2 that he was currently asymptomatic.  Patient was just seen by PCP 10/8. If patient wants to have a referral for GI, patient can have that referral placed without an office visit.  Please reach back out to pt with this info as we can do the referral without him needing to be seen again especially with him saying he is now asymptomatic.

## 2023-10-19 NOTE — Progress Notes (Signed)
 Subjective:   Jonathan Savage [facility] 11/23/1940 10/19/2023  Chief Complaint  Patient presents with   Medical Management of Chronic Issues    Pt has been having problems with his BP which he wants to discuss and also is wanting a referral to GI due to the constipation.    HPI: Jonathan Savage presents today for re-assessment and management of chronic medical conditions.   CONSTIPATION:  Patient reports chronic constipation.  Patient pt did discuss constipation at appointment on 10/12/2023.  He was recommended to increase MiraLAX, fiber, Colace.  He states he is now having bowel movements daily, but that they are small and stringy.  He is concerned about a blockage in his colon and is requesting referral to GI.  He denies blood in the stools or dark black stools.  Denies any nausea, vomiting.  He was started on duloxetine for concerns of neuropathy and chronic pain as well as MDD and states that since starting this in the past week he has noticed an increase in diarrhea and has since stopped the medication.  He denies straining when having a bowel movement   HYPERTENSION: Jonathan Savage presents for the medical management of hypertension.  Patient states that he did have a near syncopal episode yesterday after having a bowel movement.  Patient's fluctuating dizziness and fluctuating hypertension has been addressed and managed by cardiology with Dr. Wadie.  He is scheduled for a carotid ultrasound coming up in November.  Previous carotid ultrasound showed no significant stenosis.  Patient was concerned at last visit of his diastolic pressure and requested changes to blood pressure medication.  His amlodipine  was reduced from 5 mg twice daily to 2.5 mg twice daily.  His systolic pressure has since increased and diastolic pressure has increased.  He has not noticed any changes with dizzines.  He does state dizziness occurs only at times of movement and position changing.  Reports  he does stay well-hydrated with fluids during the day.   Patient's current hypertension medication regimen is: amlodipine  2.5mg  BID (recently changed per patient request due to concern of dizziness after taking medication) hydralazine  10mg  BID, Irbesartan  75mg  and 150mg  daily  Patient is currently taking prescribed medications for HTN.  Patient is regularly keeping a check on BP at home.  Adhering to low sodium diet: Yes Exercising Regularly: No  Denies headache, dizziness, CP, SHOB, vision changes.      BP Readings from Last 3 Encounters:  10/19/23 (!) 170/72  10/12/23 (!) 142/70  09/27/23 (!) 140/50     The following portions of the patient's history were reviewed and updated as appropriate: past medical history, past surgical history, family history, social history, allergies, medications, and problem list.   Patient Active Problem List   Diagnosis Date Noted   Postural dizziness with near syncope 10/19/2023   Chronic constipation 10/19/2023   Neuropathy of both feet 05/09/2023   Numbness and tingling of both lower extremities 11/04/2022   Acute cough 10/25/2022   Lab test positive for detection of COVID-19 virus 10/25/2022   Erectile dysfunction 07/21/2022   Elevated serum creatinine 05/10/2022   CKD stage 3a, GFR 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) 05/10/2022   Prediabetes 10/29/2020   Carotid stenosis, asymptomatic 08/22/2019   Claudication in peripheral vascular disease 06/08/2019   Essential hypertension 06/05/2019   Hyperlipidemia 06/05/2019   Carotid bruit 06/05/2019   Past Medical History:  Diagnosis Date   Carotid artery occlusion    Complication of anesthesia    pt  states following last surgery he was awake for over 24 hours following surgery   Hypertension    Stroke [facility])    Past Surgical History:  Procedure Laterality Date   ENDARTERECTOMY Left 08/22/2019   Procedure: LEFT CAROTID ENDARTERECTOMY WITH BOVIE PATCH ANGIOPLASTY;  Surgeon: Serene Gaile ORN, MD;  Location: [facility]  OR;  Service: Vascular;  Laterality: Left;   LAPAROSCOPIC NEPHRECTOMY Right    NECK SURGERY     PROSTATE SURGERY     Family History  Problem Relation Age of Onset   Dementia Mother    Heart failure Father        Deteriration of the heart   Stroke Brother    Outpatient Medications Prior to Visit  Medication Sig Dispense Refill   amLODipine  (NORVASC ) 2.5 MG tablet Take 1 tablet (2.5 mg total) by mouth in the morning and at bedtime. 60 tablet 3   aspirin  EC 81 MG tablet Take 81 mg by mouth daily.     hydrALAZINE  (APRESOLINE ) 10 MG tablet TAKE 1 TABLET (10 MG TOTAL) BY MOUTH IN THE MORNING AND AT BEDTIME 180 tablet 1   irbesartan  (AVAPRO ) 150 MG tablet Take 1 tablet (150 mg total) by mouth daily. Alone with 75mg  OF Irbesartan  90 tablet 3   irbesartan  (AVAPRO ) 75 MG tablet Take 1 tablet (75 mg total) by mouth daily. 90 tablet 3   Omega 3-6-9 Fatty Acids (OMEGA 3-6-9 COMPLEX PO) Take by mouth.     rosuvastatin  (CRESTOR ) 10 MG tablet TAKE 1 TABLET BY MOUTH EVERY DAY 90 tablet 2   vitamin B-12 (CYANOCOBALAMIN) 1000 MCG tablet Take 1,000 mcg by mouth daily.     vitamin C (ASCORBIC ACID) 500 MG tablet Take 500 mg by mouth daily.     XDEMVY 0.25 % SOLN INSTILL 1 DROP INTO BOTH EYES TWICE DAILY FOR 6 WEEKS     DULoxetine (CYMBALTA) 30 MG capsule Take 1 capsule (30 mg total) by mouth at bedtime. 30 capsule 3   No facility-administered medications prior to visit.   Allergies  Allergen Reactions   Hydrochlorothiazide     Other reaction(s): Other (See Comments) Leg pain      ROS: A complete ROS was performed with pertinent positives/negatives noted in the HPI. The remainder of the ROS are negative.    Objective:   Today's Vitals   10/19/23 0926  BP: (!) 170/72  Pulse: 65  SpO2: 99%  Weight: 142 lb (64.4 kg)  Height: 5' 5 (1.651 m)    Physical Exam   GENERAL: Well-appearing, in NAD. Well nourished.  SKIN: Pink, warm and dry.   Head: Normocephalic. NECK: Trachea midline. Full  ROM w/o pain or tenderness. RESPIRATORY: Chest wall symmetrical. Respirations even and non-labored. Breath sounds clear to auscultation bilaterally.  CARDIAC: S1, S2 present, regular rate and rhythm without murmur or gallops. Peripheral pulses 2+ bilaterally.  Carotid arteries without bruit or thrill. MSK: Muscle tone and strength appropriate for age.  NEUROLOGIC: No motor or sensory deficits. Steady, even gait. C2-C12 intact.  PSYCH/MENTAL STATUS: Alert, oriented x 3. Cooperative, appropriate mood and affect.    EKG: 10/19/2023 Vent. rate 63 BPM PR interval 156 ms QRS duration 98 ms QT/QTcB 432/442 ms P-R-T axes 52 8 97  Normal sinus rhythm Nonspecific T wave abnormality Abnormal ECG When compared with ECG of 27-Sep-2023 10:24, No significant change was found    Assessment & Plan:  1. Chronic constipation (Primary) Discussed dietary changes, continue with MiraLAX, fiber and probiotic.  Given patient is  having a bowel movement daily, I discussed limited benefit to starting medication for chronic constipation.  Patient is requesting referral to GI for further evaluation.  Referral placed. - Ambulatory referral to Gastroenterology  2. Essential hypertension 3. Postural dizziness with near syncope Given fluctuating blood pressure and continuing postural dizziness, recommend patient follow-up with Dr. Wadie with cardiology regarding ongoing dizziness.  He will complete carotid study as scheduled and may need echo in the future.  Previous carotid study and echo were reviewed by PCP.  EKG today without any acute changes with comparison to EKG on September 27, 2023.  Lab work completed in the past week was unremarkable.  Recommend he return to amlodipine  5 mg twice daily for better blood pressure control and he was agreeable.  Discussed possibility of holding hydralazine  pending blood pressure reading at the time of administration and recommend he discuss this with Dr. Wadie prior to initiating.   Patient was agreeable and will discuss with cardiology.  No orders of the defined types were placed in this encounter.  Lab Orders  No laboratory test(s) ordered today   Return for As scheduled.    Patient to reach out to office if new, worrisome, or unresolved symptoms arise or if no improvement in patient's condition. Patient verbalized understanding and is agreeable to treatment plan. All questions answered to patient's satisfaction.   A total of 50 minutes were spent on this encounter today including face to face, ordering and reviewing labs, reviewing patient's previous records from Cardiology, documenting in the record and ordering  labs, medications and screenings.    Thersia Schuyler Stark, [place]

## 2023-10-19 NOTE — Patient Instructions (Addendum)
 Return to Amlodipine  5mg  twice daily. Stop the Amlodipine  2.5mg  twice daily.   Call Cardiology to schedule appt regarding dizziness.    We will discuss blood pressure medications with Dr. Court and call you.

## 2023-10-27 ENCOUNTER — Other Ambulatory Visit: Payer: Self-pay

## 2023-10-27 DIAGNOSIS — Z7982 Long term (current) use of aspirin: Secondary | ICD-10-CM | POA: Diagnosis not present

## 2023-10-27 DIAGNOSIS — Z79899 Other long term (current) drug therapy: Secondary | ICD-10-CM | POA: Diagnosis not present

## 2023-10-27 DIAGNOSIS — N289 Disorder of kidney and ureter, unspecified: Secondary | ICD-10-CM | POA: Diagnosis not present

## 2023-10-27 DIAGNOSIS — R1024 Suprapubic pain: Secondary | ICD-10-CM | POA: Diagnosis present

## 2023-10-27 LAB — COMPREHENSIVE METABOLIC PANEL WITH GFR
ALT: 11 U/L (ref 0–44)
AST: 23 U/L (ref 15–41)
Albumin: 4.4 g/dL (ref 3.5–5.0)
Alkaline Phosphatase: 78 U/L (ref 38–126)
Anion gap: 9 (ref 5–15)
BUN: 20 mg/dL (ref 8–23)
CO2: 26 mmol/L (ref 22–32)
Calcium: 10.4 mg/dL — ABNORMAL HIGH (ref 8.9–10.3)
Chloride: 98 mmol/L (ref 98–111)
Creatinine, Ser: 1.7 mg/dL — ABNORMAL HIGH (ref 0.61–1.24)
GFR, Estimated: 39 mL/min — ABNORMAL LOW (ref >60)
Glucose, Bld: 94 mg/dL (ref 70–99)
Potassium: 4.5 mmol/L (ref 3.5–5.1)
Sodium: 134 mmol/L — ABNORMAL LOW (ref 135–145)
Total Bilirubin: 0.2 mg/dL (ref 0.0–1.2)
Total Protein: 7.5 g/dL (ref 6.5–8.1)

## 2023-10-27 LAB — CBC WITH DIFFERENTIAL/PLATELET
Abs Immature Granulocytes: 0.02 K/uL (ref 0.00–0.07)
Basophils Absolute: 0.1 K/uL (ref 0.0–0.1)
Basophils Relative: 1 %
Eosinophils Absolute: 0.5 K/uL (ref 0.0–0.5)
Eosinophils Relative: 6 %
HCT: 40.5 % (ref 39.0–52.0)
Hemoglobin: 13.3 g/dL (ref 13.0–17.0)
Immature Granulocytes: 0 %
Lymphocytes Relative: 24 %
Lymphs Abs: 1.9 K/uL (ref 0.7–4.0)
MCH: 29.4 pg (ref 26.0–34.0)
MCHC: 32.8 g/dL (ref 30.0–36.0)
MCV: 89.6 fL (ref 80.0–100.0)
Monocytes Absolute: 1.1 K/uL — ABNORMAL HIGH (ref 0.1–1.0)
Monocytes Relative: 14 %
Neutro Abs: 4.3 K/uL (ref 1.7–7.7)
Neutrophils Relative %: 55 %
Platelets: 244 K/uL (ref 150–400)
RBC: 4.52 MIL/uL (ref 4.22–5.81)
RDW: 13.2 % (ref 11.5–15.5)
WBC: 7.9 K/uL (ref 4.0–10.5)
nRBC: 0 % (ref 0.0–0.2)

## 2023-10-27 NOTE — ED Triage Notes (Signed)
 Constipated x4 days. -N/-V.

## 2023-10-28 ENCOUNTER — Emergency Department (HOSPITAL_BASED_OUTPATIENT_CLINIC_OR_DEPARTMENT_OTHER)
Admission: EM | Admit: 2023-10-28 | Discharge: 2023-10-28 | Disposition: A | Discharge status: Home / Self Care | Attending: Emergency Medicine | Admitting: Emergency Medicine

## 2023-10-28 ENCOUNTER — Emergency Department (HOSPITAL_BASED_OUTPATIENT_CLINIC_OR_DEPARTMENT_OTHER)

## 2023-10-28 DIAGNOSIS — R1024 Suprapubic pain: Secondary | ICD-10-CM

## 2023-10-28 DIAGNOSIS — Z7982 Long term (current) use of aspirin: Secondary | ICD-10-CM | POA: Diagnosis not present

## 2023-10-28 DIAGNOSIS — N289 Disorder of kidney and ureter, unspecified: Secondary | ICD-10-CM

## 2023-10-28 LAB — URINALYSIS, ROUTINE W REFLEX MICROSCOPIC
Bilirubin Urine: NEGATIVE
Glucose, UA: NEGATIVE mg/dL
Hgb urine dipstick: NEGATIVE
Ketones, ur: NEGATIVE mg/dL
Leukocytes,Ua: NEGATIVE
Nitrite: NEGATIVE
Protein, ur: NEGATIVE mg/dL
Specific Gravity, Urine: 1.006 (ref 1.005–1.030)
pH: 5 (ref 5.0–8.0)

## 2023-10-28 MED ORDER — SODIUM CHLORIDE 0.9 % IV BOLUS
1000.0000 mL | Freq: Once | INTRAVENOUS | Status: AC
  Administered 2023-10-28: 1000 mL via INTRAVENOUS

## 2023-10-28 NOTE — ED Provider Notes (Signed)
 [facility] EMERGENCY DEPARTMENT AT [facility] Provider Note   CSN: [id number] Arrival date & time: 10/27/23  2206     Patient presents with: Constipation   Jonathan Savage is a 83 y.o. male.   The history is provided by the patient and a relative.  Constipation Severity:  Moderate Time since last bowel movement:  4 days Timing:  Constant Progression:  Worsening Chronicity:  Recurrent Relieved by:  Nothing Associated symptoms: abdominal pain   Associated symptoms: no diarrhea, no fever, no nausea and no vomiting   Patient with history of hypertension presents for constipation.  Patient reports she has had no bowel movement for the past 4 days.  he reports lower abdominal discomfort.  No fevers or vomiting.  He is not on chronic pain medicine He has been taking MiraLAX without relief He has a history of chronic constipation but this is worse     Prior to Admission medications   Medication Sig Start Date End Date Taking? Authorizing Provider  amLODipine  (NORVASC ) 2.5 MG tablet Take 1 tablet (2.5 mg total) by mouth in the morning and at bedtime. 10/12/23   Caudle, Thersia Bitters, FNP  aspirin  EC 81 MG tablet Take 81 mg by mouth daily.    [provider]  hydrALAZINE  (APRESOLINE ) 10 MG tablet TAKE 1 TABLET (10 MG TOTAL) BY MOUTH IN THE MORNING AND AT BEDTIME 10/09/23   Caudle, Thersia Bitters, FNP  irbesartan  (AVAPRO ) 150 MG tablet Take 1 tablet (150 mg total) by mouth daily. Alone with 75mg  OF Irbesartan  09/21/23   Court Dorn PARAS, MD  irbesartan  (AVAPRO ) 75 MG tablet Take 1 tablet (75 mg total) by mouth daily. 09/21/23   Court Dorn PARAS, MD  Omega 3-6-9 Fatty Acids (OMEGA 3-6-9 COMPLEX PO) Take by mouth.    [provider]  rosuvastatin  (CRESTOR ) 10 MG tablet TAKE 1 TABLET BY MOUTH EVERY DAY 01/19/23   Court Dorn PARAS, MD  vitamin B-12 (CYANOCOBALAMIN) 1000 MCG tablet Take 1,000 mcg by mouth daily.    [provider]  vitamin C (ASCORBIC  ACID) 500 MG tablet Take 500 mg by mouth daily.    [provider]  XDEMVY 0.25 % SOLN INSTILL 1 DROP INTO BOTH EYES TWICE DAILY FOR 6 WEEKS 10/10/23   [provider]    Allergies: Hydrochlorothiazide    Review of Systems  Constitutional:  Negative for fever.  Gastrointestinal:  Positive for abdominal pain and constipation. Negative for diarrhea, nausea and vomiting.    Updated Vital Signs BP (!) 161/74   Pulse 64   Temp 98.2 F (36.8 C)   Resp 18   SpO2 99%   Physical Exam CONSTITUTIONAL: Elderly, no acute distress HEAD: Normocephalic/atraumatic EYES: EOMI/PERRL ENMT: Mucous membranes moist NECK: supple no meningeal signs CV: S1/S2 noted, no murmurs/rubs/gallops noted LUNGS: Lungs are clear to auscultation bilaterally, no apparent distress ABDOMEN: soft, mild suprapubic, no rebound or guarding, bowel sounds noted throughout abdomen Rectal-chaperoned by nurse Jonathan Savage No blood or melena.  No rectal mass or abscess.  There is no stool impaction NEURO: Pt is awake/alert/appropriate, moves all extremitiesx4.  No facial droop.   EXTREMITIES: pulses normal/equal, full ROM SKIN: warm, color normal PSYCH: no abnormalities of mood noted, alert and oriented to situation  (all labs ordered are listed, but only abnormal results are displayed) Labs Reviewed  CBC WITH DIFFERENTIAL/PLATELET - Abnormal; Notable for the following components:      Result Value   Monocytes Absolute 1.1 (*)    All  other components within normal limits  COMPREHENSIVE METABOLIC PANEL WITH GFR - Abnormal; Notable for the following components:   Sodium 134 (*)    Creatinine, Ser 1.70 (*)    Calcium  10.4 (*)    GFR, Estimated 39 (*)    All other components within normal limits  URINALYSIS, ROUTINE W REFLEX MICROSCOPIC - Abnormal; Notable for the following components:   Color, Urine COLORLESS (*)    All other components within normal limits    EKG: None  Radiology: CT ABDOMEN PELVIS WO  CONTRAST Result Date: 10/28/2023 CLINICAL DATA:  Abdominal pain and symptoms of constipation EXAM: CT ABDOMEN AND PELVIS WITHOUT CONTRAST TECHNIQUE: Multidetector CT imaging of the abdomen and pelvis was performed following the standard protocol without IV contrast. RADIATION DOSE REDUCTION: This exam was performed according to the departmental dose-optimization program which includes automated exposure control, adjustment of the mA and/or kV according to patient size and/or use of iterative reconstruction technique. COMPARISON:  09/25/2020 FINDINGS: Lower chest: Calcifications along the right hemidiaphragm are noted stable from 2022 consistent with a benign etiology. Hepatobiliary: Gallbladder is well distended with evidence of a single calcified gallstone. The liver shows scattered hypodensities likely representing cysts stable from the prior exam. Pancreas: Unremarkable. No pancreatic ductal dilatation or surrounding inflammatory changes. Spleen: Normal in size without focal abnormality. 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 The adrenal glands are within normal limits. Left kidney demonstrates mild fullness of the collecting system which extends inferiorly although no definitive stone is seen. The bladder is partially distended. Stomach/Bowel: Scattered diverticular change of the colon is noted. No obstructive changes are seen. The appendix is within normal limits. Small bowel and stomach are within normal limits. Vascular/Lymphatic: Aortic atherosclerosis. No enlarged abdominal or pelvic lymph nodes. Reproductive: Prostate is unremarkable. Other: No abdominal wall hernia or abnormality. No abdominopelvic ascites. Musculoskeletal: No acute or significant osseous findings. IMPRESSION: Diverticulosis without diverticulitis. Mild fullness of the left renal collecting system although no definitive stone is seen. This may be related to the distended bladder. Cholelithiasis without  complicating factors. No definitive constipation is noted. Electronically Signed   By: Oneil Devonshire M.D.   On: 10/28/2023 02:33     Procedures   Medications Ordered in the ED  sodium chloride  0.9 % bolus 1,000 mL (1,000 mLs Intravenous New Bag/Given 10/28/23 0200)    Clinical Course as of 10/28/23 0318  Fri Oct 28, 2023  0317 Patient came for concerning constipation for 4 days but no vomiting and minimal abdominal pain CT scan was overall reassuring.  Patient did have some evidence of urinary retention that is resolved and he is passing urine without difficulty.  That may have contributed some of his discomfort Advised to continue Colace and prune juice and follow with his PCP  This was discussed with patient as well as his grandson Con via the phone who patient gives me permission to speak to [DW]    Clinical Course User Index [DW] Midge Golas, MD                                 Medical Decision Making Amount and/or Complexity of Data Reviewed Labs: ordered. Radiology: ordered.   This patient presents to the ED for concern of abdominal pain and constipation, this involves an extensive number of treatment options, and is a complaint that carries with it a high risk of complications and morbidity.  The differential diagnosis includes  but is not limited to cholecystitis, cholelithiasis, pancreatitis, gastritis, peptic ulcer disease, appendicitis, bowel obstruction, bowel perforation, diverticulitis, AAA, ischemic bowel    Comorbidities that complicate the patient evaluation: Patient's presentation is complicated by their history of hypertension   Additional history obtained: Additional history obtained from family Records reviewed Primary Care Documents  Lab Tests: I Ordered, and personally interpreted labs.  The pertinent results include: Acute on chronic renal sufficiency  Imaging Studies ordered: I ordered imaging studies including CT scan abdomen pelvis  I  independently visualized and interpreted imaging which showed no acute findings, partially distended bladder I agree with the radiologist interpretation Patient declined IV contrast due to underlying renal sufficiency and previous nephrectomy   Reevaluation: After the interventions noted above, I reevaluated the patient and found that they have :improved  Complexity of problems addressed: Patient's presentation is most consistent with  acute presentation with potential threat to life or bodily function  Disposition: After consideration of the diagnostic results and the patient's response to treatment,  I feel that the patent would benefit from discharge  .   Patient overall well-appearing.  No distress.  He is ambulatory.  No vomiting      Final diagnoses:  Suprapubic pain  Renal insufficiency    ED Discharge Orders     None          Midge Golas, MD 10/28/23 [phone]

## 2023-10-28 NOTE — Discharge Instructions (Addendum)
 You can stop the MiraLAX for now, and take the prune juice and Colace as we discussed  Follow-up with your primary doctor next week   SEEK IMMEDIATE MEDICAL ATTENTION IF: The pain does not go away or becomes severe, particularly over the next 8-12 hours.  A temperature above 100.74F develops.  Repeated vomiting occurs (multiple episodes).  The pain becomes localized to portions of the abdomen. The right side could possibly be appendicitis. In an adult, the left lower portion of the abdomen could be colitis or diverticulitis.  Blood is being passed in stools or vomit (bright red or black tarry stools).  Return also if you develop chest pain, difficulty breathing, dizziness or fainting, or become confused, poorly responsive, or inconsolable.

## 2023-10-28 NOTE — ED Notes (Signed)
 Patient transported to CT

## 2023-11-07 ENCOUNTER — Ambulatory Visit (HOSPITAL_COMMUNITY)
Admission: RE | Admit: 2023-11-07 | Discharge: 2023-11-07 | Disposition: A | Discharge status: Home / Self Care | Source: Ambulatory Visit | Attending: Surgery | Admitting: Surgery

## 2023-11-07 ENCOUNTER — Ambulatory Visit: Admitting: Physician Assistant

## 2023-11-07 VITALS — BP 159/79 | HR 62 | Temp 98.0°F | Wt 145.6 lb

## 2023-11-07 DIAGNOSIS — I6523 Occlusion and stenosis of bilateral carotid arteries: Secondary | ICD-10-CM | POA: Insufficient documentation

## 2023-11-07 DIAGNOSIS — Z9889 Other specified postprocedural states: Secondary | ICD-10-CM | POA: Diagnosis not present

## 2023-11-07 NOTE — Progress Notes (Signed)
 Office Note     CC:  follow up Requesting Provider:  Knute Thersia Bitters, *  HPI: Jonathan Savage is a 83 y.o. (September 22, 1940) male who presents for routine follow up of carotid artery stenosis. He has history of left CEA by Dr. Serene on 08/22/19 for asymptomatic high grade stenosis.   At his visit last year he was doing well without any new TIA or stroke like symptoms. He was complaining of bilateral weakness and numbness in his feet. Today he reports that he continues to have these issues. 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 He says he belongs to the [facility] and use to enjoy going to workout regularly but it has been harder now with his leg weakness. He also was doing PT at one point but is not currently. He does not describe any claudication, rest pain or tissue loss. He does not  have any visual changes, slurred speech, facial drooping, unilateral upper or lower extremity weakness or numbness. He is medically managed on Aspirin  and Statin Past Medical History:  Diagnosis Date   Carotid artery occlusion    Complication of anesthesia    pt states following last surgery he was awake for over 24 hours following surgery   Hypertension    Stroke [facility])     Past Surgical History:  Procedure Laterality Date   ENDARTERECTOMY Left 08/22/2019   Procedure: LEFT CAROTID ENDARTERECTOMY WITH BOVIE PATCH ANGIOPLASTY;  Surgeon: Serene Gaile ORN, MD;  Location: [facility] OR;  Service: Vascular;  Laterality: Left;   LAPAROSCOPIC NEPHRECTOMY Right    NECK SURGERY     PROSTATE SURGERY      Social History   Socioeconomic History   Marital status: Widowed    Spouse name: Not on file   Number of children: 3   Years of education: Not on file   Highest education level: Not on file  Occupational History   Not on file  Tobacco Use   Smoking status: Former   Smokeless tobacco: Never  Vaping Use   Vaping status: Never Used  Substance and  Sexual Activity   Alcohol use: Never   Drug use: Never   Sexual activity: Not Currently  Other Topics Concern   Not on file  Social History Narrative   Right Handed    Lives in a two story home    Father to 3 girls    Social Drivers of Corporate Investment Banker Strain: Low Risk  (05/03/2023)   Overall Financial Resource Strain (CARDIA)    Difficulty of Paying Living Expenses: Not hard at all  Food Insecurity: No Food Insecurity (05/03/2023)   Hunger Vital Sign    Worried About Running Out of Food in the Last Year: Never true    Ran Out of Food in the Last Year: Never true  Transportation Needs: No Transportation Needs (05/03/2023)   PRAPARE - Administrator, Civil Service (Medical): No    Lack of Transportation (Non-Medical): No  Physical Activity: Insufficiently Active (05/03/2023)   Exercise Vital Sign    Days of Exercise per Week: 2 days    Minutes of Exercise per Session: 40 min  Stress: No Stress Concern Present (05/03/2023)   [employer] of Occupational Health - Occupational Stress Questionnaire    Feeling of Stress : Not at all  Social Connections: Socially Integrated (05/03/2023)   Social Connection and Isolation Panel    Frequency of Communication with Friends and Family:  More than three times a week    Frequency of Social Gatherings with Friends and Family: More than three times a week    Attends Religious Services: More than 4 times per year    Active Member of [employer] or Organizations: Yes    Attends Engineer, Structural: More than 4 times per year    Marital Status: Married  Catering Manager Violence: Not At Risk (05/03/2023)   Humiliation, Afraid, Rape, and Kick questionnaire    Fear of Current or Ex-Partner: No    Emotionally Abused: No    Physically Abused: No    Sexually Abused: No    Family History  Problem Relation Age of Onset   Dementia Mother    Heart failure Father        Deteriration of the heart   Stroke Brother      Current Outpatient Medications  Medication Sig Dispense Refill   amLODipine  (NORVASC ) 2.5 MG tablet Take 1 tablet (2.5 mg total) by mouth in the morning and at bedtime. 60 tablet 3   aspirin  EC 81 MG tablet Take 81 mg by mouth daily.     hydrALAZINE  (APRESOLINE ) 10 MG tablet TAKE 1 TABLET (10 MG TOTAL) BY MOUTH IN THE MORNING AND AT BEDTIME 180 tablet 1   irbesartan  (AVAPRO ) 150 MG tablet Take 1 tablet (150 mg total) by mouth daily. Alone with 75mg  OF Irbesartan  90 tablet 3   irbesartan  (AVAPRO ) 75 MG tablet Take 1 tablet (75 mg total) by mouth daily. 90 tablet 3   Omega 3-6-9 Fatty Acids (OMEGA 3-6-9 COMPLEX PO) Take by mouth.     rosuvastatin  (CRESTOR ) 10 MG tablet TAKE 1 TABLET BY MOUTH EVERY DAY 90 tablet 2   vitamin B-12 (CYANOCOBALAMIN) 1000 MCG tablet Take 1,000 mcg by mouth daily.     vitamin C (ASCORBIC ACID) 500 MG tablet Take 500 mg by mouth daily.     XDEMVY 0.25 % SOLN INSTILL 1 DROP INTO BOTH EYES TWICE DAILY FOR 6 WEEKS     No current facility-administered medications for this visit.    Allergies  Allergen Reactions   Hydrochlorothiazide     Other reaction(s): Other (See Comments) Leg pain      REVIEW OF SYSTEMS:  Negative unless noted in HPI [X]  denotes positive finding, [ ]  denotes negative finding Cardiac  Comments:  Chest pain or chest pressure:    Shortness of breath upon exertion:    Short of breath when lying flat:    Irregular heart rhythm:        Vascular    Pain in calf, thigh, or hip brought on by ambulation:    Pain in feet at night that wakes you up from your sleep:     Blood clot in your veins:    Leg swelling:         Pulmonary    Oxygen at home:    Productive cough:     Wheezing:         Neurologic    Sudden weakness in arms or legs:     Sudden numbness in arms or legs:     Sudden onset of difficulty speaking or slurred speech:    Temporary loss of vision in one eye:     Problems with dizziness:         Gastrointestinal    Blood  in stool:     Vomited blood:         Genitourinary    Burning when  urinating:     Blood in urine:        Psychiatric    Major depression:         Hematologic    Bleeding problems:    Problems with blood clotting too easily:        Skin    Rashes or ulcers:        Constitutional    Fever or chills:      PHYSICAL EXAMINATION:  Vitals:   11/07/23 1247 11/07/23 1248  BP: (!) 167/79 (!) 159/79  Pulse: 67 62  Temp: 98 F (36.7 C)   TempSrc: Temporal   Weight: 145 lb 9.6 oz (66 kg)    General:  WDWN in NAD; vital signs documented above Gait: Normal HENT: WNL, normocephalic Pulmonary: normal non-labored breathing Cardiac: regular HR Abdomen: soft Vascular Exam/Pulses: 2+ radial pulses, 2+ DP pulses bilaterally Extremities: without ischemic changes, without Gangrene , without cellulitis; without open wounds;  Musculoskeletal: no muscle wasting or atrophy  Neurologic: A&O X 3 Psychiatric:  The pt has Normal affect.   Non-Invasive Vascular Imaging:   VAS US  Carotid: Summary:  Right Carotid: Velocities in the right ICA are consistent with a 1-39% stenosis.   Left Carotid: Velocities in the left ICA are consistent with a 1-39% stenosis.   Vertebrals: Bilateral vertebral arteries demonstrate antegrade flow.  Subclavians: Normal flow hemodynamics were seen in bilateral subclavian arteries.    ASSESSMENT/PLAN:: 83 y.o. male here for follow up for carotid artery stenosis. He has history of left CEA by Dr. Serene on 08/22/19 for asymptomatic high grade stenosis. He is without any TIA or stroke like symptoms. He continues to have complaints about neuropathy and weakness in his legs.  - Duplex today shows 1-39% ICA stenosis bilaterally. Normal flow in the vertebral and subclavian arteries bilaterally - Encouraged him to continue his exercise regimen - Continue Aspirin  and statin - he will follow up in 1 year with carotid duplex   Teretha Damme, PA-C [facility] [phone]  Clinic MD:   Serene

## 2023-11-10 ENCOUNTER — Encounter (HOSPITAL_BASED_OUTPATIENT_CLINIC_OR_DEPARTMENT_OTHER): Payer: Self-pay | Admitting: Family Medicine

## 2023-11-10 ENCOUNTER — Ambulatory Visit (HOSPITAL_BASED_OUTPATIENT_CLINIC_OR_DEPARTMENT_OTHER): Admitting: Family Medicine

## 2023-11-10 VITALS — BP 136/67 | HR 66 | Ht 65.0 in | Wt 143.0 lb

## 2023-11-10 DIAGNOSIS — K5790 Diverticulosis of intestine, part unspecified, without perforation or abscess without bleeding: Secondary | ICD-10-CM | POA: Insufficient documentation

## 2023-11-10 DIAGNOSIS — K5909 Other constipation: Secondary | ICD-10-CM | POA: Diagnosis not present

## 2023-11-10 DIAGNOSIS — K602 Anal fissure, unspecified: Secondary | ICD-10-CM | POA: Diagnosis not present

## 2023-11-10 LAB — POCT OCCULT BLOOD STOOL, DEVICE

## 2023-11-10 NOTE — Progress Notes (Signed)
 Subjective:   Jonathan Savage [facility] 24-Feb-1940 11/10/2023  Chief Complaint  Patient presents with   Medical Management of Chronic Issues    4-week follow up; states BP has been doing okay since last visit. Has been having problems with rectum as he said he has been having some problems having a bowel movement and when he does have one, states that he does have pain.    Discussed the use of AI scribe software for clinical note transcription with the patient, who gave verbal consent to proceed.  History of Present Illness Jonathan Savage is an 83 year old male who presents with constipation and rectal discomfort.  He has been experiencing ongoing constipation and rectal discomfort. A recent visit to the ER on October 24th included a CT scan and rectal exam; the patient recalls being told there was nothing concerning found. Despite this, he continues to experience discomfort, particularly during bowel movements, which he describes as painful and not normal in size. No blood is present when wiping, and he is unsure about the presence of hemorrhoids.  He describes the pain during bowel movements as similar to having a sore nose and inserting a finger, indicating discomfort rather than sharp pain. The pain is not excruciating but is uncomfortable and aggravating, especially when sitting or driving for long distances.  He manages his bowel movements by consuming smoothies with high fiber content and drinking orange juice, which helps him have a bowel movement 15-20 minutes later. He has started taking Colace, a stool softener, once daily. He also uses a footstool to aid in bowel movements, which he finds helpful.  He has not experienced hemorrhoids in the past but acknowledges recent straining during bowel movements. He uses Vaseline regularly for relief but does not apply it internally. No blood in the stool or on wiping. He experiences discomfort during bowel movements but no sharp or  tearing sensations.  The following portions of the patient's history were reviewed and updated as appropriate: past medical history, past surgical history, family history, social history, allergies, medications, and problem list.   Patient Active Problem List   Diagnosis Date Noted   Diverticulosis 11/10/2023   Postural dizziness with near syncope 10/19/2023   Chronic constipation 10/19/2023   Neuropathy of both feet 05/09/2023   Numbness and tingling of both lower extremities 11/04/2022   Acute cough 10/25/2022   Lab test positive for detection of COVID-19 virus 10/25/2022   Erectile dysfunction 07/21/2022   Elevated serum creatinine 05/10/2022   CKD stage 3a, GFR 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) 05/10/2022   Prediabetes 10/29/2020   Carotid stenosis, asymptomatic 08/22/2019   Claudication in peripheral vascular disease 06/08/2019   Essential hypertension 06/05/2019   Hyperlipidemia 06/05/2019   Carotid bruit 06/05/2019   Past Medical History:  Diagnosis Date   Carotid artery occlusion    Complication of anesthesia    pt states following last surgery he was awake for over 24 hours following surgery   Hypertension    Stroke [facility])    Past Surgical History:  Procedure Laterality Date   ENDARTERECTOMY Left 08/22/2019   Procedure: LEFT CAROTID ENDARTERECTOMY WITH BOVIE PATCH ANGIOPLASTY;  Surgeon: Serene Gaile ORN, MD;  Location: [facility] OR;  Service: Vascular;  Laterality: Left;   LAPAROSCOPIC NEPHRECTOMY Right    NECK SURGERY     PROSTATE SURGERY     Family History  Problem Relation Age of Onset   Dementia Mother    Heart failure Father  Deteriration of the heart   Stroke Brother    Outpatient Medications Prior to Visit  Medication Sig Dispense Refill   amLODipine  (NORVASC ) 2.5 MG tablet Take 1 tablet (2.5 mg total) by mouth in the morning and at bedtime. 60 tablet 3   aspirin  EC 81 MG tablet Take 81 mg by mouth daily.     hydrALAZINE  (APRESOLINE ) 10 MG tablet TAKE 1 TABLET (10 MG  TOTAL) BY MOUTH IN THE MORNING AND AT BEDTIME 180 tablet 1   irbesartan  (AVAPRO ) 150 MG tablet Take 1 tablet (150 mg total) by mouth daily. Alone with 75mg  OF Irbesartan  90 tablet 3   irbesartan  (AVAPRO ) 75 MG tablet Take 1 tablet (75 mg total) by mouth daily. 90 tablet 3   Omega 3-6-9 Fatty Acids (OMEGA 3-6-9 COMPLEX PO) Take by mouth.     rosuvastatin  (CRESTOR ) 10 MG tablet TAKE 1 TABLET BY MOUTH EVERY DAY 90 tablet 2   vitamin B-12 (CYANOCOBALAMIN) 1000 MCG tablet Take 1,000 mcg by mouth daily.     vitamin C (ASCORBIC ACID) 500 MG tablet Take 500 mg by mouth daily.     XDEMVY 0.25 % SOLN INSTILL 1 DROP INTO BOTH EYES TWICE DAILY FOR 6 WEEKS     No facility-administered medications prior to visit.   Allergies  Allergen Reactions   Hydrochlorothiazide     Other reaction(s): Other (See Comments) Leg pain      ROS: A complete ROS was performed with pertinent positives/negatives noted in the HPI. The remainder of the ROS are negative.    Objective:   Today's Vitals   11/10/23 1044  BP: 136/67  Pulse: 66  SpO2: 100%  Weight: 143 lb (64.9 kg)  Height: 5' 5 (1.651 m)    Physical Exam   GENERAL: Well-appearing, in NAD. Well nourished.  SKIN: Pink, warm and dry.  Head: Normocephalic. NECK: Trachea midline. Full ROM w/o pain or tenderness.  RESPIRATORY: Chest wall symmetrical. Respirations even and non-labored.  MSK: Muscle tone and strength appropriate for age.  GI: Abdomen soft, non-tender. No rebound tenderness. No hepatomegaly or splenomegaly. No CVA tenderness.  EXTREMITIES: Without clubbing, cyanosis, or edema.  GU: No penile lesions or swelling. No scrotal swelling or discoloration. Testes descended bilaterally. Rectal tone intact, no visible external or internal palpable hemorrhoids. No significant tearing or fissure on external exam.  Chaperoned by : Rosina Ada, DNP Student RN BSN NEUROLOGIC: No motor or sensory deficits. Steady, even gait. C2-C12 intact.   PSYCH/MENTAL STATUS: Alert, oriented x 3. Cooperative, appropriate mood and affect.    Results for orders placed or performed in visit on 11/10/23  POCT occult blood stool, device  Result Value Ref Range   Negative         Assessment & Plan:  1. Chronic constipation (Primary) 2. Anal fissure Patient doing well currently with regular bowel movements with regimen. Concern for possible anal irriation or fissure but not well visualized on limited exam. No blood in stool. Recommend conservative treatment currently with sitz baths, topical cream to rectal area externally and measures to alleviate constipation. Given patient's hx of dizziness and labile BP, I am cautious to trial nifedipine cream to rectum and patient is agreeable. Will proceed with GI visit in December and reach out to Pcp prior if no improvement or worsening.  - POCT occult blood stool, device   No orders of the defined types were placed in this encounter.  Lab Orders  No laboratory test(s) ordered today    Return  in about 3 months (around 02/10/2024) for Follow up Chronic Conditions. .    Patient to reach out to office if new, worrisome, or unresolved symptoms arise or if no improvement in patient's condition. Patient verbalized understanding and is agreeable to treatment plan. All questions answered to patient's satisfaction.    Thersia Schuyler Stark, [place]

## 2023-11-10 NOTE — Patient Instructions (Addendum)
 Sitz Bath - twice daily for 10-15 minutes Continue taking Colace daily  Vaseline and Hydrocortisone cream to outside of rectum. Do not place inside.   Call the office and ask for Damien if no improvement or worsening

## 2023-11-16 ENCOUNTER — Ambulatory Visit (HOSPITAL_BASED_OUTPATIENT_CLINIC_OR_DEPARTMENT_OTHER)

## 2023-11-16 ENCOUNTER — Ambulatory Visit (INDEPENDENT_AMBULATORY_CARE_PROVIDER_SITE_OTHER): Admitting: Student

## 2023-11-16 DIAGNOSIS — R29898 Other symptoms and signs involving the musculoskeletal system: Secondary | ICD-10-CM | POA: Diagnosis not present

## 2023-11-16 NOTE — Progress Notes (Signed)
 Chief Complaint: Bilateral leg pain and numbness     History of Present Illness:    Jonathan Savage is a 83 y.o. male who presents today for evaluation of pain and weakness in bilateral lower extremities.  This has been ongoing for the last 3 months and has been gradually worsening, preventing him from engaging in physical activity.  He enjoys staying active with exercise classes at the [facility].  Pain is typically relieved by rest although when aggravated it is described as weakness, burning, numbness, and dull pain.  Symptoms are generally present from the knees to the feet without much involvement of the thighs.  He does have some discomfort in the right side of his low back and does have history of right-sided sciatica.  Symptoms are generally better at rest although he does note some numbness and tingling in both feet.  Tries to manage symptoms with rest and is not taking any medication for this.   Surgical History:   No lumbar or lower extremity history  PMH/PSH/Family History/Social History/Meds/Allergies:    Past Medical History:  Diagnosis Date   Carotid artery occlusion    Complication of anesthesia    pt states following last surgery he was awake for over 24 hours following surgery   Hypertension    Stroke [facility])    Past Surgical History:  Procedure Laterality Date   ENDARTERECTOMY Left 08/22/2019   Procedure: LEFT CAROTID ENDARTERECTOMY WITH BOVIE PATCH ANGIOPLASTY;  Surgeon: Serene Gaile ORN, MD;  Location: [facility] OR;  Service: Vascular;  Laterality: Left;   LAPAROSCOPIC NEPHRECTOMY Right    NECK SURGERY     PROSTATE SURGERY     Social History   Socioeconomic History   Marital status: Widowed    Spouse name: Not on file   Number of children: 3   Years of education: Not on file   Highest education level: Not on file  Occupational History   Not on file  Tobacco Use   Smoking status: Former   Smokeless tobacco: Never  Vaping Use    Vaping status: Never Used  Substance and Sexual Activity   Alcohol use: Never   Drug use: Never   Sexual activity: Not Currently  Other Topics Concern   Not on file  Social History Narrative   Right Handed    Lives in a two story home    Father to 3 girls    Social Drivers of Corporate Investment Banker Strain: Low Risk  (05/03/2023)   Overall Financial Resource Strain (CARDIA)    Difficulty of Paying Living Expenses: Not hard at all  Food Insecurity: No Food Insecurity (05/03/2023)   Hunger Vital Sign    Worried About Running Out of Food in the Last Year: Never true    Ran Out of Food in the Last Year: Never true  Transportation Needs: No Transportation Needs (05/03/2023)   PRAPARE - Administrator, Civil Service (Medical): No    Lack of Transportation (Non-Medical): No  Physical Activity: Insufficiently Active (05/03/2023)   Exercise Vital Sign    Days of Exercise per Week: 2 days    Minutes of Exercise per Session: 40 min  Stress: No Stress Concern Present (05/03/2023)   [employer] of Occupational Health - Occupational Stress Questionnaire    Feeling of Stress :  Not at all  Social Connections: Socially Integrated (05/03/2023)   Social Connection and Isolation Panel    Frequency of Communication with Friends and Family: More than three times a week    Frequency of Social Gatherings with Friends and Family: More than three times a week    Attends Religious Services: More than 4 times per year    Active Member of [employer] or Organizations: Yes    Attends Engineer, Structural: More than 4 times per year    Marital Status: Married   Family History  Problem Relation Age of Onset   Dementia Mother    Heart failure Father        Deteriration of the heart   Stroke Brother    Allergies  Allergen Reactions   Hydrochlorothiazide     Other reaction(s): Other (See Comments) Leg pain    Current Outpatient Medications  Medication Sig Dispense Refill    amLODipine  (NORVASC ) 2.5 MG tablet Take 1 tablet (2.5 mg total) by mouth in the morning and at bedtime. 60 tablet 3   aspirin  EC 81 MG tablet Take 81 mg by mouth daily.     hydrALAZINE  (APRESOLINE ) 10 MG tablet TAKE 1 TABLET (10 MG TOTAL) BY MOUTH IN THE MORNING AND AT BEDTIME 180 tablet 1   irbesartan  (AVAPRO ) 150 MG tablet Take 1 tablet (150 mg total) by mouth daily. Alone with 75mg  OF Irbesartan  90 tablet 3   irbesartan  (AVAPRO ) 75 MG tablet Take 1 tablet (75 mg total) by mouth daily. 90 tablet 3   Omega 3-6-9 Fatty Acids (OMEGA 3-6-9 COMPLEX PO) Take by mouth.     rosuvastatin  (CRESTOR ) 10 MG tablet TAKE 1 TABLET BY MOUTH EVERY DAY 90 tablet 2   vitamin B-12 (CYANOCOBALAMIN) 1000 MCG tablet Take 1,000 mcg by mouth daily.     vitamin C (ASCORBIC ACID) 500 MG tablet Take 500 mg by mouth daily.     XDEMVY 0.25 % SOLN INSTILL 1 DROP INTO BOTH EYES TWICE DAILY FOR 6 WEEKS     No current facility-administered medications for this visit.   No results found.  Review of Systems:   A ROS was performed including pertinent positives and negatives as documented in the HPI.  Physical Exam :   Constitutional: NAD and appears stated age Neurological: Alert and oriented Psych: Appropriate affect and cooperative There were no vitals taken for this visit.   Comprehensive Musculoskeletal Exam:    Mild tenderness to palpation in the right lumbar region without midline tenderness.  Fluid and full passive hip range of motion bilaterally.  Negative straight leg raise bilaterally.  Bilateral knee flexion/extension ankle dorsiflexion/plantarflexion strength is 5/5.  Inspection of bilateral calfs and ankles demonstrates no significant skin changes or hair loss.  There are diffuse varicosities present.  Imaging:    Assessment:   83 y.o. male with 52-month history of bilateral leg weakness, pain, and paresthesias.  This is predominantly in the bilateral lower legs and typically worsens with activity.  He does  have some right-sided low back pain and mild degenerative disc disease seen on lumbar x-rays from 1 year ago.  Given his history of hyperlipidemia and hypertension and former history of smoking I do have suspicion this is more a result of vascular claudication given that symptoms worsen with activity improved with rest.  Unable to rule out potential lumbar involvement as he does have some low back pain.  No appreciated weakness on exam today.  Patient would like to be able  to get back into exercise at the [facility], therefore I believe he would benefit from physical therapy to help with strengthening and activity progression.  Should symptoms continue to persist or worsen, particularly during activity, would likely then recommend follow-up with his vascular team for evaluation of PAD.  Otherwise can also consider further lumbar workup if needed.  Plan :    - Referral to physical therapy for general strengthening and progression back into physical activity - Follow-up within 4 weeks of PT if symptoms persist or worsen     I personally saw and evaluated the patient, and participated in the management and treatment plan.  Leonce Reveal, PA-C Orthopedics

## 2023-11-18 ENCOUNTER — Ambulatory Visit: Attending: Student | Admitting: Rehabilitative and Restorative Service Providers"

## 2023-11-18 ENCOUNTER — Other Ambulatory Visit: Payer: Self-pay

## 2023-11-18 ENCOUNTER — Encounter: Payer: Self-pay | Admitting: Rehabilitative and Restorative Service Providers"

## 2023-11-18 DIAGNOSIS — R252 Cramp and spasm: Secondary | ICD-10-CM | POA: Insufficient documentation

## 2023-11-18 DIAGNOSIS — M62838 Other muscle spasm: Secondary | ICD-10-CM | POA: Diagnosis present

## 2023-11-18 DIAGNOSIS — R262 Difficulty in walking, not elsewhere classified: Secondary | ICD-10-CM | POA: Insufficient documentation

## 2023-11-18 DIAGNOSIS — R278 Other lack of coordination: Secondary | ICD-10-CM | POA: Diagnosis present

## 2023-11-18 DIAGNOSIS — M542 Cervicalgia: Secondary | ICD-10-CM | POA: Diagnosis present

## 2023-11-18 DIAGNOSIS — R29898 Other symptoms and signs involving the musculoskeletal system: Secondary | ICD-10-CM | POA: Diagnosis not present

## 2023-11-18 DIAGNOSIS — R2689 Other abnormalities of gait and mobility: Secondary | ICD-10-CM | POA: Insufficient documentation

## 2023-11-18 DIAGNOSIS — M6281 Muscle weakness (generalized): Secondary | ICD-10-CM | POA: Insufficient documentation

## 2023-11-18 DIAGNOSIS — R293 Abnormal posture: Secondary | ICD-10-CM | POA: Diagnosis present

## 2023-11-18 NOTE — Therapy (Signed)
 OUTPATIENT PHYSICAL THERAPY LOWER EXTREMITY EVALUATION   Patient Name: Jonathan Savage MRN: [id number] DOB:1940/10/23, 83 y.o., male Today's Date: 11/18/2023  END OF SESSION:  PT End of Session - 11/18/23 0856     Visit Number 1    Date for Recertification  01/13/24    Authorization Type Medicare/BCBS    Progress Note Due on Visit 10    PT Start Time 0845    PT Stop Time 0925    PT Time Calculation (min) 40 min    Activity Tolerance Patient tolerated treatment well    Behavior During Therapy [facility] for tasks assessed/performed          Past Medical History:  Diagnosis Date   Carotid artery occlusion    Complication of anesthesia    pt states following last surgery he was awake for over 24 hours following surgery   Hypertension    Stroke [facility])    Past Surgical History:  Procedure Laterality Date   ENDARTERECTOMY Left 08/22/2019   Procedure: LEFT CAROTID ENDARTERECTOMY WITH BOVIE PATCH ANGIOPLASTY;  Surgeon: Serene Gaile ORN, MD;  Location: [facility] OR;  Service: Vascular;  Laterality: Left;   LAPAROSCOPIC NEPHRECTOMY Right    NECK SURGERY     PROSTATE SURGERY     Patient Active Problem List   Diagnosis Date Noted   Diverticulosis 11/10/2023   Postural dizziness with near syncope 10/19/2023   Chronic constipation 10/19/2023   Neuropathy of both feet 05/09/2023   Numbness and tingling of both lower extremities 11/04/2022   Acute cough 10/25/2022   Lab test positive for detection of COVID-19 virus 10/25/2022   Erectile dysfunction 07/21/2022   Elevated serum creatinine 05/10/2022   CKD stage 3a, GFR 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) 05/10/2022   Prediabetes 10/29/2020   Carotid stenosis, asymptomatic 08/22/2019   Claudication in peripheral vascular disease 06/08/2019   Essential hypertension 06/05/2019   Hyperlipidemia 06/05/2019   Carotid bruit 06/05/2019    PCP: Knute Thersia Bitters, FNP  REFERRING PROVIDER: Emiliano Leonce CROME, PA-C  REFERRING DIAG: [phone] (ICD-10-CM) -  Bilateral leg weakness  THERAPY DIAG:  Muscle weakness (generalized)  Balance problem  Cramp and spasm  Difficulty in walking, not elsewhere classified  Other lack of coordination  Abnormal posture  Cervicalgia  Neck muscle spasm  Rationale for Evaluation and Treatment: Rehabilitation  ONSET DATE: Summer 2025  SUBJECTIVE:   SUBJECTIVE STATEMENT: Patient goes to the gym twice a week.  He states that he has spells where he gets so weak, that he can't do anything.  States that he kept trying to go to the gym, but the past couple of weeks, he has not been able to go.  States that he has intermittent pain and sometimes it shoots up to 10/10.  PERTINENT HISTORY: Essential Hypertension, Claudication in the peripheral vascular disease, carotid stenosis, pt has one kidney, Hx of syncope, recent COVID on 10/25/2022   PAIN:  Are you having pain? Yes: NPRS scale: 0-10/10 Pain location: generally lower legs, but sometimes right side of back Pain description: feet feels like a burning sensation, sharp on the right side of back Aggravating factors: walking, certain movements Relieving factors: rest  PRECAUTIONS: None  RED FLAGS: None   WEIGHT BEARING RESTRICTIONS: No  FALLS:  Has patient fallen in last 6 months? No  LIVING ENVIRONMENT: Lives with: lives alone Lives in: House/apartment Stairs: one story Has following equipment at home: Grab bars  OCCUPATION: Retired  PLOF: Independent and Leisure: clocks, firearms/knives, cars, woodworking/construction  PATIENT GOALS:  To strengthen my legs and get better balance and help with the numbness some  NEXT MD VISIT: PCP February 08, 2024  OBJECTIVE:  Note: Objective measures were completed at Evaluation unless otherwise noted.  DIAGNOSTIC FINDINGS:  Thoracic Radiograph on 10/25/2022: IMPRESSION: No acute abnormality seen.  Lumbar Radiograph on 11/04/2022: IMPRESSION: Mild multilevel degenerative disc disease. No acute  abnormality seen.  PATIENT SURVEYS:  LEFS  Extreme difficulty/unable (0), Quite a bit of difficulty (1), Moderate difficulty (2), Little difficulty (3), No difficulty (4) Survey date:  11/18/23  Any of your usual work, housework or school activities 2  2. Usual hobbies, recreational or sporting activities 2  3. Getting into/out of the bath 4  4. Walking between rooms 4  5. Putting on socks/shoes 4  6. Squatting  1  7. Lifting an object, like a bag of groceries from the floor 4  8. Performing light activities around your home 3  9. Performing heavy activities around your home 1  10. Getting into/out of a car 3  11. Walking 2 blocks 2  12. Walking 1 mile 0  13. Going up/down 10 stairs (1 flight) 3  14. Standing for 1 hour 2  15.  sitting for 1 hour 3  16. Running on even ground 0  17. Running on uneven ground 0  18. Making sharp turns while running fast 0  19. Hopping  0  20. Rolling over in bed 4  Score total:  42/80 = 52.5%     COGNITION: Overall cognitive status: Within functional limits for tasks assessed     SENSATION: Pt reports burning sensation in the bottoms of his feet   MUSCLE LENGTH: Hamstrings: WFL  POSTURE: rounded shoulders  PALPATION: Muscle spasms noted in right paraspinals  LOWER EXTREMITY ROM:  WFL  LOWER EXTREMITY MMT:  Eval: Bilateral quad strength of 5/5 Bilateral hip strength of 4 to 4+/5  LOWER EXTREMITY SPECIAL TESTS:  Slump test:  negative bilat  FUNCTIONAL TESTS:  Eval: 5 times sit to stand: 13.32 sec MCTSIB: Condition 1: 30 sec, Condition 2: 30 sec, Condition 3: 30 sec, Condition 4: 5 sec, and Total Score: 95/120  GAIT: Distance walked: >500 ft Assistive device utilized: None Level of assistance: Complete Independence Comments: Patient reports that sometimes he gets feeling weaker                                                                                                                                TREATMENT DATE:   11/18/2023: Reviewed HEP Discussed dizziness and vestibular rehab    PATIENT EDUCATION:  Education details: Issued HEP Person educated: Patient Education method: Explanation, Demonstration, and Handouts Education comprehension: verbalized understanding and returned demonstration  HOME EXERCISE PROGRAM: Access Code: [email or web address] URL: [email or web address] Date: 11/18/2023 Prepared by: Jarrell Laming  Exercises - Standing March with Counter Support  - 1 x daily - 7 x weekly - 2 sets - 10 reps - Standing Hip  Abduction with Unilateral Counter Support  - 1 x daily - 7 x weekly - 2 sets - 10 reps - Standing Hip Extension with Unilateral Counter Support  - 1 x daily - 7 x weekly - 2 sets - 10 reps - Shoulder External Rotation and Scapular Retraction with Resistance  - 1 x daily - 7 x weekly - 2 sets - 10 reps - Standing Shoulder Horizontal Abduction with Resistance  - 1 x daily - 7 x weekly - 2 sets - 10 reps - Standing Shoulder Row with Anchored Resistance  - 1 x daily - 7 x weekly - 2 sets - 10 reps - Standing Anti-Rotation Press with Anchored Resistance  - 1 x daily - 7 x weekly - 2 sets - 10 reps - Shoulder extension with resistance - Neutral  - 1 x daily - 7 x weekly - 2 sets - 10 reps - Squat with Chair Touch  - 1 x daily - 7 x weekly - 2 sets - 10 reps - Seated Hamstring Stretch  - 1 x daily - 7 x weekly - 2 reps - 20 sec hold - Hip Flexor Stretch with Chair  - 1 x daily - 7 x weekly - 2 reps - 20 sec hold  ASSESSMENT:  CLINICAL IMPRESSION: Patient is an 83 y.o. male who was seen today for physical therapy evaluation and treatment for bilateral leg weakness. Patient reports that he has occasional dizziness, sometimes when getting out of bed.  Patient states that he feels like since he had Covid last year, he still occasionally gets weakness, but it has been worse in the past few weeks. Patient reports some difficulty with constipation, so provided him with bowel massage  information. Patient presents with slight hip muscle weakness, difficulty walking, dizziness, decreased balance, and difficulty performing functional tasks.  Patient would benefit from skilled PT to progress towards goal related activities.  OBJECTIVE IMPAIRMENTS: decreased balance, decreased coordination, difficulty walking, decreased strength, dizziness, increased muscle spasms, postural dysfunction, and pain.   ACTIVITY LIMITATIONS: carrying, lifting, bending, standing, and locomotion level  PARTICIPATION LIMITATIONS: cleaning and community activity  PERSONAL FACTORS: Fitness, Time since onset of injury/illness/exacerbation, and 3+ comorbidities: dizziness, carotid stenosis, Claudication are also affecting patient's functional outcome.   REHAB POTENTIAL: Good  CLINICAL DECISION MAKING: Evolving/moderate complexity  EVALUATION COMPLEXITY: Moderate   GOALS: Goals reviewed with patient? Yes  SHORT TERM GOALS: Target date: 12/11/2023 Patient will be independent with initial HEP.  Baseline: Goal status: INITIAL  2.  Patient will report at least a 25% improvement in symptoms since initial evaluation.  Baseline:  Goal status: INITIAL  3.  Patient will participate in 6 minute walk test to establish a baseline.  Baseline:  Goal status: INITIAL   LONG TERM GOALS: Target date: 01/13/2024  Patient will be independent with advanced HEP to allow him for self progression after discharge.  Baseline:  Goal status: INITIAL  2.  Patient will increase Lower Extremity Functional Scale to at least 65% to demonstrate improvements in functional mobility.  Baseline: 52.5% Goal status: INITIAL  3.  Patient will report ability to walk at least 20 minutes without increased pain to allow patient to ambulate community distances.  Baseline:  Goal status: INITIAL  4.  Patient will improve modified CTSIB to 120/120 to demonstrate decreased risk of falling.  Baseline:  Goal status: INITIAL  5.   Patient will increase UE/LE functional strength to [facility] to allow him to return to the gym.  Baseline:  Goal status: INITIAL     PLAN:  PT FREQUENCY: 1-2x/week  PT DURATION: 8 weeks  PLANNED INTERVENTIONS: 97164- PT Re-evaluation, 97750- Physical Performance Testing, 97110-Therapeutic exercises, 97530- Therapeutic activity, 97112- Neuromuscular re-education, 97535- Self Care, 02859- Manual therapy, [phone]- Gait training, [phone]- Canalith repositioning, [id number]- Aquatic Therapy, [phone]- Electrical stimulation (unattended), [phone]- Electrical stimulation (manual), [id number]- Vasopneumatic device, [id number]- Ultrasound, [id number]- Traction (mechanical), [id number]- Ionotophoresis 4mg /ml Dexamethasone , 79439 (1-2 muscles), [id number] (3+ muscles)- Dry Needling, Patient/Family education, Balance training, Stair training, Taping, Joint mobilization, Joint manipulation, Spinal manipulation, Spinal mobilization, Vestibular training, Cryotherapy, and Moist heat  PLAN FOR NEXT SESSION: Assess and progress HEP as indicated, strengthening, flexibility, manual/dry needling as indicated    Jarrell Laming, PT, DPT 11/18/23, 8:56 AM   [facility] [address], [address] [place], [place] [address] Phone # [phone] Fax [phone]

## 2023-11-18 NOTE — Patient Instructions (Signed)
 Jonathan Savage

## 2023-11-25 ENCOUNTER — Encounter: Payer: Self-pay | Admitting: Physical Therapy

## 2023-11-25 ENCOUNTER — Ambulatory Visit: Admitting: Physical Therapy

## 2023-11-25 DIAGNOSIS — R293 Abnormal posture: Secondary | ICD-10-CM

## 2023-11-25 DIAGNOSIS — M6281 Muscle weakness (generalized): Secondary | ICD-10-CM

## 2023-11-25 DIAGNOSIS — R262 Difficulty in walking, not elsewhere classified: Secondary | ICD-10-CM

## 2023-11-25 DIAGNOSIS — R2689 Other abnormalities of gait and mobility: Secondary | ICD-10-CM

## 2023-11-25 DIAGNOSIS — R252 Cramp and spasm: Secondary | ICD-10-CM

## 2023-11-25 DIAGNOSIS — R278 Other lack of coordination: Secondary | ICD-10-CM

## 2023-11-25 NOTE — Therapy (Signed)
 OUTPATIENT PHYSICAL THERAPY LOWER EXTREMITY PROGRESS NOTE   Patient Name: Jonathan Savage MRN: [id number] DOB:June 08, 1940, 83 y.o., male Today's Date: 11/25/2023  END OF SESSION:  PT End of Session - 11/25/23 1336     Visit Number 2    Date for Recertification  01/13/24    Authorization Type Medicare/BCBS    PT Start Time 1345    PT Stop Time 1423    PT Time Calculation (min) 38 min    Activity Tolerance Patient tolerated treatment well    Behavior During Therapy WFL for tasks assessed/performed           Past Medical History:  Diagnosis Date   Carotid artery occlusion    Complication of anesthesia    pt states following last surgery he was awake for over 24 hours following surgery   Hypertension    Stroke [facility])    Past Surgical History:  Procedure Laterality Date   ENDARTERECTOMY Left 08/22/2019   Procedure: LEFT CAROTID ENDARTERECTOMY WITH BOVIE PATCH ANGIOPLASTY;  Surgeon: Serene Gaile ORN, MD;  Location: [facility] OR;  Service: Vascular;  Laterality: Left;   LAPAROSCOPIC NEPHRECTOMY Right    NECK SURGERY     PROSTATE SURGERY     Patient Active Problem List   Diagnosis Date Noted   Diverticulosis 11/10/2023   Postural dizziness with near syncope 10/19/2023   Chronic constipation 10/19/2023   Neuropathy of both feet 05/09/2023   Numbness and tingling of both lower extremities 11/04/2022   Acute cough 10/25/2022   Lab test positive for detection of COVID-19 virus 10/25/2022   Erectile dysfunction 07/21/2022   Elevated serum creatinine 05/10/2022   CKD stage 3a, GFR 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) 05/10/2022   Prediabetes 10/29/2020   Carotid stenosis, asymptomatic 08/22/2019   Claudication in peripheral vascular disease 06/08/2019   Essential hypertension 06/05/2019   Hyperlipidemia 06/05/2019   Carotid bruit 06/05/2019    PCP: Knute Thersia Bitters, FNP  REFERRING PROVIDER: Emiliano Leonce CROME, PA-C  REFERRING DIAG: [phone] (ICD-10-CM) - Bilateral leg  weakness  THERAPY DIAG:  Muscle weakness (generalized)  Balance problem  Cramp and spasm  Difficulty in walking, not elsewhere classified  Other lack of coordination  Abnormal posture  Rationale for Evaluation and Treatment: Rehabilitation  ONSET DATE: Summer 2025  SUBJECTIVE:   SUBJECTIVE STATEMENT:  PERTINENT HISTORY: Essential Hypertension, Claudication in the peripheral vascular disease, carotid stenosis, pt has one kidney, Hx of syncope, recent COVID on 10/25/2022   PAIN:  Are you having pain? Yes: NPRS scale: 0-10/10 Pain location: generally lower legs, but sometimes right side of back Pain description: feet feels like a burning sensation, sharp on the right side of back Aggravating factors: walking, certain movements Relieving factors: rest  PRECAUTIONS: None  RED FLAGS: None   WEIGHT BEARING RESTRICTIONS: No  FALLS:  Has patient fallen in last 6 months? No  LIVING ENVIRONMENT: Lives with: lives alone Lives in: House/apartment Stairs: one story Has following equipment at home: Grab bars  OCCUPATION: Retired  PLOF: Independent and Leisure: clocks, firearms/knives, cars, woodworking/construction  PATIENT GOALS: To strengthen my legs and get better balance and help with the numbness some  NEXT MD VISIT: PCP February 08, 2024  OBJECTIVE:  Note: Objective measures were completed at Evaluation unless otherwise noted.  DIAGNOSTIC FINDINGS:  Thoracic Radiograph on 10/25/2022: IMPRESSION: No acute abnormality seen.  Lumbar Radiograph on 11/04/2022: IMPRESSION: Mild multilevel degenerative disc disease. No acute abnormality seen.  PATIENT SURVEYS:  LEFS  Extreme difficulty/unable (0), Quite a bit of  difficulty (1), Moderate difficulty (2), Little difficulty (3), No difficulty (4) Survey date:  11/18/23  Any of your usual work, housework or school activities 2  2. Usual hobbies, recreational or sporting activities 2  3. Getting into/out of the  bath 4  4. Walking between rooms 4  5. Putting on socks/shoes 4  6. Squatting  1  7. Lifting an object, like a bag of groceries from the floor 4  8. Performing light activities around your home 3  9. Performing heavy activities around your home 1  10. Getting into/out of a car 3  11. Walking 2 blocks 2  12. Walking 1 mile 0  13. Going up/down 10 stairs (1 flight) 3  14. Standing for 1 hour 2  15.  sitting for 1 hour 3  16. Running on even ground 0  17. Running on uneven ground 0  18. Making sharp turns while running fast 0  19. Hopping  0  20. Rolling over in bed 4  Score total:  42/80 = 52.5%     COGNITION: Overall cognitive status: Within functional limits for tasks assessed     SENSATION: Pt reports burning sensation in the bottoms of his feet   MUSCLE LENGTH: Hamstrings: WFL  POSTURE: rounded shoulders  PALPATION: Muscle spasms noted in right paraspinals  LOWER EXTREMITY ROM:  WFL  LOWER EXTREMITY MMT:  Eval: Bilateral quad strength of 5/5 Bilateral hip strength of 4 to 4+/5  LOWER EXTREMITY SPECIAL TESTS:  Slump test:  negative bilat  FUNCTIONAL TESTS:  Eval: 5 times sit to stand: 13.32 sec MCTSIB: Condition 1: 30 sec, Condition 2: 30 sec, Condition 3: 30 sec, Condition 4: 5 sec, and Total Score: 95/120  GAIT: Distance walked: >500 ft Assistive device utilized: None Level of assistance: Complete Independence Comments: Patient reports that sometimes he gets feeling weaker                                                                                                                                TREATMENT DATE:   11/25/23:Pt arrives for aquatic physical therapy. Treatment took place in 3.5-5.5 feet of water. Water temperature was 90 degrees F. Pt entered the pool via stairs independently. Pt performed seated LE AROM exercises 20x in all planes, with concurrent discussion about current status and verbal education on water principles we will use. Pt  verbally understood.  -75% depth water walk holding rainbow floats for balance in each direction 4x each. - high knee marching 2 length holding rainbow floats -heel raises 10x -hip 3 ways 10x Bil holding on for balance -horseback bicycle in the corner 2 min  11/18/2023: Reviewed HEP Discussed dizziness and vestibular rehab    PATIENT EDUCATION:  Education details: Issued HEP Person educated: Patient Education method: Explanation, Demonstration, and Handouts Education comprehension: verbalized understanding and returned demonstration  HOME EXERCISE PROGRAM: Access Code: [email or web address] URL: [email or web address] Date: 11/18/2023 Prepared by: Jarrell Laming  Exercises -  Standing March with Counter Support  - 1 x daily - 7 x weekly - 2 sets - 10 reps - Standing Hip Abduction with Unilateral Counter Support  - 1 x daily - 7 x weekly - 2 sets - 10 reps - Standing Hip Extension with Unilateral Counter Support  - 1 x daily - 7 x weekly - 2 sets - 10 reps - Shoulder External Rotation and Scapular Retraction with Resistance  - 1 x daily - 7 x weekly - 2 sets - 10 reps - Standing Shoulder Horizontal Abduction with Resistance  - 1 x daily - 7 x weekly - 2 sets - 10 reps - Standing Shoulder Row with Anchored Resistance  - 1 x daily - 7 x weekly - 2 sets - 10 reps - Standing Anti-Rotation Press with Anchored Resistance  - 1 x daily - 7 x weekly - 2 sets - 10 reps - Shoulder extension with resistance - Neutral  - 1 x daily - 7 x weekly - 2 sets - 10 reps - Squat with Chair Touch  - 1 x daily - 7 x weekly - 2 sets - 10 reps - Seated Hamstring Stretch  - 1 x daily - 7 x weekly - 2 reps - 20 sec hold - Hip Flexor Stretch with Chair  - 1 x daily - 7 x weekly - 2 reps - 20 sec hold  ASSESSMENT:  CLINICAL IMPRESSION: Pt arrives for first aquatic treatment with some achey knees. He requires some form of assistance for his balance today with all exercises. He reported no pain when exercising  in the water but did notice mild fatigue set in around the 30 minmark.  OBJECTIVE IMPAIRMENTS: decreased balance, decreased coordination, difficulty walking, decreased strength, dizziness, increased muscle spasms, postural dysfunction, and pain.   ACTIVITY LIMITATIONS: carrying, lifting, bending, standing, and locomotion level  PARTICIPATION LIMITATIONS: cleaning and community activity  PERSONAL FACTORS: Fitness, Time since onset of injury/illness/exacerbation, and 3+ comorbidities: dizziness, carotid stenosis, Claudication are also affecting patient's functional outcome.   REHAB POTENTIAL: Good  CLINICAL DECISION MAKING: Evolving/moderate complexity  EVALUATION COMPLEXITY: Moderate   GOALS: Goals reviewed with patient? Yes  SHORT TERM GOALS: Target date: 12/11/2023 Patient will be independent with initial HEP.  Baseline: Goal status: INITIAL  2.  Patient will report at least a 25% improvement in symptoms since initial evaluation.  Baseline:  Goal status: INITIAL  3.  Patient will participate in 6 minute walk test to establish a baseline.  Baseline:  Goal status: INITIAL   LONG TERM GOALS: Target date: 01/13/2024  Patient will be independent with advanced HEP to allow him for self progression after discharge.  Baseline:  Goal status: INITIAL  2.  Patient will increase Lower Extremity Functional Scale to at least 65% to demonstrate improvements in functional mobility.  Baseline: 52.5% Goal status: INITIAL  3.  Patient will report ability to walk at least 20 minutes without increased pain to allow patient to ambulate community distances.  Baseline:  Goal status: INITIAL  4.  Patient will improve modified CTSIB to 120/120 to demonstrate decreased risk of falling.  Baseline:  Goal status: INITIAL  5.  Patient will increase UE/LE functional strength to [facility] to allow him to return to the gym.  Baseline:  Goal status: INITIAL     PLAN:  PT FREQUENCY: 1-2x/week  PT  DURATION: 8 weeks  PLANNED INTERVENTIONS: 97164- PT Re-evaluation, 97750- Physical Performance Testing, 97110-Therapeutic exercises, 97530- Therapeutic activity, [id number]- Neuromuscular re-education, 97535- Self  Care, 02859- Manual therapy, [phone]- Gait training, [phone]- Canalith repositioning, [phone]- Aquatic Therapy, [phone]- Electrical stimulation (unattended), [phone]- Electrical stimulation (manual), [id number]- Vasopneumatic device, [id number]- Ultrasound, [id number]- Traction (mechanical), [id number]- Ionotophoresis 4mg /ml Dexamethasone , 79439 (1-2 muscles), 20561 (3+ muscles)- Dry Needling, Patient/Family education, Balance training, Stair training, Taping, Joint mobilization, Joint manipulation, Spinal manipulation, Spinal mobilization, Vestibular training, Cryotherapy, and Moist heat  PLAN FOR NEXT SESSION: Assess and progress HEP as indicated, strengthening, flexibility, manual/dry needling as indicated   Delon Darner, PTA 11/25/23 2:22 PM     [facility] [address] [place], [place] [address] Phone # [phone] Fax [phone]

## 2023-12-06 ENCOUNTER — Encounter: Payer: Self-pay | Admitting: Rehabilitative and Restorative Service Providers"

## 2023-12-06 ENCOUNTER — Ambulatory Visit: Attending: Student | Admitting: Rehabilitative and Restorative Service Providers"

## 2023-12-06 DIAGNOSIS — M542 Cervicalgia: Secondary | ICD-10-CM | POA: Insufficient documentation

## 2023-12-06 DIAGNOSIS — M6281 Muscle weakness (generalized): Secondary | ICD-10-CM | POA: Insufficient documentation

## 2023-12-06 DIAGNOSIS — M62838 Other muscle spasm: Secondary | ICD-10-CM | POA: Diagnosis present

## 2023-12-06 DIAGNOSIS — R293 Abnormal posture: Secondary | ICD-10-CM | POA: Diagnosis present

## 2023-12-06 DIAGNOSIS — R2689 Other abnormalities of gait and mobility: Secondary | ICD-10-CM | POA: Insufficient documentation

## 2023-12-06 DIAGNOSIS — R252 Cramp and spasm: Secondary | ICD-10-CM | POA: Diagnosis present

## 2023-12-06 DIAGNOSIS — R262 Difficulty in walking, not elsewhere classified: Secondary | ICD-10-CM | POA: Insufficient documentation

## 2023-12-06 DIAGNOSIS — R278 Other lack of coordination: Secondary | ICD-10-CM | POA: Diagnosis present

## 2023-12-06 NOTE — Therapy (Signed)
 OUTPATIENT PHYSICAL THERAPY TREATMENT NOTE   Patient Name: Jonathan Savage MRN: [id number] DOB:04-Oct-1940, 83 y.o., male Today's Date: 12/06/2023  END OF SESSION:  PT End of Session - 12/06/23 1231     Visit Number 3    Date for Recertification  01/13/24    Authorization Type Medicare/BCBS    Progress Note Due on Visit 10    PT Start Time 1230    PT Stop Time 1310    PT Time Calculation (min) 40 min    Activity Tolerance Patient tolerated treatment well    Behavior During Therapy [facility] for tasks assessed/performed           Past Medical History:  Diagnosis Date   Carotid artery occlusion    Complication of anesthesia    pt states following last surgery he was awake for over 24 hours following surgery   Hypertension    Stroke [facility])    Past Surgical History:  Procedure Laterality Date   ENDARTERECTOMY Left 08/22/2019   Procedure: LEFT CAROTID ENDARTERECTOMY WITH BOVIE PATCH ANGIOPLASTY;  Surgeon: Serene Gaile ORN, MD;  Location: [facility] OR;  Service: Vascular;  Laterality: Left;   LAPAROSCOPIC NEPHRECTOMY Right    NECK SURGERY     PROSTATE SURGERY     Patient Active Problem List   Diagnosis Date Noted   Diverticulosis 11/10/2023   Postural dizziness with near syncope 10/19/2023   Chronic constipation 10/19/2023   Neuropathy of both feet 05/09/2023   Numbness and tingling of both lower extremities 11/04/2022   Acute cough 10/25/2022   Lab test positive for detection of COVID-19 virus 10/25/2022   Erectile dysfunction 07/21/2022   Elevated serum creatinine 05/10/2022   CKD stage 3a, GFR 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) 05/10/2022   Prediabetes 10/29/2020   Carotid stenosis, asymptomatic 08/22/2019   Claudication in peripheral vascular disease 06/08/2019   Essential hypertension 06/05/2019   Hyperlipidemia 06/05/2019   Carotid bruit 06/05/2019    PCP: Knute Thersia Bitters, FNP  REFERRING PROVIDER: Emiliano Leonce CROME, PA-C  REFERRING DIAG: [phone] (ICD-10-CM) - Bilateral  leg weakness  THERAPY DIAG:  Muscle weakness (generalized)  Balance problem  Cramp and spasm  Difficulty in walking, not elsewhere classified  Other lack of coordination  Abnormal posture  Cervicalgia  Neck muscle spasm  Rationale for Evaluation and Treatment: Rehabilitation  ONSET DATE: Summer 2025  SUBJECTIVE:   SUBJECTIVE STATEMENT:  Patient reports that he is feeling about the same.  States that he enjoyed the pool and worked him.  States that he has dizziness, especially when he rolls out of bed.  PERTINENT HISTORY: Essential Hypertension, Claudication in the peripheral vascular disease, carotid stenosis, pt has one kidney, Hx of syncope, recent COVID on 10/25/2022   PAIN:  Are you having pain? Yes: NPRS scale: 5/10 Pain location: generally lower legs, but sometimes right side of back Pain description: feet feels like a burning sensation, sharp on the right side of back Aggravating factors: walking, certain movements Relieving factors: rest  PRECAUTIONS: None  RED FLAGS: None   WEIGHT BEARING RESTRICTIONS: No  FALLS:  Has patient fallen in last 6 months? No  LIVING ENVIRONMENT: Lives with: lives alone Lives in: House/apartment Stairs: one story Has following equipment at home: Grab bars  OCCUPATION: Retired  PLOF: Independent and Leisure: clocks, firearms/knives, cars, woodworking/construction  PATIENT GOALS: To strengthen my legs and get better balance and help with the numbness some  NEXT MD VISIT: PCP February 08, 2024  OBJECTIVE:  Note: Objective measures were completed  at Evaluation unless otherwise noted.  DIAGNOSTIC FINDINGS:  Thoracic Radiograph on 10/25/2022: IMPRESSION: No acute abnormality seen.  Lumbar Radiograph on 11/04/2022: IMPRESSION: Mild multilevel degenerative disc disease. No acute abnormality seen.  PATIENT SURVEYS:  LEFS  Extreme difficulty/unable (0), Quite a bit of difficulty (1), Moderate difficulty (2),  Little difficulty (3), No difficulty (4) Survey date:  11/18/23  Any of your usual work, housework or school activities 2  2. Usual hobbies, recreational or sporting activities 2  3. Getting into/out of the bath 4  4. Walking between rooms 4  5. Putting on socks/shoes 4  6. Squatting  1  7. Lifting an object, like a bag of groceries from the floor 4  8. Performing light activities around your home 3  9. Performing heavy activities around your home 1  10. Getting into/out of a car 3  11. Walking 2 blocks 2  12. Walking 1 mile 0  13. Going up/down 10 stairs (1 flight) 3  14. Standing for 1 hour 2  15.  sitting for 1 hour 3  16. Running on even ground 0  17. Running on uneven ground 0  18. Making sharp turns while running fast 0  19. Hopping  0  20. Rolling over in bed 4  Score total:  42/80 = 52.5%     COGNITION: Overall cognitive status: Within functional limits for tasks assessed     SENSATION: Pt reports burning sensation in the bottoms of his feet   MUSCLE LENGTH: Hamstrings: WFL  POSTURE: rounded shoulders  PALPATION: Muscle spasms noted in right paraspinals  LOWER EXTREMITY ROM:  WFL  LOWER EXTREMITY MMT:  Eval: Bilateral quad strength of 5/5 Bilateral hip strength of 4 to 4+/5  LOWER EXTREMITY SPECIAL TESTS:  Slump test:  negative bilat  FUNCTIONAL TESTS:  Eval: 5 times sit to stand: 13.32 sec MCTSIB: Condition 1: 30 sec, Condition 2: 30 sec, Condition 3: 30 sec, Condition 4: 5 sec, and Total Score: 95/120  GAIT: Distance walked: >500 ft Assistive device utilized: None Level of assistance: Complete Independence Comments: Patient reports that sometimes he gets feeling weaker  VESTIBULAR ASSESSMENT:  12/06/2023: Smooth pursuit:  saccades noted when tracking from right back to mid-line Saccades:  WFL Gaze stabilization is [facility] Head Impulse Test:  slight nystamgus noted, but difficult to assess secondary to patient guarding movement Dix Hallpike:   negative to the left, positive to the right                                                                                                                               TREATMENT DATE:   12/06/2023: Nustep level 6 x5 min with PT present to discuss status Vestibular assessment (see above) Dix Hallpike positive on the right.  Proceeded with canalith repositioning x2 with Epley maneuver 6 min walk test:  979 ft Seated shoulder horizontal abduction with red tband 2x10 Standing rows with red tband 2x10 Standing shoulder  extension with red tband 2x10   11/25/23: Pt arrives for aquatic physical therapy. Treatment took place in 3.5-5.5 feet of water. Water temperature was 90 degrees F. Pt entered the pool via stairs independently. Pt performed seated LE AROM exercises 20x in all planes, with concurrent discussion about current status and verbal education on water principles we will use. Pt verbally understood.  -75% depth water walk holding rainbow floats for balance in each direction 4x each. - high knee marching 2 length holding rainbow floats -heel raises 10x -hip 3 ways 10x Bil holding on for balance -horseback bicycle in the corner 2 min  11/18/2023: Reviewed HEP Discussed dizziness and vestibular rehab    PATIENT EDUCATION:  Education details: Issued HEP Person educated: Patient Education method: Explanation, Demonstration, and Handouts Education comprehension: verbalized understanding and returned demonstration  HOME EXERCISE PROGRAM: Access Code: [email or web address] URL: [email or web address] Date: 11/18/2023 Prepared by: Jarrell Laming  Exercises - Standing March with Counter Support  - 1 x daily - 7 x weekly - 2 sets - 10 reps - Standing Hip Abduction with Unilateral Counter Support  - 1 x daily - 7 x weekly - 2 sets - 10 reps - Standing Hip Extension with Unilateral Counter Support  - 1 x daily - 7 x weekly - 2 sets - 10 reps - Shoulder External Rotation and Scapular  Retraction with Resistance  - 1 x daily - 7 x weekly - 2 sets - 10 reps - Standing Shoulder Horizontal Abduction with Resistance  - 1 x daily - 7 x weekly - 2 sets - 10 reps - Standing Shoulder Row with Anchored Resistance  - 1 x daily - 7 x weekly - 2 sets - 10 reps - Standing Anti-Rotation Press with Anchored Resistance  - 1 x daily - 7 x weekly - 2 sets - 10 reps - Shoulder extension with resistance - Neutral  - 1 x daily - 7 x weekly - 2 sets - 10 reps - Squat with Chair Touch  - 1 x daily - 7 x weekly - 2 sets - 10 reps - Seated Hamstring Stretch  - 1 x daily - 7 x weekly - 2 reps - 20 sec hold - Hip Flexor Stretch with Chair  - 1 x daily - 7 x weekly - 2 reps - 20 sec hold  ASSESSMENT:  CLINICAL IMPRESSION:   Mr Stacks presents to skilled PT reporting that he did okay with aquatic PT, but did have some fatigue.  Patient does admit that he has not been performing his HEP.  Patient states that he has continued to have some dizziness, especially when getting out of bed.  Patient underwent vestibular assessment today and he did have some difficulty with smooth pursuit, especially when going from the right to midline.  Patient with positive Trenda Craze on the right and proceeded to perform canalith repositioning with the Epley maneuver to treat.  Patient able to participate in a 6 min walk to establish baseline measurement.  Patient educated on the importance of performing his HEP to assist with his LE weakness.  Patient continues to require skilled PT to progress towards goal related activities.  OBJECTIVE IMPAIRMENTS: decreased balance, decreased coordination, difficulty walking, decreased strength, dizziness, increased muscle spasms, postural dysfunction, and pain.   ACTIVITY LIMITATIONS: carrying, lifting, bending, standing, and locomotion level  PARTICIPATION LIMITATIONS: cleaning and community activity  PERSONAL FACTORS: Fitness, Time since onset of injury/illness/exacerbation, and 3+  comorbidities: dizziness, carotid stenosis, Claudication are also affecting patient's  functional outcome.   REHAB POTENTIAL: Good  CLINICAL DECISION MAKING: Evolving/moderate complexity  EVALUATION COMPLEXITY: Moderate   GOALS: Goals reviewed with patient? Yes  SHORT TERM GOALS: Target date: 12/11/2023 Patient will be independent with initial HEP.  Baseline: Goal status: Ongoing  2.  Patient will report at least a 25% improvement in symptoms since initial evaluation.  Baseline:  Goal status: Ongoing  3.  Patient will participate in 6 minute walk test to establish a baseline.  Baseline:  Goal status: Met on 12/06/23 (979 ft)   LONG TERM GOALS: Target date: 01/13/2024  Patient will be independent with advanced HEP to allow him for self progression after discharge.  Baseline:  Goal status: INITIAL  2.  Patient will increase Lower Extremity Functional Scale to at least 65% to demonstrate improvements in functional mobility.  Baseline: 52.5% Goal status: INITIAL  3.  Patient will report ability to walk at least 20 minutes without increased pain to allow patient to ambulate community distances.  Baseline:  Goal status: INITIAL  4.  Patient will improve modified CTSIB to 120/120 to demonstrate decreased risk of falling.  Baseline:  Goal status: INITIAL  5.  Patient will increase UE/LE functional strength to [facility] to allow him to return to the gym.  Baseline:  Goal status: INITIAL     PLAN:  PT FREQUENCY: 1-2x/week  PT DURATION: 8 weeks  PLANNED INTERVENTIONS: 97164- PT Re-evaluation, 97750- Physical Performance Testing, 97110-Therapeutic exercises, 97530- Therapeutic activity, 97112- Neuromuscular re-education, 97535- Self Care, 02859- Manual therapy, [phone]- Gait training, [phone]- Canalith repositioning, [id number]- Aquatic Therapy, [phone]- Electrical stimulation (unattended), [phone]- Electrical stimulation (manual), [id number]- Vasopneumatic device, [id number]- Ultrasound, [id number]- Traction  (mechanical), [id number]- Ionotophoresis 4mg /ml Dexamethasone , 79439 (1-2 muscles), 20561 (3+ muscles)- Dry Needling, Patient/Family education, Balance training, Stair training, Taping, Joint mobilization, Joint manipulation, Spinal manipulation, Spinal mobilization, Vestibular training, Cryotherapy, and Moist heat  PLAN FOR NEXT SESSION: Assess and progress HEP as indicated, strengthening, flexibility, manual/dry needling as indicated, ask about dizziness and if it is improved    Jarrell Laming, PT, DPT 12/06/23, 1:20 PM  [facility] [address], [address] [place], [place] [address] Phone # [phone] Fax [phone]

## 2023-12-14 ENCOUNTER — Encounter: Payer: Self-pay | Admitting: Physical Therapy

## 2023-12-14 ENCOUNTER — Ambulatory Visit: Admitting: Nurse Practitioner

## 2023-12-14 ENCOUNTER — Encounter: Payer: Self-pay | Admitting: Nurse Practitioner

## 2023-12-14 ENCOUNTER — Telehealth: Payer: Self-pay

## 2023-12-14 ENCOUNTER — Other Ambulatory Visit: Payer: Self-pay | Admitting: Cardiovascular Disease

## 2023-12-14 ENCOUNTER — Other Ambulatory Visit (INDEPENDENT_AMBULATORY_CARE_PROVIDER_SITE_OTHER)

## 2023-12-14 ENCOUNTER — Ambulatory Visit: Admitting: Physical Therapy

## 2023-12-14 VITALS — BP 140/68 | HR 64 | Ht 66.0 in | Wt 149.1 lb

## 2023-12-14 DIAGNOSIS — R262 Difficulty in walking, not elsewhere classified: Secondary | ICD-10-CM

## 2023-12-14 DIAGNOSIS — K6289 Other specified diseases of anus and rectum: Secondary | ICD-10-CM | POA: Diagnosis not present

## 2023-12-14 DIAGNOSIS — K5909 Other constipation: Secondary | ICD-10-CM | POA: Diagnosis not present

## 2023-12-14 DIAGNOSIS — K5900 Constipation, unspecified: Secondary | ICD-10-CM | POA: Diagnosis not present

## 2023-12-14 DIAGNOSIS — Z860100 Personal history of colon polyps, unspecified: Secondary | ICD-10-CM | POA: Diagnosis not present

## 2023-12-14 DIAGNOSIS — M6281 Muscle weakness (generalized): Secondary | ICD-10-CM

## 2023-12-14 DIAGNOSIS — R278 Other lack of coordination: Secondary | ICD-10-CM

## 2023-12-14 DIAGNOSIS — K5730 Diverticulosis of large intestine without perforation or abscess without bleeding: Secondary | ICD-10-CM | POA: Diagnosis not present

## 2023-12-14 DIAGNOSIS — R293 Abnormal posture: Secondary | ICD-10-CM

## 2023-12-14 DIAGNOSIS — R252 Cramp and spasm: Secondary | ICD-10-CM

## 2023-12-14 DIAGNOSIS — E782 Mixed hyperlipidemia: Secondary | ICD-10-CM

## 2023-12-14 DIAGNOSIS — R2689 Other abnormalities of gait and mobility: Secondary | ICD-10-CM

## 2023-12-14 LAB — COMPREHENSIVE METABOLIC PANEL WITH GFR
ALT: 16 U/L (ref 0–53)
AST: 22 U/L (ref 0–37)
Albumin: 4.6 g/dL (ref 3.5–5.2)
Alkaline Phosphatase: 66 U/L (ref 39–117)
BUN: 20 mg/dL (ref 6–23)
CO2: 31 meq/L (ref 19–32)
Calcium: 10 mg/dL (ref 8.4–10.5)
Chloride: 101 meq/L (ref 96–112)
Creatinine, Ser: 1.25 mg/dL (ref 0.40–1.50)
GFR: 52.92 mL/min — ABNORMAL LOW (ref >60.00)
Glucose, Bld: 92 mg/dL (ref 70–99)
Potassium: 4.3 meq/L (ref 3.5–5.1)
Sodium: 139 meq/L (ref 135–145)
Total Bilirubin: 0.4 mg/dL (ref 0.2–1.2)
Total Protein: 7.8 g/dL (ref 6.0–8.3)

## 2023-12-14 LAB — TSH: TSH: 1.29 u[IU]/mL (ref 0.35–5.50)

## 2023-12-14 MED ORDER — NA SULFATE-K SULFATE-MG SULF 17.5-3.13-1.6 GM/177ML PO SOLN: 1.0000 | Freq: Once | ORAL | 0 refills | Status: AC

## 2023-12-14 NOTE — Patient Instructions (Addendum)
 _______________________________________________________  If your blood pressure at your visit was 140/90 or greater, please contact your primary care physician to follow up on this.  _______________________________________________________  If you are age 84 or older, your body mass index should be between 23-30. Your Body mass index is 24.07 kg/m. If this is out of the aforementioned range listed, please consider follow up with your Primary Care Provider.  If you are age 99 or younger, your body mass index should be between 19-25. Your Body mass index is 24.07 kg/m. If this is out of the aformentioned range listed, please consider follow up with your Primary Care Provider.   ________________________________________________________  The [facility] providers would like to encourage you to use MYCHART to communicate with providers for non-urgent requests or questions.  Due to long hold times on the telephone, sending your provider a message by [facility] may be a faster and more efficient way to get a response.  Please allow 48 business hours for a response.  Please remember that this is for non-urgent requests.  _______________________________________________________  [facility] is using a team-based approach to care.  Your team is made up of your doctor and two to three APPS. Our APPS (Nurse Practitioners and Physician Assistants) work with your physician to ensure care continuity for you. They are fully qualified to address your health concerns and develop a treatment plan. They communicate directly with your gastroenterologist to care for you. Seeing the Advanced Practice Practitioners on your physician's team can help you by facilitating care more promptly, often allowing for earlier appointments, access to diagnostic testing, procedures, and other specialty referrals.   Your provider has requested that you go to the basement level for lab work before leaving today. Press B on the  elevator. The lab is located at the first door on the left as you exit the elevator.  Please purchase the following medications over the counter and take as directed: Miralax 17g 2 times a day Benefiber 1 tablespoon daily   Please contact our office at [phone] Mid January to see about if we have received your records or if you can remember GI doctor your saw in [name] .  You have been scheduled for a colonoscopy. Please follow written instructions given to you at your visit today.   If you use inhalers (even only as needed), please bring them with you on the day of your procedure.  DO NOT TAKE 7 DAYS PRIOR TO TEST- Trulicity (dulaglutide) Ozempic, Wegovy (semaglutide) Mounjaro, Zepbound (tirzepatide) Bydureon Bcise (exanatide extended release)  DO NOT TAKE 1 DAY PRIOR TO YOUR TEST Rybelsus (semaglutide) Adlyxin (lixisenatide) Victoza (liraglutide) Byetta (exanatide) ___________________________________________________________________________    Thank you for trusting me with your gastrointestinal care!   Elida Shawl, CRNP

## 2023-12-14 NOTE — Therapy (Signed)
 OUTPATIENT PHYSICAL THERAPY TREATMENT NOTE   Patient Name: Jonathan Savage MRN: [id number] DOB:October 19, 1940, 83 y.o., male Today's Date: 12/14/2023  END OF SESSION:  PT End of Session - 12/14/23 0803     Visit Number 4    Date for Recertification  01/13/24    Authorization Type Medicare/BCBS    Progress Note Due on Visit 10    PT Start Time 0800    PT Stop Time 0840    PT Time Calculation (min) 40 min    Activity Tolerance Patient tolerated treatment well    Behavior During Therapy [facility] for tasks assessed/performed           Past Medical History:  Diagnosis Date   Carotid artery occlusion    Complication of anesthesia    pt states following last surgery he was awake for over 24 hours following surgery   Hypertension    Stroke [facility])    Past Surgical History:  Procedure Laterality Date   ENDARTERECTOMY Left 08/22/2019   Procedure: LEFT CAROTID ENDARTERECTOMY WITH BOVIE PATCH ANGIOPLASTY;  Surgeon: Serene Gaile ORN, MD;  Location: [facility] OR;  Service: Vascular;  Laterality: Left;   LAPAROSCOPIC NEPHRECTOMY Right    NECK SURGERY     PROSTATE SURGERY     Patient Active Problem List   Diagnosis Date Noted   Diverticulosis 11/10/2023   Postural dizziness with near syncope 10/19/2023   Chronic constipation 10/19/2023   Neuropathy of both feet 05/09/2023   Numbness and tingling of both lower extremities 11/04/2022   Acute cough 10/25/2022   Lab test positive for detection of COVID-19 virus 10/25/2022   Erectile dysfunction 07/21/2022   Elevated serum creatinine 05/10/2022   CKD stage 3a, GFR 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) 05/10/2022   Prediabetes 10/29/2020   Carotid stenosis, asymptomatic 08/22/2019   Claudication in peripheral vascular disease 06/08/2019   Essential hypertension 06/05/2019   Hyperlipidemia 06/05/2019   Carotid bruit 06/05/2019    PCP: Knute Thersia Bitters, FNP  REFERRING PROVIDER: Emiliano Leonce CROME, PA-C  REFERRING DIAG: [phone] (ICD-10-CM) - Bilateral  leg weakness  THERAPY DIAG:  Muscle weakness (generalized)  Balance problem  Cramp and spasm  Difficulty in walking, not elsewhere classified  Other lack of coordination  Abnormal posture  Rationale for Evaluation and Treatment: Rehabilitation  ONSET DATE: Summer 2025  SUBJECTIVE:   SUBJECTIVE STATEMENT: Boy I am not good at 8 am in the morning. Dizziness remains but those exercises really help me.  PERTINENT HISTORY: Essential Hypertension, Claudication in the peripheral vascular disease, carotid stenosis, pt has one kidney, Hx of syncope, recent COVID on 10/25/2022   PAIN:  Are you having pain? Yes: NPRS scale: 5/10 Pain location: generally lower legs, but sometimes right side of back Pain description: feet feels like a burning sensation, sharp on the right side of back Aggravating factors: walking, certain movements Relieving factors: rest  PRECAUTIONS: None  RED FLAGS: None   WEIGHT BEARING RESTRICTIONS: No  FALLS:  Has patient fallen in last 6 months? No  LIVING ENVIRONMENT: Lives with: lives alone Lives in: House/apartment Stairs: one story Has following equipment at home: Grab bars  OCCUPATION: Retired  PLOF: Independent and Leisure: clocks, firearms/knives, cars, woodworking/construction  PATIENT GOALS: To strengthen my legs and get better balance and help with the numbness some  NEXT MD VISIT: PCP February 08, 2024  OBJECTIVE:  Note: Objective measures were completed at Evaluation unless otherwise noted.  DIAGNOSTIC FINDINGS:  Thoracic Radiograph on 10/25/2022: IMPRESSION: No acute abnormality seen.  Lumbar  Radiograph on 11/04/2022: IMPRESSION: Mild multilevel degenerative disc disease. No acute abnormality seen.  PATIENT SURVEYS:  LEFS  Extreme difficulty/unable (0), Quite a bit of difficulty (1), Moderate difficulty (2), Little difficulty (3), No difficulty (4) Survey date:  11/18/23  Any of your usual work, housework or school  activities 2  2. Usual hobbies, recreational or sporting activities 2  3. Getting into/out of the bath 4  4. Walking between rooms 4  5. Putting on socks/shoes 4  6. Squatting  1  7. Lifting an object, like a bag of groceries from the floor 4  8. Performing light activities around your home 3  9. Performing heavy activities around your home 1  10. Getting into/out of a car 3  11. Walking 2 blocks 2  12. Walking 1 mile 0  13. Going up/down 10 stairs (1 flight) 3  14. Standing for 1 hour 2  15.  sitting for 1 hour 3  16. Running on even ground 0  17. Running on uneven ground 0  18. Making sharp turns while running fast 0  19. Hopping  0  20. Rolling over in bed 4  Score total:  42/80 = 52.5%     COGNITION: Overall cognitive status: Within functional limits for tasks assessed     SENSATION: Pt reports burning sensation in the bottoms of his feet   MUSCLE LENGTH: Hamstrings: WFL  POSTURE: rounded shoulders  PALPATION: Muscle spasms noted in right paraspinals  LOWER EXTREMITY ROM:  WFL  LOWER EXTREMITY MMT:  Eval: Bilateral quad strength of 5/5 Bilateral hip strength of 4 to 4+/5  LOWER EXTREMITY SPECIAL TESTS:  Slump test:  negative bilat  FUNCTIONAL TESTS:  Eval: 5 times sit to stand: 13.32 sec MCTSIB: Condition 1: 30 sec, Condition 2: 30 sec, Condition 3: 30 sec, Condition 4: 5 sec, and Total Score: 95/120  GAIT: Distance walked: >500 ft Assistive device utilized: None Level of assistance: Complete Independence Comments: Patient reports that sometimes he gets feeling weaker  VESTIBULAR ASSESSMENT:  12/06/2023: Smooth pursuit:  saccades noted when tracking from right back to mid-line Saccades:  WFL Gaze stabilization is [facility] Head Impulse Test:  slight nystamgus noted, but difficult to assess secondary to patient guarding movement Dix Hallpike:  negative to the left, positive to the right                                                                                                                                TREATMENT DATE:   12/14/23:Pt arrives for aquatic physical therapy. Treatment took place in 3.5-5.5 feet of water. Water temperature was 90 degrees F. Pt entered the pool via stairs independently. Pt performed seated LE AROM exercises 20x in all planes, with concurrent discussion about current status and verbal education on water principles we will use. Pt verbally understood.  -75% depth water walk holding rainbow floats for balance in each direction 6x each. - high knee marching  4 length holding rainbow floats -heel raises 10x2 -hip 3 ways 15x Bil holding on for balance -Tandem stance holding rainbow weights for balance assist 30 sec 2x each side -horseback bicycle in the corner 3 min, then Xcountry legs 1 min   12/06/2023: Nustep level 6 x5 min with PT present to discuss status Vestibular assessment (see above) Dix Hallpike positive on the right.  Proceeded with canalith repositioning x2 with Epley maneuver 6 min walk test:  979 ft Seated shoulder horizontal abduction with red tband 2x10 Standing rows with red tband 2x10 Standing shoulder extension with red tband 2x10   11/25/23: Pt arrives for aquatic physical therapy. Treatment took place in 3.5-5.5 feet of water. Water temperature was 90 degrees F. Pt entered the pool via stairs independently. Pt performed seated LE AROM exercises 20x in all planes, with concurrent discussion about current status and verbal education on water principles we will use. Pt verbally understood.  -75% depth water walk holding rainbow floats for balance in each direction 4x each. - high knee marching 2 length holding rainbow floats -heel raises 10x -hip 3 ways 10x Bil holding on for balance -horseback bicycle in the corner 2 min  PATIENT EDUCATION:  Education details: Issued HEP Person educated: Patient Education method: Explanation, Demonstration, and Handouts Education comprehension:  verbalized understanding and returned demonstration  HOME EXERCISE PROGRAM: Access Code: [email or web address] URL: [email or web address] Date: 11/18/2023 Prepared by: Jarrell Laming  Exercises - Standing March with Counter Support  - 1 x daily - 7 x weekly - 2 sets - 10 reps - Standing Hip Abduction with Unilateral Counter Support  - 1 x daily - 7 x weekly - 2 sets - 10 reps - Standing Hip Extension with Unilateral Counter Support  - 1 x daily - 7 x weekly - 2 sets - 10 reps - Shoulder External Rotation and Scapular Retraction with Resistance  - 1 x daily - 7 x weekly - 2 sets - 10 reps - Standing Shoulder Horizontal Abduction with Resistance  - 1 x daily - 7 x weekly - 2 sets - 10 reps - Standing Shoulder Row with Anchored Resistance  - 1 x daily - 7 x weekly - 2 sets - 10 reps - Standing Anti-Rotation Press with Anchored Resistance  - 1 x daily - 7 x weekly - 2 sets - 10 reps - Shoulder extension with resistance - Neutral  - 1 x daily - 7 x weekly - 2 sets - 10 reps - Squat with Chair Touch  - 1 x daily - 7 x weekly - 2 sets - 10 reps - Seated Hamstring Stretch  - 1 x daily - 7 x weekly - 2 reps - 20 sec hold - Hip Flexor Stretch with Chair  - 1 x daily - 7 x weekly - 2 reps - 20 sec hold  ASSESSMENT:  CLINICAL IMPRESSION: Pt careful to move with caution because of slight dizziness. This did not negatively impact his session today. He reports being very sore in his muscles after the initial sessison so increases today were modest.No increased pain and it seemed he was steadier moving in the pool today.  OBJECTIVE IMPAIRMENTS: decreased balance, decreased coordination, difficulty walking, decreased strength, dizziness, increased muscle spasms, postural dysfunction, and pain.   ACTIVITY LIMITATIONS: carrying, lifting, bending, standing, and locomotion level  PARTICIPATION LIMITATIONS: cleaning and community activity  PERSONAL FACTORS: Fitness, Time since onset of  injury/illness/exacerbation, and 3+ comorbidities: dizziness, carotid stenosis, Claudication are also affecting patient's functional outcome.  REHAB POTENTIAL: Good  CLINICAL DECISION MAKING: Evolving/moderate complexity  EVALUATION COMPLEXITY: Moderate   GOALS: Goals reviewed with patient? Yes  SHORT TERM GOALS: Target date: 12/11/2023 Patient will be independent with initial HEP.  Baseline: Goal status: Ongoing  2.  Patient will report at least a 25% improvement in symptoms since initial evaluation.  Baseline:  Goal status: Ongoing  3.  Patient will participate in 6 minute walk test to establish a baseline.  Baseline:  Goal status: Met on 12/06/23 (979 ft)   LONG TERM GOALS: Target date: 01/13/2024  Patient will be independent with advanced HEP to allow him for self progression after discharge.  Baseline:  Goal status: INITIAL  2.  Patient will increase Lower Extremity Functional Scale to at least 65% to demonstrate improvements in functional mobility.  Baseline: 52.5% Goal status: INITIAL  3.  Patient will report ability to walk at least 20 minutes without increased pain to allow patient to ambulate community distances.  Baseline:  Goal status: INITIAL  4.  Patient will improve modified CTSIB to 120/120 to demonstrate decreased risk of falling.  Baseline:  Goal status: INITIAL  5.  Patient will increase UE/LE functional strength to [facility] to allow him to return to the gym.  Baseline:  Goal status: INITIAL     PLAN:  PT FREQUENCY: 1-2x/week  PT DURATION: 8 weeks  PLANNED INTERVENTIONS: 97164- PT Re-evaluation, 97750- Physical Performance Testing, 97110-Therapeutic exercises, 97530- Therapeutic activity, 97112- Neuromuscular re-education, 97535- Self Care, 02859- Manual therapy, [phone]- Gait training, [phone]- Canalith repositioning, [id number]- Aquatic Therapy, [phone]- Electrical stimulation (unattended), [phone]- Electrical stimulation (manual), [id number]- Vasopneumatic device,  [id number]- Ultrasound, [id number]- Traction (mechanical), [id number]- Ionotophoresis 4mg /ml Dexamethasone , 79439 (1-2 muscles), 20561 (3+ muscles)- Dry Needling, Patient/Family education, Balance training, Stair training, Taping, Joint mobilization, Joint manipulation, Spinal manipulation, Spinal mobilization, Vestibular training, Cryotherapy, and Moist heat  PLAN FOR NEXT SESSION: Assess and progress HEP as indicated, strengthening, flexibility, manual/dry needling as indicated, ask about dizziness and if it is improved  Delon Darner, PTA 12/14/23 8:40 AM      [facility] [address] [place], [place] [address] Phone # [phone] Fax [phone]

## 2023-12-14 NOTE — Progress Notes (Signed)
 12/14/2023 Jonathan Savage [id number] [birth year]   CHIEF COMPLAINT: Constipation, rectal pain   HISTORY OF PRESENT ILLNESS: Jonathan Savage is an 83 year old male with a past medical history of hypertension, CVA 15 years ago, carotid artery disease, kidney stones, CKD and colon polyps. He presents to our office today as referred by Thersia Schuyler Stark NP for further evaluation regarding constipation.  The patient also endorses having significant rectal pain.  He describes having chronic constipation constipation for years which is notably worsened over the past 4 months.  He passes a BM every 3 to 4 days and stools are more narrow.  He takes MiraLAX several days weekly as needed as well as Colace.  He describes having rectal pain that is worse when he sits on a hard seat, describes feeling like he is sitting on a stick.  No rectal bleeding.  No fevers or weight loss.  He presented to the ED 10/28/2023 due to having constipation, no BM x 4 days.  CTAP without contrast showed diverticulosis without evidence of diverticulitis without obstruction and there was evidence of possible urinary retention.  He was instructed to continue Colace, drink prune juice and follow-up with his PCP.  He stated undergoing 2 or 3 colonoscopies in his lifetime when he lived in [name] .  He stated his most recent colonoscopy was 6 years ago in [place], [name]  and a few polyps were removed and he was noted to have diverticulosis.  We are unable to access any of his colonoscopy records at this time, he cannot recall where he had these procedures done in [name] .  No known family history of colorectal cancer.  He has infrequent indigestion described as gas buildup in his stomach radiates up in his esophagus which occurs after eating acidic foods or a large meal.  He takes an OTC antiacid with symptom relief.  No dysphagia.  He takes ASA 81 mg daily, no other NSAIDs.  He underwent a partial prostatectomy in the  past due to having an enlarged prostate, he denies having prostate cancer.     Latest Ref Rng & Units 10/27/2023   10:23 PM 10/12/2023   10:14 AM 03/04/2023   11:04 AM  CBC  WBC 4.0 - 10.5 K/uL 7.9  7.9  7.2   Hemoglobin 13.0 - 17.0 g/dL 86.6  85.8  86.8   Hematocrit 39.0 - 52.0 % 40.5  44.2  40.3   Platelets 150 - 400 K/uL 244  267  280         Latest Ref Rng & Units 10/27/2023   10:23 PM 10/12/2023   10:14 AM 07/11/2023   11:01 AM  CMP  Glucose 70 - 99 mg/dL 94  92  83   BUN 8 - 23 mg/dL 20  22  27    Creatinine 0.61 - 1.24 mg/dL 8.29  8.66  8.65   Sodium 135 - 145 mmol/L 134  140  141   Potassium 3.5 - 5.1 mmol/L 4.5  4.5  4.8   Chloride 98 - 111 mmol/L 98  101  103   CO2 22 - 32 mmol/L 26  22  21    Calcium  8.9 - 10.3 mg/dL 89.5  89.8  9.8   Total Protein 6.5 - 8.1 g/dL 7.5     Total Bilirubin 0.0 - 1.2 mg/dL 0.2     Alkaline Phos 38 - 126 U/L 78     AST 15 - 41 U/L 23     ALT  0 - 44 U/L 11        CTAP without contrast 10/28/2023: FINDINGS: Lower chest: Calcifications along the right hemidiaphragm are noted stable from 2022 consistent with a benign etiology.   Hepatobiliary: Gallbladder is well distended with evidence of a single calcified gallstone. The liver shows scattered hypodensities likely representing cysts stable from the prior exam.   Pancreas: Unremarkable. No pancreatic ductal dilatation or surrounding inflammatory changes.   Spleen: Normal in size without focal abnormality.   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 The adrenal glands are within normal limits. Left kidney demonstrates mild fullness of the collecting system which extends inferiorly although no definitive stone is seen. The bladder is partially distended.   Stomach/Bowel: Scattered diverticular change of the colon is noted. No obstructive changes are seen. The appendix is within normal limits. Small bowel and stomach are within normal limits.    Vascular/Lymphatic: Aortic atherosclerosis. No enlarged abdominal or pelvic lymph nodes.   Reproductive: Prostate is unremarkable.   Other: No abdominal wall hernia or abnormality. No abdominopelvic ascites.   Musculoskeletal: No acute or significant osseous findings.   IMPRESSION: Diverticulosis without diverticulitis.   Mild fullness of the left renal collecting system although no definitive stone is seen. This may be related to the distended bladder.   Cholelithiasis without complicating factors.   No definitive constipation is noted.    Past Medical History:  Diagnosis Date   Carotid artery occlusion    Complication of anesthesia    pt states following last surgery he was awake for over 24 hours following surgery   Hypertension    Stroke [facility])    Past Surgical History:  Procedure Laterality Date   ENDARTERECTOMY Left 08/22/2019   Procedure: LEFT CAROTID ENDARTERECTOMY WITH BOVIE PATCH ANGIOPLASTY;  Surgeon: Serene Gaile ORN, MD;  Location: [facility] OR;  Service: Vascular;  Laterality: Left;   LAPAROSCOPIC NEPHRECTOMY Right    NECK SURGERY     PROSTATE SURGERY     Social History: Remote smoker, quit smoking cigarettes at age 89.  Rare alcohol use. No drug use.   Family History: No known family history of esophageal, gastric or colon cancer.  Father with dementia.  Father with heart failure.  Brother had a CVA.  Allergies  Allergen Reactions   Hydrochlorothiazide     Other reaction(s): Other (See Comments) Leg pain       Outpatient Encounter Medications as of 12/14/2023  Medication Sig   amLODipine  (NORVASC ) 2.5 MG tablet Take 1 tablet (2.5 mg total) by mouth in the morning and at bedtime.   aspirin  EC 81 MG tablet Take 81 mg by mouth daily.   hydrALAZINE  (APRESOLINE ) 10 MG tablet TAKE 1 TABLET (10 MG TOTAL) BY MOUTH IN THE MORNING AND AT BEDTIME   irbesartan  (AVAPRO ) 150 MG tablet Take 1 tablet (150 mg total) by mouth daily. Alone with 75mg  OF Irbesartan     irbesartan  (AVAPRO ) 75 MG tablet Take 1 tablet (75 mg total) by mouth daily.   Omega 3-6-9 Fatty Acids (OMEGA 3-6-9 COMPLEX PO) Take by mouth.   rosuvastatin  (CRESTOR ) 10 MG tablet TAKE 1 TABLET BY MOUTH EVERY DAY (Patient not taking: Reported on 11/18/2023)   vitamin B-12 (CYANOCOBALAMIN) 1000 MCG tablet Take 1,000 mcg by mouth daily.   vitamin C (ASCORBIC ACID) 500 MG tablet Take 500 mg by mouth daily.   XDEMVY 0.25 % SOLN INSTILL 1 DROP INTO BOTH EYES TWICE DAILY FOR 6 WEEKS   No facility-administered encounter medications on file  as of 12/14/2023.   REVIEW OF SYSTEMS:  Gen: Denies fever, sweats or chills. No weight loss.  CV: Denies chest pain, palpitations or edema. Resp: Denies cough, shortness of breath of hemoptysis.  GI: See HPI. GU: Denies urinary burning, blood in urine, increased urinary frequency or incontinence. MS: Denies joint pain, muscles aches or weakness. Derm: Denies rash, itchiness, skin lesions or unhealing ulcers. Psych: Denies depression, anxiety, memory loss or confusion. Heme: Denies bruising, easy bleeding. Neuro:  Denies headaches, dizziness or paresthesias. Endo:  Denies any problems with DM, thyroid or adrenal function.  PHYSICAL EXAM: BP (!) 140/68   Pulse 64   Ht 5' 6 (1.676 m)   Wt 149 lb 2 oz (67.6 kg)   BMI 24.07 kg/m   Wt Readings from Last 3 Encounters:  12/14/23 149 lb 2 oz (67.6 kg)  11/10/23 143 lb (64.9 kg)  11/07/23 145 lb 9.6 oz (66 kg)    General: 83 year old male in no acute distress. Head: Normocephalic and atraumatic. Eyes:  Sclerae non-icteric, conjunctive pink. Ears: Normal auditory acuity. Mouth: Dentition intact. No ulcers or lesions.  Neck: Supple, no lymphadenopathy or thyromegaly.  Lungs: Clear bilaterally to auscultation without wheezes, crackles or rhonchi. Heart: Regular rate and rhythm. No murmur, rub or gallop appreciated.  Abdomen: Soft, nontender, nondistended. No masses. No hepatosplenomegaly. Normoactive bowel  sounds x 4 quadrants. Soft abdominal bruit, more pronounced to the lower abdomen, softer in the upper abdomen.  Rectal: No external hemorrhoids, fissure or obvious anorectal abscess. Internal hemorrhoids without prolapse. Prostate grossly enlarged (examined left lateral position). No blood, mucous or purulent drainage in rectum. Gil CMA present during exam.  Musculoskeletal: Symmetrical with no gross deformities. Skin: Warm and dry. No rash or lesions on visible extremities. Extremities: No edema. Neurological: Alert oriented x 4, no focal deficits.  Psychological: Alert and cooperative. Normal mood and affect.  ASSESSMENT AND PLAN:  83 year old male with chronic constipation which has progressively worsened over the past 4 months with associated rectal pain, worsens when sitting on a hard surface. CTAP without contrast 10/28/2023 showed diverticulosis without diverticulitis. - Diagnostic colonoscopy benefits and risks discussed including risk with sedation, risk of bleeding, perforation and infection  - Patient to follow up with PCP, recommend urology evaluation secondary to enlarged prostate with history of partial prostatectomy.  - Miralax once or twice daily to increase stool output  - Benefiber 1 tablespoon daily as tolerated  - Drink 8 glasses of water daily - Dietary fiber as tolerated  Chronic constipation - See plan above  - TSH  Reported history of colon polyps, last colonoscopy was completed approximately 6 years ago in [name]  and a few polyps removed at that time.  Records are not accessible. - See plan above  Past CVA, carotid artery disease  CKD - CMP  Mildly elevated calcium  level - CMP       CC:  Caudle, Thersia Bitters, *

## 2023-12-14 NOTE — Telephone Encounter (Signed)
 I called [facility] [phone]) and they said he had a procedure in 2007 but they are unable to access records. I called [facility] [phone])  and they said that they have a record for 2007 as well. They do the procedure for [facility] and she told me that he had a procedure on 03-31-05 and a OV on 03-18-05 but nothing is scanned in the chart. She did say I can fax a MR to [phone] and should probably be able to access it since they have their own medical record center.   Places I called that told me they don't have this patient: [facility] [name]  [phone]) Nor[facility] Bud [phone]) [facility] [phone]) [facility] [phone])   [facility] number didn't work from [employer] [phone]) Nelwyn Latino DO [phone]) line cut off on me everything I would be connected. Used option 1,2,3 and it kept cutting off

## 2023-12-15 ENCOUNTER — Ambulatory Visit: Payer: Self-pay | Admitting: Nurse Practitioner

## 2023-12-15 ENCOUNTER — Encounter: Payer: Self-pay | Admitting: Nurse Practitioner

## 2023-12-15 NOTE — Progress Notes (Signed)
 Noted

## 2023-12-16 ENCOUNTER — Ambulatory Visit: Admitting: Rehabilitative and Restorative Service Providers"

## 2023-12-16 ENCOUNTER — Telehealth: Payer: Self-pay | Admitting: Rehabilitative and Restorative Service Providers"

## 2023-12-16 NOTE — Telephone Encounter (Signed)
 Called patient secondary to missed visit on 12/16/23 at 8:45 AM.  Unable to leave a message.

## 2023-12-20 ENCOUNTER — Encounter (HOSPITAL_BASED_OUTPATIENT_CLINIC_OR_DEPARTMENT_OTHER): Payer: Self-pay | Admitting: Family Medicine

## 2023-12-21 ENCOUNTER — Ambulatory Visit: Admitting: Physical Therapy

## 2023-12-21 ENCOUNTER — Encounter: Payer: Self-pay | Admitting: Physical Therapy

## 2023-12-21 DIAGNOSIS — R262 Difficulty in walking, not elsewhere classified: Secondary | ICD-10-CM

## 2023-12-21 DIAGNOSIS — R293 Abnormal posture: Secondary | ICD-10-CM

## 2023-12-21 DIAGNOSIS — R252 Cramp and spasm: Secondary | ICD-10-CM

## 2023-12-21 DIAGNOSIS — R2689 Other abnormalities of gait and mobility: Secondary | ICD-10-CM

## 2023-12-21 DIAGNOSIS — R278 Other lack of coordination: Secondary | ICD-10-CM

## 2023-12-21 DIAGNOSIS — M6281 Muscle weakness (generalized): Secondary | ICD-10-CM

## 2023-12-21 NOTE — Therapy (Signed)
 OUTPATIENT PHYSICAL THERAPY TREATMENT NOTE   Patient Name: Fred Franzen MRN: [id number] DOB:05-04-1940, 83 y.o., male Today's Date: 12/21/2023  END OF SESSION:  PT End of Session - 12/21/23 1015     Visit Number 5    Date for Recertification  01/13/24    Authorization Type Medicare/BCBS    Progress Note Due on Visit 10    PT Start Time 1015    PT Stop Time 1053    PT Time Calculation (min) 38 min    Activity Tolerance Patient tolerated treatment well    Behavior During Therapy Flat affect           Past Medical History:  Diagnosis Date   Carotid artery occlusion    Complication of anesthesia    pt states following last surgery he was awake for over 24 hours following surgery   Hypertension    Stroke [facility])    Past Surgical History:  Procedure Laterality Date   ENDARTERECTOMY Left 08/22/2019   Procedure: LEFT CAROTID ENDARTERECTOMY WITH BOVIE PATCH ANGIOPLASTY;  Surgeon: Serene Gaile ORN, MD;  Location: [facility] OR;  Service: Vascular;  Laterality: Left;   LAPAROSCOPIC NEPHRECTOMY Right    NECK SURGERY     PROSTATE SURGERY     Patient Active Problem List   Diagnosis Date Noted   Diverticulosis 11/10/2023   Postural dizziness with near syncope 10/19/2023   Chronic constipation 10/19/2023   Neuropathy of both feet 05/09/2023   Numbness and tingling of both lower extremities 11/04/2022   Acute cough 10/25/2022   Lab test positive for detection of COVID-19 virus 10/25/2022   Erectile dysfunction 07/21/2022   Elevated serum creatinine 05/10/2022   CKD stage 3a, GFR 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) 05/10/2022   Prediabetes 10/29/2020   Carotid stenosis, asymptomatic 08/22/2019   Claudication in peripheral vascular disease 06/08/2019   Essential hypertension 06/05/2019   Hyperlipidemia 06/05/2019   Carotid bruit 06/05/2019    PCP: Knute Thersia Bitters, FNP  REFERRING PROVIDER: Emiliano Leonce CROME, PA-C  REFERRING DIAG: [phone] (ICD-10-CM) - Bilateral leg  weakness  THERAPY DIAG:  Muscle weakness (generalized)  Balance problem  Cramp and spasm  Difficulty in walking, not elsewhere classified  Other lack of coordination  Abnormal posture  Rationale for Evaluation and Treatment: Rehabilitation  ONSET DATE: Summer 2025  SUBJECTIVE:   SUBJECTIVE STATEMENT: Feeling kind of down today. Might have to have a colonoscopy, I have a lot of pressure there when I sit and when I drive.  PERTINENT HISTORY: Essential Hypertension, Claudication in the peripheral vascular disease, carotid stenosis, pt has one kidney, Hx of syncope, recent COVID on 10/25/2022   PAIN:  Are you having pain? Yes: NPRS scale: 5/10 Pain location: generally lower legs, but sometimes right side of back Pain description: feet feels like a burning sensation, sharp on the right side of back Aggravating factors: walking, certain movements Relieving factors: rest  PRECAUTIONS: None  RED FLAGS: None   WEIGHT BEARING RESTRICTIONS: No  FALLS:  Has patient fallen in last 6 months? No  LIVING ENVIRONMENT: Lives with: lives alone Lives in: House/apartment Stairs: one story Has following equipment at home: Grab bars  OCCUPATION: Retired  PLOF: Independent and Leisure: clocks, firearms/knives, cars, woodworking/construction  PATIENT GOALS: To strengthen my legs and get better balance and help with the numbness some  NEXT MD VISIT: PCP February 08, 2024  OBJECTIVE:  Note: Objective measures were completed at Evaluation unless otherwise noted.  DIAGNOSTIC FINDINGS:  Thoracic Radiograph on 10/25/2022: IMPRESSION: No acute  abnormality seen.  Lumbar Radiograph on 11/04/2022: IMPRESSION: Mild multilevel degenerative disc disease. No acute abnormality seen.  PATIENT SURVEYS:  LEFS  Extreme difficulty/unable (0), Quite a bit of difficulty (1), Moderate difficulty (2), Little difficulty (3), No difficulty (4) Survey date:  11/18/23  Any of your usual work,  housework or school activities 2  2. Usual hobbies, recreational or sporting activities 2  3. Getting into/out of the bath 4  4. Walking between rooms 4  5. Putting on socks/shoes 4  6. Squatting  1  7. Lifting an object, like a bag of groceries from the floor 4  8. Performing light activities around your home 3  9. Performing heavy activities around your home 1  10. Getting into/out of a car 3  11. Walking 2 blocks 2  12. Walking 1 mile 0  13. Going up/down 10 stairs (1 flight) 3  14. Standing for 1 hour 2  15.  sitting for 1 hour 3  16. Running on even ground 0  17. Running on uneven ground 0  18. Making sharp turns while running fast 0  19. Hopping  0  20. Rolling over in bed 4  Score total:  42/80 = 52.5%     COGNITION: Overall cognitive status: Within functional limits for tasks assessed     SENSATION: Pt reports burning sensation in the bottoms of his feet   MUSCLE LENGTH: Hamstrings: WFL  POSTURE: rounded shoulders  PALPATION: Muscle spasms noted in right paraspinals  LOWER EXTREMITY ROM:  WFL  LOWER EXTREMITY MMT:  Eval: Bilateral quad strength of 5/5 Bilateral hip strength of 4 to 4+/5  LOWER EXTREMITY SPECIAL TESTS:  Slump test:  negative bilat  FUNCTIONAL TESTS:  Eval: 5 times sit to stand: 13.32 sec MCTSIB: Condition 1: 30 sec, Condition 2: 30 sec, Condition 3: 30 sec, Condition 4: 5 sec, and Total Score: 95/120  GAIT: Distance walked: >500 ft Assistive device utilized: None Level of assistance: Complete Independence Comments: Patient reports that sometimes he gets feeling weaker  VESTIBULAR ASSESSMENT:  12/06/2023: Smooth pursuit:  saccades noted when tracking from right back to mid-line Saccades:  WFL Gaze stabilization is [facility] Head Impulse Test:  slight nystamgus noted, but difficult to assess secondary to patient guarding movement Dix Hallpike:  negative to the left, positive to the right                                                                                                                                TREATMENT DATE:   12/21/23:Pt arrives for aquatic physical therapy. Treatment took place in 3.5-5.5 feet of water. Water temperature was 91 degrees F. Pt entered the pool via stairs independently. Pt performed seated LE AROM exercises 20x in all planes, with concurrent discussion about current status. -75% depth water walk holding rainbow floats for balance in each direction 8x each. - high knee marching 4 length holding rainbow floats -heel raises 15x2 -hip  3 ways 15x Bil holding on for balance -Tandem stance holding rainbow weights for balance assist 30 sec 2x each side, then tandem walk 4 lengths holding 2 rainbow floats -Single leg balance both LE 10 sec 3x:  -Side steps 2x10 on first step holding rail with 1 arm. -horseback bicycle in the corner 5 min, then Xcountry legs 10x   12/14/23:Pt arrives for aquatic physical therapy. Treatment took place in 3.5-5.5 feet of water. Water temperature was 90 degrees F. Pt entered the pool via stairs independently. Pt performed seated LE AROM exercises 20x in all planes, with concurrent discussion about current status and verbal education on water principles we will use. Pt verbally understood.  -75% depth water walk holding rainbow floats for balance in each direction 6x each. - high knee marching 4 length holding rainbow floats -heel raises 10x2 -hip 3 ways 15x Bil holding on for balance -Tandem stance holding rainbow weights for balance assist 30 sec 2x each side -horseback bicycle in the corner 3 min, then Xcountry legs 1 min   12/06/2023: Nustep level 6 x5 min with PT present to discuss status Vestibular assessment (see above) Dix Hallpike positive on the right.  Proceeded with canalith repositioning x2 with Epley maneuver 6 min walk test:  979 ft Seated shoulder horizontal abduction with red tband 2x10 Standing rows with red tband 2x10 Standing shoulder  extension with red tband 2x10    PATIENT EDUCATION:  Education details: Issued HEP Person educated: Patient Education method: Explanation, Demonstration, and Handouts Education comprehension: verbalized understanding and returned demonstration  HOME EXERCISE PROGRAM: Access Code: [email or web address] URL: [email or web address] Date: 11/18/2023 Prepared by: Jarrell Laming  Exercises - Standing March with Counter Support  - 1 x daily - 7 x weekly - 2 sets - 10 reps - Standing Hip Abduction with Unilateral Counter Support  - 1 x daily - 7 x weekly - 2 sets - 10 reps - Standing Hip Extension with Unilateral Counter Support  - 1 x daily - 7 x weekly - 2 sets - 10 reps - Shoulder External Rotation and Scapular Retraction with Resistance  - 1 x daily - 7 x weekly - 2 sets - 10 reps - Standing Shoulder Horizontal Abduction with Resistance  - 1 x daily - 7 x weekly - 2 sets - 10 reps - Standing Shoulder Row with Anchored Resistance  - 1 x daily - 7 x weekly - 2 sets - 10 reps - Standing Anti-Rotation Press with Anchored Resistance  - 1 x daily - 7 x weekly - 2 sets - 10 reps - Shoulder extension with resistance - Neutral  - 1 x daily - 7 x weekly - 2 sets - 10 reps - Squat with Chair Touch  - 1 x daily - 7 x weekly - 2 sets - 10 reps - Seated Hamstring Stretch  - 1 x daily - 7 x weekly - 2 reps - 20 sec hold - Hip Flexor Stretch with Chair  - 1 x daily - 7 x weekly - 2 reps - 20 sec hold  ASSESSMENT:  CLINICAL IMPRESSION: Pt arrives a bit meloncholy today. He is feeling down about the possibility of having a colonoscopy.  He reports having difficulty sitting and driving. Basic exercises for leg strength progressed which he has no issues with. Balance on one leg and tandem stance and walking more difficult.   OBJECTIVE IMPAIRMENTS: decreased balance, decreased coordination, difficulty walking, decreased strength, dizziness, increased muscle spasms, postural dysfunction, and pain.  ACTIVITY LIMITATIONS: carrying, lifting, bending, standing, and locomotion level  PARTICIPATION LIMITATIONS: cleaning and community activity  PERSONAL FACTORS: Fitness, Time since onset of injury/illness/exacerbation, and 3+ comorbidities: dizziness, carotid stenosis, Claudication are also affecting patient's functional outcome.   REHAB POTENTIAL: Good  CLINICAL DECISION MAKING: Evolving/moderate complexity  EVALUATION COMPLEXITY: Moderate   GOALS: Goals reviewed with patient? Yes  SHORT TERM GOALS: Target date: 12/11/2023 Patient will be independent with initial HEP.  Baseline: Goal status: Ongoing  2.  Patient will report at least a 25% improvement in symptoms since initial evaluation.  Baseline:  Goal status: Ongoing  3.  Patient will participate in 6 minute walk test to establish a baseline.  Baseline:  Goal status: Met on 12/06/23 (979 ft)   LONG TERM GOALS: Target date: 01/13/2024  Patient will be independent with advanced HEP to allow him for self progression after discharge.  Baseline:  Goal status: INITIAL  2.  Patient will increase Lower Extremity Functional Scale to at least 65% to demonstrate improvements in functional mobility.  Baseline: 52.5% Goal status: INITIAL  3.  Patient will report ability to walk at least 20 minutes without increased pain to allow patient to ambulate community distances.  Baseline:  Goal status: INITIAL  4.  Patient will improve modified CTSIB to 120/120 to demonstrate decreased risk of falling.  Baseline:  Goal status: INITIAL  5.  Patient will increase UE/LE functional strength to [facility] to allow him to return to the gym.  Baseline:  Goal status: INITIAL     PLAN:  PT FREQUENCY: 1-2x/week  PT DURATION: 8 weeks  PLANNED INTERVENTIONS: 97164- PT Re-evaluation, 97750- Physical Performance Testing, 97110-Therapeutic exercises, 97530- Therapeutic activity, 97112- Neuromuscular re-education, 97535- Self Care, 02859- Manual  therapy, [phone]- Gait training, [phone]- Canalith repositioning, [id number]- Aquatic Therapy, [phone]- Electrical stimulation (unattended), [phone]- Electrical stimulation (manual), [id number]- Vasopneumatic device, [id number]- Ultrasound, [id number]- Traction (mechanical), [id number]- Ionotophoresis 4mg /ml Dexamethasone , 79439 (1-2 muscles), 20561 (3+ muscles)- Dry Needling, Patient/Family education, Balance training, Stair training, Taping, Joint mobilization, Joint manipulation, Spinal manipulation, Spinal mobilization, Vestibular training, Cryotherapy, and Moist heat  PLAN FOR NEXT SESSION: Assess and progress HEP as indicated, strengthening, flexibility, manual/dry needling as indicated, ask about dizziness and if it is improved  Delon Darner, PTA 12/21/2023 10:50 AM      [facility] [address], [address] [place], [place] [address] Phone # [phone] Fax [phone]

## 2024-01-07 ENCOUNTER — Other Ambulatory Visit (HOSPITAL_BASED_OUTPATIENT_CLINIC_OR_DEPARTMENT_OTHER): Payer: Self-pay | Admitting: Family Medicine

## 2024-01-12 ENCOUNTER — Telehealth: Payer: Self-pay | Admitting: Nurse Practitioner

## 2024-01-12 NOTE — Telephone Encounter (Signed)
 error

## 2024-01-17 ENCOUNTER — Encounter: Payer: Self-pay | Admitting: Internal Medicine

## 2024-01-17 ENCOUNTER — Ambulatory Visit: Admitting: Internal Medicine

## 2024-01-17 VITALS — BP 124/61 | HR 60 | Temp 98.1°F | Resp 14 | Ht 66.0 in | Wt 149.0 lb

## 2024-01-17 DIAGNOSIS — K635 Polyp of colon: Secondary | ICD-10-CM

## 2024-01-17 DIAGNOSIS — K5730 Diverticulosis of large intestine without perforation or abscess without bleeding: Secondary | ICD-10-CM

## 2024-01-17 DIAGNOSIS — R194 Change in bowel habit: Secondary | ICD-10-CM

## 2024-01-17 DIAGNOSIS — K648 Other hemorrhoids: Secondary | ICD-10-CM | POA: Diagnosis not present

## 2024-01-17 DIAGNOSIS — D122 Benign neoplasm of ascending colon: Secondary | ICD-10-CM

## 2024-01-17 DIAGNOSIS — Z1211 Encounter for screening for malignant neoplasm of colon: Secondary | ICD-10-CM

## 2024-01-17 DIAGNOSIS — K6289 Other specified diseases of anus and rectum: Secondary | ICD-10-CM

## 2024-01-17 DIAGNOSIS — Z860100 Personal history of colon polyps, unspecified: Secondary | ICD-10-CM | POA: Diagnosis not present

## 2024-01-17 DIAGNOSIS — K5900 Constipation, unspecified: Secondary | ICD-10-CM

## 2024-01-17 MED ORDER — SODIUM CHLORIDE 0.9 % IV SOLN: 500.0000 mL | Freq: Once | INTRAVENOUS | Status: DC

## 2024-01-17 NOTE — Progress Notes (Signed)
 Expand All Collapse All        12/14/2023 Jonathan Savage [facility] [id number] 08-04-1940     CHIEF COMPLAINT: Constipation, rectal pain    HISTORY OF PRESENT ILLNESS: Jonathan Savage is an 84 year old male with a past medical history of hypertension, CVA 15 years ago, carotid artery disease, kidney stones, CKD and colon polyps. He presents to our office today as referred by Thersia Schuyler Stark NP for further evaluation regarding constipation.  The patient also endorses having significant rectal pain.  He describes having chronic constipation constipation for years which is notably worsened over the past 4 months.  He passes a BM every 3 to 4 days and stools are more narrow.  He takes MiraLAX several days weekly as needed as well as Colace.  He describes having rectal pain that is worse when he sits on a hard seat, describes feeling like he is sitting on a stick.  No rectal bleeding.  No fevers or weight loss.   He presented to the ED 10/28/2023 due to having constipation, no BM x 4 days.  CTAP without contrast showed diverticulosis without evidence of diverticulitis without obstruction and there was evidence of possible urinary retention.  He was instructed to continue Colace, drink prune juice and follow-up with his PCP.   He stated undergoing 2 or 3 colonoscopies in his lifetime when he lived in [name] .  He stated his most recent colonoscopy was 6 years ago in [place], [name]  and a few polyps were removed and he was noted to have diverticulosis.  We are unable to access any of his colonoscopy records at this time, he cannot recall where he had these procedures done in [name] .  No known family history of colorectal cancer.  He has infrequent indigestion described as gas buildup in his stomach radiates up in his esophagus which occurs after eating acidic foods or a large meal.  He takes an OTC antiacid with symptom relief.  No dysphagia.  He takes ASA 81 mg daily, no other NSAIDs.  He  underwent a partial prostatectomy in the past due to having an enlarged prostate, he denies having prostate cancer.       Latest Ref Rng & Units 10/27/2023   10:23 PM 10/12/2023   10:14 AM 03/04/2023   11:04 AM  CBC  WBC 4.0 - 10.5 K/uL 7.9  7.9  7.2   Hemoglobin 13.0 - 17.0 g/dL 86.6  85.8  86.8   Hematocrit 39.0 - 52.0 % 40.5  44.2  40.3   Platelets 150 - 400 K/uL 244  267  280           Latest Ref Rng & Units 10/27/2023   10:23 PM 10/12/2023   10:14 AM 07/11/2023   11:01 AM  CMP  Glucose 70 - 99 mg/dL 94  92  83   BUN 8 - 23 mg/dL 20  22  27    Creatinine 0.61 - 1.24 mg/dL 8.29  8.66  8.65   Sodium 135 - 145 mmol/L 134  140  141   Potassium 3.5 - 5.1 mmol/L 4.5  4.5  4.8   Chloride 98 - 111 mmol/L 98  101  103   CO2 22 - 32 mmol/L 26  22  21    Calcium  8.9 - 10.3 mg/dL 89.5  89.8  9.8   Total Protein 6.5 - 8.1 g/dL 7.5       Total Bilirubin 0.0 - 1.2 mg/dL 0.2       Alkaline  Phos 38 - 126 U/L 78       AST 15 - 41 U/L 23       ALT 0 - 44 U/L 11           CTAP without contrast 10/28/2023: FINDINGS: Lower chest: Calcifications along the right hemidiaphragm are noted stable from 2022 consistent with a benign etiology.   Hepatobiliary: Gallbladder is well distended with evidence of a single calcified gallstone. The liver shows scattered hypodensities likely representing cysts stable from the prior exam.   Pancreas: Unremarkable. No pancreatic ductal dilatation or surrounding inflammatory changes.   Spleen: Normal in size without focal abnormality.   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 The adrenal glands are within normal limits. Left kidney demonstrates mild fullness of the collecting system which extends inferiorly although no definitive stone is seen. The bladder is partially distended.   Stomach/Bowel: Scattered diverticular change of the colon is noted. No obstructive changes are seen. The appendix is within normal limits. Small bowel  and stomach are within normal limits.   Vascular/Lymphatic: Aortic atherosclerosis. No enlarged abdominal or pelvic lymph nodes.   Reproductive: Prostate is unremarkable.   Other: No abdominal wall hernia or abnormality. No abdominopelvic ascites.   Musculoskeletal: No acute or significant osseous findings.   IMPRESSION: Diverticulosis without diverticulitis.   Mild fullness of the left renal collecting system although no definitive stone is seen. This may be related to the distended bladder.   Cholelithiasis without complicating factors.   No definitive constipation is noted.         Past Medical History:  Diagnosis Date   Carotid artery occlusion     Complication of anesthesia      pt states following last surgery he was awake for over 24 hours following surgery   Hypertension     Stroke [facility])               Past Surgical History:  Procedure Laterality Date   ENDARTERECTOMY Left 08/22/2019    Procedure: LEFT CAROTID ENDARTERECTOMY WITH BOVIE PATCH ANGIOPLASTY;  Surgeon: Serene Gaile ORN, MD;  Location: [facility] OR;  Service: Vascular;  Laterality: Left;   LAPAROSCOPIC NEPHRECTOMY Right     NECK SURGERY       PROSTATE SURGERY            Social History: Remote smoker, quit smoking cigarettes at age 61.  Rare alcohol use. No drug use.    Family History: No known family history of esophageal, gastric or colon cancer.  Father with dementia.  Father with heart failure.  Brother had a CVA.   Allergies       Allergies  Allergen Reactions   Hydrochlorothiazide        Other reaction(s): Other (See Comments) Leg pain               Outpatient Encounter Medications as of 12/14/2023  Medication Sig   amLODipine  (NORVASC ) 2.5 MG tablet Take 1 tablet (2.5 mg total) by mouth in the morning and at bedtime.   aspirin  EC 81 MG tablet Take 81 mg by mouth daily.   hydrALAZINE  (APRESOLINE ) 10 MG tablet TAKE 1 TABLET (10 MG TOTAL) BY MOUTH IN THE MORNING AND AT BEDTIME    irbesartan  (AVAPRO ) 150 MG tablet Take 1 tablet (150 mg total) by mouth daily. Alone with 75mg  OF Irbesartan    irbesartan  (AVAPRO ) 75 MG tablet Take 1 tablet (75 mg total) by mouth daily.   Omega 3-6-9 Fatty Acids (OMEGA 3-6-9  COMPLEX PO) Take by mouth.   rosuvastatin  (CRESTOR ) 10 MG tablet TAKE 1 TABLET BY MOUTH EVERY DAY (Patient not taking: Reported on 11/18/2023)   vitamin B-12 (CYANOCOBALAMIN) 1000 MCG tablet Take 1,000 mcg by mouth daily.   vitamin C (ASCORBIC ACID) 500 MG tablet Take 500 mg by mouth daily.   XDEMVY 0.25 % SOLN INSTILL 1 DROP INTO BOTH EYES TWICE DAILY FOR 6 WEEKS      No facility-administered encounter medications on file as of 12/14/2023.      REVIEW OF SYSTEMS:  Gen: Denies fever, sweats or chills. No weight loss.  CV: Denies chest pain, palpitations or edema. Resp: Denies cough, shortness of breath of hemoptysis.  GI: See HPI. GU: Denies urinary burning, blood in urine, increased urinary frequency or incontinence. MS: Denies joint pain, muscles aches or weakness. Derm: Denies rash, itchiness, skin lesions or unhealing ulcers. Psych: Denies depression, anxiety, memory loss or confusion. Heme: Denies bruising, easy bleeding. Neuro:  Denies headaches, dizziness or paresthesias. Endo:  Denies any problems with DM, thyroid  or adrenal function.   PHYSICAL EXAM: BP (!) 140/68   Pulse 64   Ht 5' 6 (1.676 m)   Wt 149 lb 2 oz (67.6 kg)   BMI 24.07 kg/m       Wt Readings from Last 3 Encounters:  12/14/23 149 lb 2 oz (67.6 kg)  11/10/23 143 lb (64.9 kg)  11/07/23 145 lb 9.6 oz (66 kg)    General: 84 year old male in no acute distress. Head: Normocephalic and atraumatic. Eyes:  Sclerae non-icteric, conjunctive pink. Ears: Normal auditory acuity. Mouth: Dentition intact. No ulcers or lesions.  Neck: Supple, no lymphadenopathy or thyromegaly.  Lungs: Clear bilaterally to auscultation without wheezes, crackles or rhonchi. Heart: Regular rate and rhythm. No  murmur, rub or gallop appreciated.  Abdomen: Soft, nontender, nondistended. No masses. No hepatosplenomegaly. Normoactive bowel sounds x 4 quadrants. Soft abdominal bruit, more pronounced to the lower abdomen, softer in the upper abdomen.  Rectal: No external hemorrhoids, fissure or obvious anorectal abscess. Internal hemorrhoids without prolapse. Prostate grossly enlarged (examined left lateral position). No blood, mucous or purulent drainage in rectum. Gil CMA present during exam.  Musculoskeletal: Symmetrical with no gross deformities. Skin: Warm and dry. No rash or lesions on visible extremities. Extremities: No edema. Neurological: Alert oriented x 4, no focal deficits.  Psychological: Alert and cooperative. Normal mood and affect.   ASSESSMENT AND PLAN:   84 year old male with chronic constipation which has progressively worsened over the past 4 months with associated rectal pain, worsens when sitting on a hard surface. CTAP without contrast 10/28/2023 showed diverticulosis without diverticulitis. - Diagnostic colonoscopy benefits and risks discussed including risk with sedation, risk of bleeding, perforation and infection  - Patient to follow up with PCP, recommend urology evaluation secondary to enlarged prostate with history of partial prostatectomy.  - Miralax once or twice daily to increase stool output  - Benefiber 1 tablespoon daily as tolerated  - Drink 8 glasses of water daily - Dietary fiber as tolerated   Chronic constipation - See plan above  - TSH   Reported history of colon polyps, last colonoscopy was completed approximately 6 years ago in [name]  and a few polyps removed at that time.  Records are not accessible. - See plan above   Past CVA, carotid artery disease   CKD - CMP   Mildly elevated calcium  level - CMP         Recent H&P as above.  No interval change.  Now for colonoscopy.

## 2024-01-17 NOTE — Progress Notes (Signed)
 Transferred to PACU via stretcher.  Not responding to stimulation at this time.  VSS upon leaving procedure room.

## 2024-01-17 NOTE — Patient Instructions (Signed)
 Thank you for letting us take care of your healthcare needs today. Please see handouts given to you on Polyps, Diverticulosis and Hemorrhoids.     YOU HAD AN ENDOSCOPIC PROCEDURE TODAY AT THE [facility]:   Refer to the procedure report that was given to you for any specific questions about what was found during the examination.  If the procedure report does not answer your questions, please call your gastroenterologist to clarify.  If you requested that your care partner not be given the details of your procedure findings, then the procedure report has been included in a sealed envelope for you to review at your convenience later.  YOU SHOULD EXPECT: Some feelings of bloating in the abdomen. Passage of more gas than usual.  Walking can help get rid of the air that was put into your GI tract during the procedure and reduce the bloating. If you had a lower endoscopy (such as a colonoscopy or flexible sigmoidoscopy) you may notice spotting of blood in your stool or on the toilet paper. If you underwent a bowel prep for your procedure, you may not have a normal bowel movement for a few days.  Please Note:  You might notice some irritation and congestion in your nose or some drainage.  This is from the oxygen used during your procedure.  There is no need for concern and it should clear up in a day or so.  SYMPTOMS TO REPORT IMMEDIATELY:  Following lower endoscopy (colonoscopy or flexible sigmoidoscopy):  Excessive amounts of blood in the stool  Significant tenderness or worsening of abdominal pains  Swelling of the abdomen that is new, acute  Fever of 100F or higher   For urgent or emergent issues, a gastroenterologist can be reached at any hour by calling [phone]. Do not use MyChart messaging for urgent concerns.    DIET:  We do recommend a small meal at first, but then you may proceed to your regular diet.  Drink plenty of fluids but you should avoid alcoholic beverages  for 24 hours.  ACTIVITY:  You should plan to take it easy for the rest of today and you should NOT DRIVE or use heavy machinery until tomorrow (because of the sedation medicines used during the test).    FOLLOW UP: Our staff will call the number listed on your records the next business day following your procedure.  We will call around 7:15- 8:00 am to check on you and address any questions or concerns that you may have regarding the information given to you following your procedure. If we do not reach you, we will leave a message.     If any biopsies were taken you will be contacted by phone or by letter within the next 1-3 weeks.  Please call us at [phone] if you have not heard about the biopsies in 3 weeks.    SIGNATURES/CONFIDENTIALITY: You and/or your care partner have signed paperwork which will be entered into your electronic medical record.  These signatures attest to the fact that that the information above on your After Visit Summary has been reviewed and is understood.  Full responsibility of the confidentiality of this discharge information lies with you and/or your care-partner.

## 2024-01-17 NOTE — Op Note (Signed)
 [facility] Patient Name: Jonathan Savage Procedure Date: 01/17/2024 11:30 AM MRN: [id number] Endoscopist: Norleen SAILOR. Abran , MD, [id number] Age: 84 Referring MD:  Date of Birth: 10-11-1940 Gender: Male Account #: [id number] Procedure:                Colonoscopy with cold snare polypectomy x 2 Indications:              High risk colon cancer surveillance: Personal                            history of colonic polyps (elsewhere)no details.                            Seen in the office for change in bowel habits,                            constipation, and pain when sitting on hard surfaces Medicines:                Monitored Anesthesia Care Procedure:                Pre-Anesthesia Assessment:                           - Prior to the procedure, a History and Physical                            was performed, and patient medications and                            allergies were reviewed. The patient's tolerance of                            previous anesthesia was also reviewed. The risks                            and benefits of the procedure and the sedation                            options and risks were discussed with the patient.                            All questions were answered, and informed consent                            was obtained. Prior Anticoagulants: The patient has                            taken no anticoagulant or antiplatelet agents.                            After reviewing the risks and benefits, the patient                            was deemed in satisfactory condition to undergo the  procedure.                           After obtaining informed consent, the colonoscope                            was passed under direct vision. Throughout the                            procedure, the patient's blood pressure, pulse, and                            oxygen saturations were monitored continuously. The                             Olympus Scope SN: [id number] was introduced through                            the anus and advanced to the the cecum, identified                            by appendiceal orifice and ileocecal valve. The                            ileocecal valve, appendiceal orifice, and rectum                            were photographed. The quality of the bowel                            preparation was excellent. The colonoscopy was                            performed without difficulty. The patient tolerated                            the procedure well. The bowel preparation used was                            SUPREP via split dose instruction. Scope In: 11:45:13 AM Scope Out: 11:58:18 AM Scope Withdrawal Time: 0 hours 7 minutes 26 seconds  Total Procedure Duration: 0 hours 13 minutes 5 seconds  Findings:                 Two polyps were found in the ascending colon. The                            polyps were 3 to 4 mm in size. These polyps were                            removed with a cold snare. Resection and retrieval                            were complete.  Multiple diverticula were found in the left colon                            and right colon.                           Internal hemorrhoids were found during                            retroflexion. The hemorrhoids were small.                           The exam was otherwise without abnormality on                            direct and retroflexion views. Complications:            No immediate complications. Estimated blood loss:                            None. Estimated Blood Loss:     Estimated blood loss: none. Impression:               - Two 3 to 4 mm polyps in the ascending colon,                            removed with a cold snare. Resected and retrieved.                           - Diverticulosis in the left colon and in the right                            colon.                           - Internal  hemorrhoids.                           - The examination was otherwise normal on direct                            and retroflexion views. Recommendation:           - Repeat colonoscopy is not recommended for                            surveillance.                           - Patient has a contact number available for                            emergencies. The signs and symptoms of potential                            delayed complications were discussed with the  patient. Return to normal activities tomorrow.                            Written discharge instructions were provided to the                            patient.                           - Resume previous diet.                           - Continue present medications.                           - Await pathology results. Norleen SAILOR. Abran, MD 01/17/2024 12:16:13 PM This report has been signed electronically.

## 2024-01-17 NOTE — Progress Notes (Signed)
 Called to room to assist during endoscopic procedure.  Patient ID and intended procedure confirmed with present staff. Received instructions for my participation in the procedure from the performing physician.

## 2024-01-18 ENCOUNTER — Telehealth: Payer: Self-pay

## 2024-01-18 ENCOUNTER — Ambulatory Visit: Admitting: Physical Therapy

## 2024-01-18 NOTE — Telephone Encounter (Signed)
" °  Follow up Call-     01/17/2024   10:43 AM  Call back number  Post procedure Call Back phone  # [phone]  Permission to leave phone message Yes     Patient questions:  Do you have a fever, pain , or abdominal swelling? No. Pain Score  0 *  Have you tolerated food without any problems? Yes.    Have you been able to return to your normal activities? Yes.    Do you have any questions about your discharge instructions: Diet   No. Medications  No. Follow up visit  No.  Do you have questions or concerns about your Care? No.  Actions: * If pain score is 4 or above: No action needed, pain <4.   "

## 2024-01-19 ENCOUNTER — Ambulatory Visit: Payer: Self-pay | Admitting: Internal Medicine

## 2024-01-19 LAB — SURGICAL PATHOLOGY

## 2024-01-25 ENCOUNTER — Ambulatory Visit: Admitting: Physical Therapy

## 2024-01-25 NOTE — Therapy (Unsigned)
 " OUTPATIENT PHYSICAL THERAPY TREATMENT NOTE   Patient Name: Jonathan Savage MRN: [id number] DOB:14-Aug-1940, 84 y.o., male Today's Date: 01/25/2024  END OF SESSION:     Past Medical History:  Diagnosis Date   Carotid artery occlusion    Complication of anesthesia    pt states following last surgery he was awake for over 24 hours following surgery   Hypertension    Stroke [facility])    Past Surgical History:  Procedure Laterality Date   ENDARTERECTOMY Left 08/22/2019   Procedure: LEFT CAROTID ENDARTERECTOMY WITH BOVIE PATCH ANGIOPLASTY;  Surgeon: Serene Gaile ORN, MD;  Location: [facility] OR;  Service: Vascular;  Laterality: Left;   LAPAROSCOPIC NEPHRECTOMY Right    NECK SURGERY     PROSTATE SURGERY     Patient Active Problem List   Diagnosis Date Noted   Diverticulosis 11/10/2023   Postural dizziness with near syncope 10/19/2023   Chronic constipation 10/19/2023   Neuropathy of both feet 05/09/2023   Numbness and tingling of both lower extremities 11/04/2022   Acute cough 10/25/2022   Lab test positive for detection of COVID-19 virus 10/25/2022   Erectile dysfunction 07/21/2022   Elevated serum creatinine 05/10/2022   CKD stage 3a, GFR 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) 05/10/2022   Prediabetes 10/29/2020   Carotid stenosis, asymptomatic 08/22/2019   Claudication in peripheral vascular disease 06/08/2019   Essential hypertension 06/05/2019   Hyperlipidemia 06/05/2019   Carotid bruit 06/05/2019    PCP: Knute Thersia Bitters, FNP  REFERRING PROVIDER: Emiliano Leonce CROME, PA-C  REFERRING DIAG: [phone] (ICD-10-CM) - Bilateral leg weakness  THERAPY DIAG:  Muscle weakness (generalized)  Balance problem  Cramp and spasm  Difficulty in walking, not elsewhere classified  Other lack of coordination  Abnormal posture  Neck muscle spasm  Cervicalgia  Rationale for Evaluation and Treatment: Rehabilitation  ONSET DATE: Summer 2025  SUBJECTIVE:   SUBJECTIVE STATEMENT: Feeling kind  of down today. Might have to have a colonoscopy, I have a lot of pressure there when I sit and when I drive.  PERTINENT HISTORY: Essential Hypertension, Claudication in the peripheral vascular disease, carotid stenosis, pt has one kidney, Hx of syncope, recent COVID on 10/25/2022   PAIN:  Are you having pain? Yes: NPRS scale: 5/10 Pain location: generally lower legs, but sometimes right side of back Pain description: feet feels like a burning sensation, sharp on the right side of back Aggravating factors: walking, certain movements Relieving factors: rest  PRECAUTIONS: None  RED FLAGS: None   WEIGHT BEARING RESTRICTIONS: No  FALLS:  Has patient fallen in last 6 months? No  LIVING ENVIRONMENT: Lives with: lives alone Lives in: House/apartment Stairs: one story Has following equipment at home: Grab bars  OCCUPATION: Retired  PLOF: Independent and Leisure: clocks, firearms/knives, cars, woodworking/construction  PATIENT GOALS: To strengthen my legs and get better balance and help with the numbness some  NEXT MD VISIT: PCP February 08, 2024  OBJECTIVE:  Note: Objective measures were completed at Evaluation unless otherwise noted.  DIAGNOSTIC FINDINGS:  Thoracic Radiograph on 10/25/2022: IMPRESSION: No acute abnormality seen.  Lumbar Radiograph on 11/04/2022: IMPRESSION: Mild multilevel degenerative disc disease. No acute abnormality seen.  PATIENT SURVEYS:  LEFS  Extreme difficulty/unable (0), Quite a bit of difficulty (1), Moderate difficulty (2), Little difficulty (3), No difficulty (4) Survey date:  11/18/23  Any of your usual work, housework or school activities 2  2. Usual hobbies, recreational or sporting activities 2  3. Getting into/out of the bath 4  4. Walking between  rooms 4  5. Putting on socks/shoes 4  6. Squatting  1  7. Lifting an object, like a bag of groceries from the floor 4  8. Performing light activities around your home 3  9. Performing  heavy activities around your home 1  10. Getting into/out of a car 3  11. Walking 2 blocks 2  12. Walking 1 mile 0  13. Going up/down 10 stairs (1 flight) 3  14. Standing for 1 hour 2  15.  sitting for 1 hour 3  16. Running on even ground 0  17. Running on uneven ground 0  18. Making sharp turns while running fast 0  19. Hopping  0  20. Rolling over in bed 4  Score total:  42/80 = 52.5%     COGNITION: Overall cognitive status: Within functional limits for tasks assessed     SENSATION: Pt reports burning sensation in the bottoms of his feet   MUSCLE LENGTH: Hamstrings: WFL  POSTURE: rounded shoulders  PALPATION: Muscle spasms noted in right paraspinals  LOWER EXTREMITY ROM:  WFL  LOWER EXTREMITY MMT:  Eval: Bilateral quad strength of 5/5 Bilateral hip strength of 4 to 4+/5  LOWER EXTREMITY SPECIAL TESTS:  Slump test:  negative bilat  FUNCTIONAL TESTS:  Eval: 5 times sit to stand: 13.32 sec MCTSIB: Condition 1: 30 sec, Condition 2: 30 sec, Condition 3: 30 sec, Condition 4: 5 sec, and Total Score: 95/120  GAIT: Distance walked: >500 ft Assistive device utilized: None Level of assistance: Complete Independence Comments: Patient reports that sometimes he gets feeling weaker  VESTIBULAR ASSESSMENT:  12/06/2023: Smooth pursuit:  saccades noted when tracking from right back to mid-line Saccades:  WFL Gaze stabilization is [facility] Head Impulse Test:  slight nystamgus noted, but difficult to assess secondary to patient guarding movement Dix Hallpike:  negative to the left, positive to the right                                                                                                                               TREATMENT DATE:   12/21/23:Pt arrives for aquatic physical therapy. Treatment took place in 3.5-5.5 feet of water. Water temperature was 91 degrees F. Pt entered the pool via stairs independently. Pt performed seated LE AROM exercises 20x in all  planes, with concurrent discussion about current status. -75% depth water walk holding rainbow floats for balance in each direction 8x each. - high knee marching 4 length holding rainbow floats -heel raises 15x2 -hip 3 ways 15x Bil holding on for balance -Tandem stance holding rainbow weights for balance assist 30 sec 2x each side, then tandem walk 4 lengths holding 2 rainbow floats -Single leg balance both LE 10 sec 3x:  -Side steps 2x10 on first step holding rail with 1 arm. -horseback bicycle in the corner 5 min, then Xcountry legs 10x   12/14/23:Pt arrives for aquatic physical therapy. Treatment took place in 3.5-5.5 feet of water.  Water temperature was 90 degrees F. Pt entered the pool via stairs independently. Pt performed seated LE AROM exercises 20x in all planes, with concurrent discussion about current status and verbal education on water principles we will use. Pt verbally understood.  -75% depth water walk holding rainbow floats for balance in each direction 6x each. - high knee marching 4 length holding rainbow floats -heel raises 10x2 -hip 3 ways 15x Bil holding on for balance -Tandem stance holding rainbow weights for balance assist 30 sec 2x each side -horseback bicycle in the corner 3 min, then Xcountry legs 1 min   12/06/2023: Nustep level 6 x5 min with PT present to discuss status Vestibular assessment (see above) Dix Hallpike positive on the right.  Proceeded with canalith repositioning x2 with Epley maneuver 6 min walk test:  979 ft Seated shoulder horizontal abduction with red tband 2x10 Standing rows with red tband 2x10 Standing shoulder extension with red tband 2x10    PATIENT EDUCATION:  Education details: Issued HEP Person educated: Patient Education method: Explanation, Demonstration, and Handouts Education comprehension: verbalized understanding and returned demonstration  HOME EXERCISE PROGRAM: Access Code: [email or web address] URL:  [email or web address] Date: 11/18/2023 Prepared by: Jarrell Laming  Exercises - Standing March with Counter Support  - 1 x daily - 7 x weekly - 2 sets - 10 reps - Standing Hip Abduction with Unilateral Counter Support  - 1 x daily - 7 x weekly - 2 sets - 10 reps - Standing Hip Extension with Unilateral Counter Support  - 1 x daily - 7 x weekly - 2 sets - 10 reps - Shoulder External Rotation and Scapular Retraction with Resistance  - 1 x daily - 7 x weekly - 2 sets - 10 reps - Standing Shoulder Horizontal Abduction with Resistance  - 1 x daily - 7 x weekly - 2 sets - 10 reps - Standing Shoulder Row with Anchored Resistance  - 1 x daily - 7 x weekly - 2 sets - 10 reps - Standing Anti-Rotation Press with Anchored Resistance  - 1 x daily - 7 x weekly - 2 sets - 10 reps - Shoulder extension with resistance - Neutral  - 1 x daily - 7 x weekly - 2 sets - 10 reps - Squat with Chair Touch  - 1 x daily - 7 x weekly - 2 sets - 10 reps - Seated Hamstring Stretch  - 1 x daily - 7 x weekly - 2 reps - 20 sec hold - Hip Flexor Stretch with Chair  - 1 x daily - 7 x weekly - 2 reps - 20 sec hold  ASSESSMENT:  CLINICAL IMPRESSION: Pt arrives a bit meloncholy today. He is feeling down about the possibility of having a colonoscopy.  He reports having difficulty sitting and driving. Basic exercises for leg strength progressed which he has no issues with. Balance on one leg and tandem stance and walking more difficult.   OBJECTIVE IMPAIRMENTS: decreased balance, decreased coordination, difficulty walking, decreased strength, dizziness, increased muscle spasms, postural dysfunction, and pain.   ACTIVITY LIMITATIONS: carrying, lifting, bending, standing, and locomotion level  PARTICIPATION LIMITATIONS: cleaning and community activity  PERSONAL FACTORS: Fitness, Time since onset of injury/illness/exacerbation, and 3+ comorbidities: dizziness, carotid stenosis, Claudication are also affecting patient's  functional outcome.   REHAB POTENTIAL: Good  CLINICAL DECISION MAKING: Evolving/moderate complexity  EVALUATION COMPLEXITY: Moderate   GOALS: Goals reviewed with patient? Yes  SHORT TERM GOALS: Target date: 12/11/2023 Patient will be independent with initial HEP.  Baseline: Goal status: Ongoing  2.  Patient will report at least a 25% improvement in symptoms since initial evaluation.  Baseline:  Goal status: Ongoing  3.  Patient will participate in 6 minute walk test to establish a baseline.  Baseline:  Goal status: Met on 12/06/23 (979 ft)   LONG TERM GOALS: Target date: 01/13/2024  Patient will be independent with advanced HEP to allow him for self progression after discharge.  Baseline:  Goal status: INITIAL  2.  Patient will increase Lower Extremity Functional Scale to at least 65% to demonstrate improvements in functional mobility.  Baseline: 52.5% Goal status: INITIAL  3.  Patient will report ability to walk at least 20 minutes without increased pain to allow patient to ambulate community distances.  Baseline:  Goal status: INITIAL  4.  Patient will improve modified CTSIB to 120/120 to demonstrate decreased risk of falling.  Baseline:  Goal status: INITIAL  5.  Patient will increase UE/LE functional strength to [facility] to allow him to return to the gym.  Baseline:  Goal status: INITIAL     PLAN:  PT FREQUENCY: 1-2x/week  PT DURATION: 8 weeks  PLANNED INTERVENTIONS: 97164- PT Re-evaluation, 97750- Physical Performance Testing, 97110-Therapeutic exercises, 97530- Therapeutic activity, 97112- Neuromuscular re-education, 97535- Self Care, 02859- Manual therapy, [phone]- Gait training, [phone]- Canalith repositioning, [id number]- Aquatic Therapy, [phone]- Electrical stimulation (unattended), [phone]- Electrical stimulation (manual), [id number]- Vasopneumatic device, [id number]- Ultrasound, [id number]- Traction (mechanical), [id number]- Ionotophoresis 4mg /ml Dexamethasone , 79439 (1-2 muscles), 20561  (3+ muscles)- Dry Needling, Patient/Family education, Balance training, Stair training, Taping, Joint mobilization, Joint manipulation, Spinal manipulation, Spinal mobilization, Vestibular training, Cryotherapy, and Moist heat  PLAN FOR NEXT SESSION: Assess and progress HEP as indicated, strengthening, flexibility, manual/dry needling as indicated, ask about dizziness and if it is improved  Delon Darner, PTA 01/25/24 8:56 AM      [facility] [address], [address] [place], [place] [address] Phone # [phone] Fax [phone]  "

## 2024-01-26 ENCOUNTER — Encounter: Payer: Self-pay | Admitting: Rehabilitative and Restorative Service Providers"

## 2024-01-26 ENCOUNTER — Ambulatory Visit: Attending: Student | Admitting: Rehabilitative and Restorative Service Providers"

## 2024-01-26 DIAGNOSIS — M542 Cervicalgia: Secondary | ICD-10-CM | POA: Insufficient documentation

## 2024-01-26 DIAGNOSIS — M62838 Other muscle spasm: Secondary | ICD-10-CM | POA: Insufficient documentation

## 2024-01-26 DIAGNOSIS — R278 Other lack of coordination: Secondary | ICD-10-CM | POA: Diagnosis present

## 2024-01-26 DIAGNOSIS — R2689 Other abnormalities of gait and mobility: Secondary | ICD-10-CM | POA: Insufficient documentation

## 2024-01-26 DIAGNOSIS — M6281 Muscle weakness (generalized): Secondary | ICD-10-CM | POA: Diagnosis present

## 2024-01-26 DIAGNOSIS — R252 Cramp and spasm: Secondary | ICD-10-CM | POA: Insufficient documentation

## 2024-01-26 DIAGNOSIS — R293 Abnormal posture: Secondary | ICD-10-CM | POA: Insufficient documentation

## 2024-01-26 DIAGNOSIS — R262 Difficulty in walking, not elsewhere classified: Secondary | ICD-10-CM | POA: Diagnosis present

## 2024-01-26 NOTE — Therapy (Signed)
 " OUTPATIENT PHYSICAL THERAPY TREATMENT NOTE AND RE-CERTIFICATION NOTE   Patient Name: Jonathan Savage MRN: [id number] DOB:11-13-1940, 84 y.o., male Today's Date: 01/26/2024  Progress Note Reporting Period 11/18/2023 to 01/26/24  See note below for Objective Data and Assessment of Progress/Goals.       END OF SESSION:  PT End of Session - 01/26/24 0933     Visit Number 6    Date for Recertification  03/09/24    Authorization Type Medicare/BCBS    Progress Note Due on Visit 16    PT Start Time 0930    PT Stop Time 1010    PT Time Calculation (min) 40 min    Activity Tolerance Patient tolerated treatment well    Behavior During Therapy WFL for tasks assessed/performed           Past Medical History:  Diagnosis Date   Carotid artery occlusion    Complication of anesthesia    pt states following last surgery he was awake for over 24 hours following surgery   Hypertension    Stroke [facility])    Past Surgical History:  Procedure Laterality Date   ENDARTERECTOMY Left 08/22/2019   Procedure: LEFT CAROTID ENDARTERECTOMY WITH BOVIE PATCH ANGIOPLASTY;  Surgeon: Serene Gaile ORN, MD;  Location: [facility] OR;  Service: Vascular;  Laterality: Left;   LAPAROSCOPIC NEPHRECTOMY Right    NECK SURGERY     PROSTATE SURGERY     Patient Active Problem List   Diagnosis Date Noted   Diverticulosis 11/10/2023   Postural dizziness with near syncope 10/19/2023   Chronic constipation 10/19/2023   Neuropathy of both feet 05/09/2023   Numbness and tingling of both lower extremities 11/04/2022   Acute cough 10/25/2022   Lab test positive for detection of COVID-19 virus 10/25/2022   Erectile dysfunction 07/21/2022   Elevated serum creatinine 05/10/2022   CKD stage 3a, GFR 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) 05/10/2022   Prediabetes 10/29/2020   Carotid stenosis, asymptomatic 08/22/2019   Claudication in peripheral vascular disease 06/08/2019   Essential hypertension 06/05/2019   Hyperlipidemia 06/05/2019    Carotid bruit 06/05/2019    PCP: Knute Thersia Bitters, FNP  REFERRING PROVIDER: Emiliano Leonce CROME, PA-C  REFERRING DIAG: [phone] (ICD-10-CM) - Bilateral leg weakness  THERAPY DIAG:  Muscle weakness (generalized) - Plan: PT plan of care cert/re-cert  Balance problem - Plan: PT plan of care cert/re-cert  Cramp and spasm - Plan: PT plan of care cert/re-cert  Difficulty in walking, not elsewhere classified - Plan: PT plan of care cert/re-cert  Other lack of coordination - Plan: PT plan of care cert/re-cert  Abnormal posture - Plan: PT plan of care cert/re-cert  Cervicalgia - Plan: PT plan of care cert/re-cert  Neck muscle spasm - Plan: PT plan of care cert/re-cert  Rationale for Evaluation and Treatment: Rehabilitation  ONSET DATE: Summer 2025  SUBJECTIVE:   SUBJECTIVE STATEMENT:  Patient states that he is still having a lot of pressure when he sits.  Patient reports that  PERTINENT HISTORY: Essential Hypertension, Claudication in the peripheral vascular disease, carotid stenosis, pt has one kidney, Hx of syncope, recent COVID on 10/25/2022   PAIN:  Are you having pain? Yes: NPRS scale: 3/10 Pain location: generally lower legs, but sometimes right side of back Pain description: feet feels like a burning sensation, sharp on the right side of back Aggravating factors: walking, certain movements Relieving factors: rest  PRECAUTIONS: None  RED FLAGS: None   WEIGHT BEARING RESTRICTIONS: No  FALLS:  Has patient fallen  in last 6 months? No  LIVING ENVIRONMENT: Lives with: lives alone Lives in: House/apartment Stairs: one story Has following equipment at home: Grab bars  OCCUPATION: Retired  PLOF: Independent and Leisure: clocks, firearms/knives, cars, woodworking/construction  PATIENT GOALS: To strengthen my legs and get better balance and help with the numbness some  NEXT MD VISIT: PCP February 08, 2024  OBJECTIVE:  Note: Objective measures were completed  at Evaluation unless otherwise noted.  DIAGNOSTIC FINDINGS:  Thoracic Radiograph on 10/25/2022: IMPRESSION: No acute abnormality seen.  Lumbar Radiograph on 11/04/2022: IMPRESSION: Mild multilevel degenerative disc disease. No acute abnormality seen.  PATIENT SURVEYS:  LEFS  Extreme difficulty/unable (0), Quite a bit of difficulty (1), Moderate difficulty (2), Little difficulty (3), No difficulty (4) Survey date:  11/18/23 01/26/24  Any of your usual work, housework or school activities 2 2  2. Usual hobbies, recreational or sporting activities 2 2  3. Getting into/out of the bath 4 4  4. Walking between rooms 4 4  5. Putting on socks/shoes 4 4  6. Squatting  1 1  7. Lifting an object, like a bag of groceries from the floor 4 2  8. Performing light activities around your home 3 2  9. Performing heavy activities around your home 1 2  10. Getting into/out of a car 3 2  11. Walking 2 blocks 2 2  12. Walking 1 mile 0 1  13. Going up/down 10 stairs (1 flight) 3 1  14. Standing for 1 hour 2 1  15.  sitting for 1 hour 3 2  16. Running on even ground 0 1  17. Running on uneven ground 0 1  18. Making sharp turns while running fast 0 1  19. Hopping  0 2  20. Rolling over in bed 4 4  Score total:  42/80 = 52.5% 41/80= 51.2%     COGNITION: Overall cognitive status: Within functional limits for tasks assessed     SENSATION: Pt reports burning sensation in the bottoms of his feet   MUSCLE LENGTH: Hamstrings: WFL  POSTURE: rounded shoulders  PALPATION: Muscle spasms noted in right paraspinals  LOWER EXTREMITY ROM:  WFL  LOWER EXTREMITY MMT:  Eval: Bilateral quad strength of 5/5 Bilateral hip strength of 4 to 4+/5  LOWER EXTREMITY SPECIAL TESTS:  Slump test:  negative bilat  FUNCTIONAL TESTS:  Eval: 5 times sit to stand: 13.32 sec MCTSIB: Condition 1: 30 sec, Condition 2: 30 sec, Condition 3: 30 sec, Condition 4: 5 sec, and Total Score: 95/120  12/06/2023:  6 min  walk test:  979 ft  01/26/2024: 5 times sit to stand: 15.34 sec MCTSIB: Condition 1: 30 sec, Condition 2: 30 sec, Condition 3: 30 sec, Condition 4: 23 sec, and Total Score: 113/120  GAIT: Distance walked: >500 ft Assistive device utilized: None Level of assistance: Complete Independence Comments: Patient reports that sometimes he gets feeling weaker  VESTIBULAR ASSESSMENT:  12/06/2023: Smooth pursuit:  saccades noted when tracking from right back to mid-line Saccades:  WFL Gaze stabilization is [facility] Head Impulse Test:  slight nystamgus noted, but difficult to assess secondary to patient guarding movement Dix Hallpike:  negative to the left, positive to the right  01/26/2024: Trenda Craze positive on the right side  TREATMENT DATE:   01/26/2024: Nustep level 5 x 7 min with PT present to discuss status Lower Extremity Functional Scale:  41/80= 51.2% Sit to/from stand x5 Dix Hallpike positive on the right side, proceeded with canalith repositioning with Epley maneuver x1 Seated cervical retraction 2x10 Seated smooth pursuit:  horizontal x10, vertical x10 Seated pencil push up x10 Review of HEP and printed new handout and provided with new red and green tband   12/21/23: Pt arrives for aquatic physical therapy. Treatment took place in 3.5-5.5 feet of water. Water temperature was 91 degrees F. Pt entered the pool via stairs independently. Pt performed seated LE AROM exercises 20x in all planes, with concurrent discussion about current status. -75% depth water walk holding rainbow floats for balance in each direction 8x each. - high knee marching 4 length holding rainbow floats -heel raises 15x2 -hip 3 ways 15x Bil holding on for balance -Tandem stance holding rainbow weights for balance assist 30 sec 2x each side, then tandem walk 4 lengths holding 2 rainbow  floats -Single leg balance both LE 10 sec 3x:  -Side steps 2x10 on first step holding rail with 1 arm. -horseback bicycle in the corner 5 min, then Xcountry legs 10x   12/14/23:Pt arrives for aquatic physical therapy. Treatment took place in 3.5-5.5 feet of water. Water temperature was 90 degrees F. Pt entered the pool via stairs independently. Pt performed seated LE AROM exercises 20x in all planes, with concurrent discussion about current status and verbal education on water principles we will use. Pt verbally understood.  -75% depth water walk holding rainbow floats for balance in each direction 6x each. - high knee marching 4 length holding rainbow floats -heel raises 10x2 -hip 3 ways 15x Bil holding on for balance -Tandem stance holding rainbow weights for balance assist 30 sec 2x each side -horseback bicycle in the corner 3 min, then Xcountry legs 1 min   12/06/2023: Nustep level 6 x5 min with PT present to discuss status Vestibular assessment (see above) Dix Hallpike positive on the right.  Proceeded with canalith repositioning x2 with Epley maneuver 6 min walk test:  979 ft Seated shoulder horizontal abduction with red tband 2x10 Standing rows with red tband 2x10 Standing shoulder extension with red tband 2x10    PATIENT EDUCATION:  Education details: Issued HEP Person educated: Patient Education method: Explanation, Demonstration, and Handouts Education comprehension: verbalized understanding and returned demonstration  HOME EXERCISE PROGRAM: Access Code: [email or web address] URL: [email or web address] Date: 01/26/2024 Prepared by: Jarrell Laming  Exercises - Standing March with Counter Support  - 1 x daily - 7 x weekly - 2 sets - 10 reps - Standing Hip Abduction with Unilateral Counter Support  - 1 x daily - 7 x weekly - 2 sets - 10 reps - Standing Hip Extension with Unilateral Counter Support  - 1 x daily - 7 x weekly - 2 sets - 10 reps - Shoulder External  Rotation and Scapular Retraction with Resistance  - 1 x daily - 7 x weekly - 2 sets - 10 reps - Standing Shoulder Horizontal Abduction with Resistance  - 1 x daily - 7 x weekly - 2 sets - 10 reps - Standing Shoulder Row with Anchored Resistance  - 1 x daily - 7 x weekly - 2 sets - 10 reps - Standing Anti-Rotation Press with Anchored Resistance  - 1 x daily - 7 x weekly - 2 sets - 10 reps - Shoulder extension with resistance - Neutral  - 1  x daily - 7 x weekly - 2 sets - 10 reps - Squat with Chair Touch  - 1 x daily - 7 x weekly - 2 sets - 10 reps - Seated Hamstring Stretch  - 1 x daily - 7 x weekly - 2 reps - 20 sec hold - Hip Flexor Stretch with Chair  - 1 x daily - 7 x weekly - 2 reps - 20 sec hold - Seated Horizontal Smooth Pursuit  - 1 x daily - 7 x weekly - 2 sets - 10 reps - Seated Vertical Smooth Pursuit  - 1 x daily - 7 x weekly - 2 sets - 10 reps - Seated Proximal-Distal Smooth Pursuit  - 1 x daily - 7 x weekly - 2 sets - 10 reps  ASSESSMENT:  CLINICAL IMPRESSION:   Jonathan Savage presents to PT reporting that due to the holidays and having his colonoscopy, he had to miss several visits and does not feel that he was able to progress as much as he had hoped.  Patient with improved score on modified CTSIB noted, but with similar result on Lower Extremity Functional Scale.  Patient continues to have positive Trenda Craze and reports that his dizziness improved since maneuver last month, but he is still having some dizziness.  Performed canalith repositioning with Epley maneuver to treat right side.  Patient provided with visual tracking for smooth pursuit exercises, as noted some nystagmus on end range with horizontal tracking.  Patient would benefit from continued skilled PT of 1-2x/week for 8 more weeks to progress towards goal related activities.   OBJECTIVE IMPAIRMENTS: decreased balance, decreased coordination, difficulty walking, decreased strength, dizziness, increased muscle spasms,  postural dysfunction, and pain.   ACTIVITY LIMITATIONS: carrying, lifting, bending, standing, and locomotion level  PARTICIPATION LIMITATIONS: cleaning and community activity  PERSONAL FACTORS: Fitness, Time since onset of injury/illness/exacerbation, and 3+ comorbidities: dizziness, carotid stenosis, Claudication are also affecting patient's functional outcome.   REHAB POTENTIAL: Good  CLINICAL DECISION MAKING: Evolving/moderate complexity  EVALUATION COMPLEXITY: Moderate   GOALS: Goals reviewed with patient? Yes  SHORT TERM GOALS: Target date: 02/11/2024 Patient will be independent with initial HEP.  Baseline: Goal status: Ongoing  2.  Patient will report at least a 25% improvement in symptoms since initial evaluation.  Baseline:  Goal status: Ongoing  3.  Patient will participate in 6 minute walk test to establish a baseline.  Baseline:  Goal status: Met on 12/06/23 (979 ft)   LONG TERM GOALS: Target date: 03/09/2024  Patient will be independent with advanced HEP to allow him for self progression after discharge.  Baseline:  Goal status: Ongoing  2.  Patient will increase Lower Extremity Functional Scale to at least 65% to demonstrate improvements in functional mobility.  Baseline: 52.5% Goal status: Ongoing (see above)  3.  Patient will report ability to walk at least 20 minutes without increased pain to allow patient to ambulate community distances.  Baseline:  Goal status: Ongoing  4.  Patient will improve modified CTSIB to 120/120 to demonstrate decreased risk of falling.  Baseline:  Goal status: Ongoing (see above)  5.  Patient will increase UE/LE functional strength to [facility] to allow him to return to the gym.  Baseline:  Goal status: Ongoing  6.  Patient to report that his dizziness has improved at least 80% and reports improved confidence with his balance.  Baseline:  Goal status:  NEW on 01/26/2024     PLAN:  PT FREQUENCY: 1-2x/week  PT DURATION:  8  weeks  PLANNED INTERVENTIONS: 97164- PT Re-evaluation, 97750- Physical Performance Testing, 97110-Therapeutic exercises, 97530- Therapeutic activity, 97112- Neuromuscular re-education, [phone]- Self Care, 02859- Manual therapy, [phone]- Gait training, [phone]- Canalith repositioning, [phone]- Aquatic Therapy, [phone]- Electrical stimulation (unattended), [phone]- Electrical stimulation (manual), [id number]- Vasopneumatic device, [id number]- Ultrasound, [id number]- Traction (mechanical), [id number]- Ionotophoresis 4mg /ml Dexamethasone , 79439 (1-2 muscles), 20561 (3+ muscles)- Dry Needling, Patient/Family education, Balance training, Stair training, Taping, Joint mobilization, Joint manipulation, Spinal manipulation, Spinal mobilization, Vestibular training, Cryotherapy, and Moist heat  PLAN FOR NEXT SESSION: Assess and progress HEP as indicated, strengthening, flexibility, manual/dry needling as indicated, ask about dizziness and if it is improved   Jarrell Laming, PT, DPT 01/26/24, 10:40 AM  [facility] [address], [address] [place], [place] [address] Phone # [phone] Fax [phone]  "

## 2024-01-30 ENCOUNTER — Ambulatory Visit: Admitting: Rehabilitative and Restorative Service Providers"

## 2024-01-31 ENCOUNTER — Ambulatory Visit: Payer: Self-pay

## 2024-01-31 NOTE — Telephone Encounter (Signed)
 FYI Only or Action Required?: FYI only for provider: appointment scheduled on 1/28.  Patient was last seen in primary care on 11/10/2023 by Knute Thersia Bitters, FNP.  Called Nurse Triage reporting Prostate Check.  Symptoms began per daughter ongoing.  Symptoms are: unchanged.  Pt still having pressure with prostate, would like to know what to do next, per daughter/caller, pt has been seen for this. Pt recently had a colonoscopy, results WNL.  Summary: Prostate Increased pain and pressure   Reason for Triage: Pt daughter Veva calling reports pt has an enlarged prostate and is currently having increased pain and pressure.  Requesting a appt for evaluation as soon as possible.

## 2024-02-01 ENCOUNTER — Ambulatory Visit (INDEPENDENT_AMBULATORY_CARE_PROVIDER_SITE_OTHER): Admitting: Family Medicine

## 2024-02-01 ENCOUNTER — Ambulatory Visit: Admitting: Physical Therapy

## 2024-02-01 ENCOUNTER — Encounter (HOSPITAL_BASED_OUTPATIENT_CLINIC_OR_DEPARTMENT_OTHER): Payer: Self-pay | Admitting: Family Medicine

## 2024-02-01 VITALS — BP 148/78 | HR 76 | Ht 66.0 in | Wt 147.0 lb

## 2024-02-01 DIAGNOSIS — N4281 Prostatodynia syndrome: Secondary | ICD-10-CM | POA: Diagnosis not present

## 2024-02-01 DIAGNOSIS — R351 Nocturia: Secondary | ICD-10-CM

## 2024-02-01 LAB — POCT URINALYSIS DIPSTICK (MANUAL)
Leukocytes, UA: NEGATIVE
Nitrite, UA: NEGATIVE
Poct Bilirubin: NEGATIVE
Poct Blood: NEGATIVE
Poct Glucose: NORMAL mg/dL
Poct Ketones: NEGATIVE
Poct Urobilinogen: NORMAL mg/dL
Spec Grav, UA: 1.015
pH, UA: 6.5

## 2024-02-01 MED ORDER — SULFAMETHOXAZOLE-TRIMETHOPRIM 800-160 MG PO TABS: 1.0000 | ORAL_TABLET | Freq: Two times a day (BID) | ORAL | 0 refills | Status: DC

## 2024-02-01 NOTE — Patient Instructions (Addendum)
 Please start the antibiotic. If pain is improving in 1 week, please continue for the 2nd week.   Make sure to take a probiotic daily with the antibiotic.

## 2024-02-01 NOTE — Progress Notes (Signed)
 "    Subjective:   [facility] 1940/12/08 02/01/2024  Chief Complaint  Patient presents with   Medical Management of Chronic Issues    Pt would like to have his prostate checked. Also states he is still having pain in his side and has been having problems with gas pains.    Discussed the use of AI scribe software for clinical note transcription with the patient, who gave verbal consent to proceed.  History of Present Illness Jonathan Savage is an 84 year old male who presents with rectal pressure and urinary symptoms.  He experiences persistent rectal pressure and tenderness in the sacral area, which worsens throughout the day, especially when sitting for prolonged periods or on hard surfaces. The discomfort is mild in the morning but intensifies by the afternoon. He is unsure about having hemorrhoids but mentions 'a couple little spots' near the anus, described as 'puff spots' during his last colonoscopy in December. Bowel movements can be painful, and he sometimes feels sore in the area after defecation.  He has urinary symptoms, including nocturia and pain that subsides after urination. He also notices bubbles in his urine, describing it as looking like 'soap'. No fever is present, but he mentions chills when exposed to cold.  He has a history of a partial prostatectomy performed a long time ago due to a grossly enlarged prostate. He recalls a previous evaluation noting an enlarged prostate and gas in the lower intestines. He also mentions that a recent colonoscopy found a couple of polyps but otherwise reported normal findings.   The following portions of the patient's history were reviewed and updated as appropriate: past medical history, past surgical history, family history, social history, allergies, medications, and problem list.   Patient Active Problem List   Diagnosis Date Noted   Diverticulosis 11/10/2023   Postural dizziness with near syncope 10/19/2023    Chronic constipation 10/19/2023   Neuropathy of both feet 05/09/2023   Numbness and tingling of both lower extremities 11/04/2022   Acute cough 10/25/2022   Lab test positive for detection of COVID-19 virus 10/25/2022   Erectile dysfunction 07/21/2022   Elevated serum creatinine 05/10/2022   CKD stage 3a, GFR 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) 05/10/2022   Prediabetes 10/29/2020   Carotid stenosis, asymptomatic 08/22/2019   Claudication in peripheral vascular disease 06/08/2019   Essential hypertension 06/05/2019   Hyperlipidemia 06/05/2019   Carotid bruit 06/05/2019   Past Medical History:  Diagnosis Date   Carotid artery occlusion    Complication of anesthesia    pt states following last surgery he was awake for over 24 hours following surgery   Hypertension    Stroke [facility])    Past Surgical History:  Procedure Laterality Date   ENDARTERECTOMY Left 08/22/2019   Procedure: LEFT CAROTID ENDARTERECTOMY WITH BOVIE PATCH ANGIOPLASTY;  Surgeon: Serene Gaile ORN, MD;  Location: [facility] OR;  Service: Vascular;  Laterality: Left;   LAPAROSCOPIC NEPHRECTOMY Right    NECK SURGERY     PROSTATE SURGERY     Family History  Problem Relation Age of Onset   Dementia Mother    Heart failure Father        Deteriration of the heart   Stroke Brother    Stomach cancer Neg Hx    Rectal cancer Neg Hx    Esophageal cancer Neg Hx    Colon cancer Neg Hx    Outpatient Medications Prior to Visit  Medication Sig Dispense Refill   amLODipine  (NORVASC ) 2.5 MG tablet  TAKE 1 TABLET (2.5 MG TOTAL) BY MOUTH IN THE MORNING AND AT BEDTIME 180 tablet 1   aspirin  EC 81 MG tablet Take 81 mg by mouth daily.     hydrALAZINE  (APRESOLINE ) 10 MG tablet TAKE 1 TABLET (10 MG TOTAL) BY MOUTH IN THE MORNING AND AT BEDTIME 180 tablet 1   irbesartan  (AVAPRO ) 150 MG tablet Take 1 tablet (150 mg total) by mouth daily. Alone with 75mg  OF Irbesartan  90 tablet 3   irbesartan  (AVAPRO ) 75 MG tablet Take 1 tablet (75 mg total) by mouth daily. 90  tablet 3   Omega 3-6-9 Fatty Acids (OMEGA 3-6-9 COMPLEX PO) Take by mouth.     rosuvastatin  (CRESTOR ) 10 MG tablet TAKE 1 TABLET BY MOUTH EVERY DAY 90 tablet 3   vitamin B-12 (CYANOCOBALAMIN) 1000 MCG tablet Take 1,000 mcg by mouth daily.     vitamin C (ASCORBIC ACID) 500 MG tablet Take 500 mg by mouth daily.     XDEMVY 0.25 % SOLN INSTILL 1 DROP INTO BOTH EYES TWICE DAILY FOR 6 WEEKS     doxycycline (ADOXA) 50 MG tablet Take 50 mg by mouth daily. (Patient not taking: Reported on 01/17/2024)     No facility-administered medications prior to visit.   Allergies[1]   ROS: A complete ROS was performed with pertinent positives/negatives noted in the HPI. The remainder of the ROS are negative.    Objective:   Today's Vitals   02/01/24 1016  BP: (!) 148/78  Pulse: 76  SpO2: 99%  Weight: 147 lb (66.7 kg)  Height: 5' 6 (1.676 m)    Physical Exam   GENERAL: Well-appearing, in NAD. Well nourished.  SKIN: Pink, warm and dry. No rash, lesion, ulceration, or ecchymoses.  Head: Normocephalic. NECK: Trachea midline. Full ROM w/o pain or tenderness.  RESPIRATORY: Chest wall symmetrical. Respirations even and non-labored.  MSK: Muscle tone and strength appropriate for age.  RECTAL: Prostate mildly enlarged and tender. No internal or external hemorrhoids, no anal fissure. Chaperoned by: Damien Many, CMA EXTREMITIES: Without clubbing, cyanosis, or edema.  NEUROLOGIC: No motor or sensory deficits. Steady, even gait. C2-C12 intact.  PSYCH/MENTAL STATUS: Alert, oriented x 3. Cooperative, appropriate mood and affect.   Results for orders placed or performed in visit on 02/01/24  POCT Urinalysis Dip Manual   Collection Time: 02/01/24 10:48 AM  Result Value Ref Range   Spec Grav, UA 1.015 1.010 - 1.025   pH, UA 6.5 5.0 - 8.0   Leukocytes, UA Negative Negative   Nitrite, UA Negative Negative   Poct Protein trace Negative, trace mg/dL   Poct Glucose Normal Normal mg/dL   Poct Ketones Negative  Negative   Poct Urobilinogen Normal Normal mg/dL   Poct Bilirubin Negative Negative   Poct Blood Negative Negative, trace       Assessment & Plan:  1. Prostate pain (Primary) 2. Nocturia Concern for possible chronic prostatitis or prostatic hypertrophy. No signs of hemorrhoids or anal fissures on exam to contribute to pain. Urine in office is reassuring but will send for urine culture and obtain PSA, CBC with labs today. Will empirically treat with Bactrim  and follow up in 1-2 weeks as scheduled previously. Referral to Urology placed if no improvement of symptoms.  - POCT Urinalysis Dip Manual - PSA - CBC with Differential - Urine Culture - sulfamethoxazole -trimethoprim  (BACTRIM  DS) 800-160 MG tablet; Take 1 tablet by mouth 2 (two) times daily for 14 days.  Dispense: 28 tablet; Refill: 0 - Ambulatory referral to Urology  Meds ordered this encounter  Medications   sulfamethoxazole -trimethoprim  (BACTRIM  DS) 800-160 MG tablet    Sig: Take 1 tablet by mouth 2 (two) times daily for 14 days.    Dispense:  28 tablet    Refill:  0    Supervising Provider:   DE CUBA, RAYMOND J [[id number]]   Lab Orders         Urine Culture         PSA         CBC with Differential         POCT Urinalysis Dip Manual      Return if symptoms worsen or fail to improve.    Patient to reach out to office if new, worrisome, or unresolved symptoms arise or if no improvement in patient's condition. Patient verbalized understanding and is agreeable to treatment plan. All questions answered to patient's satisfaction.    Simona Rocque Olivia Toren Tucholski, FNP     [1]  Allergies Allergen Reactions   Hydrochlorothiazide     Other reaction(s): Other (See Comments) Leg pain    "

## 2024-02-02 ENCOUNTER — Ambulatory Visit (HOSPITAL_BASED_OUTPATIENT_CLINIC_OR_DEPARTMENT_OTHER): Payer: Self-pay | Admitting: Family Medicine

## 2024-02-02 ENCOUNTER — Encounter: Payer: Self-pay | Admitting: Rehabilitative and Restorative Service Providers"

## 2024-02-02 ENCOUNTER — Ambulatory Visit: Admitting: Rehabilitative and Restorative Service Providers"

## 2024-02-02 DIAGNOSIS — R278 Other lack of coordination: Secondary | ICD-10-CM

## 2024-02-02 DIAGNOSIS — R262 Difficulty in walking, not elsewhere classified: Secondary | ICD-10-CM

## 2024-02-02 DIAGNOSIS — R293 Abnormal posture: Secondary | ICD-10-CM

## 2024-02-02 DIAGNOSIS — R252 Cramp and spasm: Secondary | ICD-10-CM

## 2024-02-02 DIAGNOSIS — M6281 Muscle weakness (generalized): Secondary | ICD-10-CM

## 2024-02-02 DIAGNOSIS — R2689 Other abnormalities of gait and mobility: Secondary | ICD-10-CM

## 2024-02-02 DIAGNOSIS — M542 Cervicalgia: Secondary | ICD-10-CM

## 2024-02-02 DIAGNOSIS — M62838 Other muscle spasm: Secondary | ICD-10-CM

## 2024-02-02 LAB — PSA: Prostate Specific Ag, Serum: 4.3 ng/mL — ABNORMAL HIGH (ref 0.0–4.0)

## 2024-02-02 LAB — CBC WITH DIFFERENTIAL/PLATELET
Basophils Absolute: 0.1 10*3/uL (ref 0.0–0.2)
Basos: 1 %
EOS (ABSOLUTE): 0.2 10*3/uL (ref 0.0–0.4)
Eos: 3 %
Hematocrit: 40.6 % (ref 37.5–51.0)
Hemoglobin: 13.5 g/dL (ref 13.0–17.7)
Immature Grans (Abs): 0 10*3/uL (ref 0.0–0.1)
Immature Granulocytes: 0 %
Lymphocytes Absolute: 1.4 10*3/uL (ref 0.7–3.1)
Lymphs: 20 %
MCH: 30 pg (ref 26.6–33.0)
MCHC: 33.3 g/dL (ref 31.5–35.7)
MCV: 90 fL (ref 79–97)
Monocytes Absolute: 0.8 10*3/uL (ref 0.1–0.9)
Monocytes: 12 %
Neutrophils Absolute: 4.3 10*3/uL (ref 1.4–7.0)
Neutrophils: 64 %
Platelets: 321 10*3/uL (ref 150–450)
RBC: 4.5 x10E6/uL (ref 4.14–5.80)
RDW: 13.2 % (ref 11.6–15.4)
WBC: 6.8 10*3/uL (ref 3.4–10.8)

## 2024-02-02 NOTE — Therapy (Signed)
 " OUTPATIENT PHYSICAL THERAPY TREATMENT NOTE    Patient Name: Jonathan Savage MRN: [id number] DOB:[birth year], 84 y.o., male Today's Date: 02/02/2024    END OF SESSION:  PT End of Session - 02/02/24 1109     Visit Number 7    Date for Recertification  03/09/24    Authorization Type Medicare/BCBS    Progress Note Due on Visit 16    PT Start Time 1015    PT Stop Time 1100    PT Time Calculation (min) 45 min    Activity Tolerance Patient tolerated treatment well    Behavior During Therapy [facility] for tasks assessed/performed            Past Medical History:  Diagnosis Date   Carotid artery occlusion    Complication of anesthesia    pt states following last surgery he was awake for over 24 hours following surgery   Hypertension    Stroke [facility])    Past Surgical History:  Procedure Laterality Date   ENDARTERECTOMY Left 08/22/2019   Procedure: LEFT CAROTID ENDARTERECTOMY WITH BOVIE PATCH ANGIOPLASTY;  Surgeon: Serene Gaile ORN, MD;  Location: [facility] OR;  Service: Vascular;  Laterality: Left;   LAPAROSCOPIC NEPHRECTOMY Right    NECK SURGERY     PROSTATE SURGERY     Patient Active Problem List   Diagnosis Date Noted   Diverticulosis 11/10/2023   Postural dizziness with near syncope 10/19/2023   Chronic constipation 10/19/2023   Neuropathy of both feet 05/09/2023   Numbness and tingling of both lower extremities 11/04/2022   Acute cough 10/25/2022   Lab test positive for detection of COVID-19 virus 10/25/2022   Erectile dysfunction 07/21/2022   Elevated serum creatinine 05/10/2022   CKD stage 3a, GFR 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) 05/10/2022   Prediabetes 10/29/2020   Carotid stenosis, asymptomatic 08/22/2019   Claudication in peripheral vascular disease 06/08/2019   Essential hypertension 06/05/2019   Hyperlipidemia 06/05/2019   Carotid bruit 06/05/2019    PCP: Knute Thersia Bitters, FNP  REFERRING PROVIDER: Emiliano Leonce CROME, PA-C  REFERRING DIAG: [phone] (ICD-10-CM) -  Bilateral leg weakness  THERAPY DIAG:  Muscle weakness (generalized)  Balance problem  Cramp and spasm  Difficulty in walking, not elsewhere classified  Other lack of coordination  Abnormal posture  Cervicalgia  Neck muscle spasm  Rationale for Evaluation and Treatment: Rehabilitation  ONSET DATE: Summer 2025  SUBJECTIVE:   SUBJECTIVE STATEMENT:  Patient states he is doing pretty well, dizziness is better than last week, mostly when he is getting out of bed and rolling onto his side. He states he has pain in bilat knees and in cervical region both at 4/10.  PERTINENT HISTORY: Essential Hypertension, Claudication in the peripheral vascular disease, carotid stenosis, pt has one kidney, Hx of syncope, recent COVID on 10/25/2022   PAIN:  02/02/24 Are you having pain? Yes: NPRS scale: 4/10 Pain location: generally lower legs, but sometimes right side of back Pain description: feet feels like a burning sensation, sharp on the right side of back Aggravating factors: walking, certain movements Relieving factors: rest  PRECAUTIONS: None  RED FLAGS: None   WEIGHT BEARING RESTRICTIONS: No  FALLS:  Has patient fallen in last 6 months? No  LIVING ENVIRONMENT: Lives with: lives alone Lives in: House/apartment Stairs: one story Has following equipment at home: Grab bars  OCCUPATION: Retired  PLOF: Independent and Leisure: clocks, firearms/knives, cars, woodworking/construction  PATIENT GOALS: To strengthen my legs and get better balance and help with the numbness some  NEXT MD VISIT: PCP February 08, 2024  OBJECTIVE:  Note: Objective measures were completed at Evaluation unless otherwise noted.  DIAGNOSTIC FINDINGS:  Thoracic Radiograph on 10/25/2022: IMPRESSION: No acute abnormality seen.  Lumbar Radiograph on 11/04/2022: IMPRESSION: Mild multilevel degenerative disc disease. No acute abnormality seen.  PATIENT SURVEYS:  LEFS  Extreme difficulty/unable  (0), Quite a bit of difficulty (1), Moderate difficulty (2), Little difficulty (3), No difficulty (4) Survey date:  11/18/23 01/26/24  Any of your usual work, housework or school activities 2 2  2. Usual hobbies, recreational or sporting activities 2 2  3. Getting into/out of the bath 4 4  4. Walking between rooms 4 4  5. Putting on socks/shoes 4 4  6. Squatting  1 1  7. Lifting an object, like a bag of groceries from the floor 4 2  8. Performing light activities around your home 3 2  9. Performing heavy activities around your home 1 2  10. Getting into/out of a car 3 2  11. Walking 2 blocks 2 2  12. Walking 1 mile 0 1  13. Going up/down 10 stairs (1 flight) 3 1  14. Standing for 1 hour 2 1  15.  sitting for 1 hour 3 2  16. Running on even ground 0 1  17. Running on uneven ground 0 1  18. Making sharp turns while running fast 0 1  19. Hopping  0 2  20. Rolling over in bed 4 4  Score total:  42/80 = 52.5% 41/80= 51.2%     COGNITION: Overall cognitive status: Within functional limits for tasks assessed     SENSATION: Pt reports burning sensation in the bottoms of his feet   MUSCLE LENGTH: Hamstrings: WFL  POSTURE: rounded shoulders  PALPATION: Muscle spasms noted in right paraspinals  LOWER EXTREMITY ROM:  WFL  LOWER EXTREMITY MMT:  Eval: Bilateral quad strength of 5/5 Bilateral hip strength of 4 to 4+/5  LOWER EXTREMITY SPECIAL TESTS:  Slump test:  negative bilat  FUNCTIONAL TESTS:  Eval: 5 times sit to stand: 13.32 sec MCTSIB: Condition 1: 30 sec, Condition 2: 30 sec, Condition 3: 30 sec, Condition 4: 5 sec, and Total Score: 95/120  12/06/2023:  6 min walk test:  979 ft  01/26/2024: 5 times sit to stand: 15.34 sec MCTSIB: Condition 1: 30 sec, Condition 2: 30 sec, Condition 3: 30 sec, Condition 4: 23 sec, and Total Score: 113/120  GAIT: Distance walked: >500 ft Assistive device utilized: None Level of assistance: Complete Independence Comments: Patient  reports that sometimes he gets feeling weaker  VESTIBULAR ASSESSMENT:  12/06/2023: Smooth pursuit:  saccades noted when tracking from right back to mid-line Saccades:  WFL Gaze stabilization is [facility] Head Impulse Test:  slight nystamgus noted, but difficult to assess secondary to patient guarding movement Dix Hallpike:  negative to the left, positive to the right  01/26/2024: Trenda Craze positive on the right side  TREATMENT DATE:  02/02/2024: Nustep level 5 x 7 min with PT present to discuss status Seated cervical retraction 2x10 Seated cervical AROM stretching Seated cervical snag rotation x5 with 5 sec hold Seated cervical snap extension x10 with 5 sec hold  Standing toe taps on stairs x10, x20 with foam pad balance Standing on foam pad alternating reach 2x1 min Standing on foam pad with head turns 2x1 min Standing on foam pad with head up and down 2x1 min Standing on foam pad rows with red tband 2x15 Seated blue ball roll outs x10   01/26/2024: Nustep level 5 x 7 min with PT present to discuss status Lower Extremity Functional Scale:  41/80= 51.2% Sit to/from stand x5 [employer] positive on the right side, proceeded with canalith repositioning with Epley maneuver x1 Seated cervical retraction 2x10 Seated smooth pursuit:  horizontal x10, vertical x10 Seated pencil push up x10 Review of HEP and printed new handout and provided with new red and green tband   12/21/23: Pt arrives for aquatic physical therapy. Treatment took place in 3.5-5.5 feet of water. Water temperature was 91 degrees F. Pt entered the pool via stairs independently. Pt performed seated LE AROM exercises 20x in all planes, with concurrent discussion about current status. -75% depth water walk holding rainbow floats for balance in each direction 8x each. - high knee marching 4 length  holding rainbow floats -heel raises 15x2 -hip 3 ways 15x Bil holding on for balance -Tandem stance holding rainbow weights for balance assist 30 sec 2x each side, then tandem walk 4 lengths holding 2 rainbow floats -Single leg balance both LE 10 sec 3x:  -Side steps 2x10 on first step holding rail with 1 arm. -horseback bicycle in the corner 5 min, then Xcountry legs 10x     PATIENT EDUCATION:  Education details: Issued HEP Person educated: Patient Education method: Programmer, Multimedia, Facilities Manager, and Handouts Education comprehension: verbalized understanding and returned demonstration  HOME EXERCISE PROGRAM: Access Code: [email or web address] URL: [email or web address] Date: 01/26/2024 Prepared by: Jarrell Laming  Exercises - Standing March with Counter Support  - 1 x daily - 7 x weekly - 2 sets - 10 reps - Standing Hip Abduction with Unilateral Counter Support  - 1 x daily - 7 x weekly - 2 sets - 10 reps - Standing Hip Extension with Unilateral Counter Support  - 1 x daily - 7 x weekly - 2 sets - 10 reps - Shoulder External Rotation and Scapular Retraction with Resistance  - 1 x daily - 7 x weekly - 2 sets - 10 reps - Standing Shoulder Horizontal Abduction with Resistance  - 1 x daily - 7 x weekly - 2 sets - 10 reps - Standing Shoulder Row with Anchored Resistance  - 1 x daily - 7 x weekly - 2 sets - 10 reps - Standing Anti-Rotation Press with Anchored Resistance  - 1 x daily - 7 x weekly - 2 sets - 10 reps - Shoulder extension with resistance - Neutral  - 1 x daily - 7 x weekly - 2 sets - 10 reps - Squat with Chair Touch  - 1 x daily - 7 x weekly - 2 sets - 10 reps - Seated Hamstring Stretch  - 1 x daily - 7 x weekly - 2 reps - 20 sec hold - Hip Flexor Stretch with Chair  - 1 x daily - 7 x weekly - 2 reps - 20 sec hold - Seated Horizontal Smooth Pursuit  - 1 x daily -  7 x weekly - 2 sets - 10 reps - Seated Vertical Smooth Pursuit  - 1 x daily - 7 x weekly - 2 sets - 10 reps -  Seated Proximal-Distal Smooth Pursuit  - 1 x daily - 7 x weekly - 2 sets - 10 reps  ASSESSMENT:  CLINICAL IMPRESSION:  Mr Licciardi presents to PT reporting some pain in both knees and into his cervical neck. He mentions his dizziness feels better since epley last session and feels as though it is more unsteadiness. Cervical retraction still required cueing with tuck the chin. Patient reported cervical AROM sidebending caused some pain bilat and he appears to have limited range. Snags were added today which the patient reported feeling good. Balance exercises on the foam pad were added to the program to address unsteadiness, patient tolerated well. He stated he feels his unsteadiness as his weight shifts forwards. He would continue to benefit from PT to address balance, strength, and mobility to work towards his goals.  OBJECTIVE IMPAIRMENTS: decreased balance, decreased coordination, difficulty walking, decreased strength, dizziness, increased muscle spasms, postural dysfunction, and pain.   ACTIVITY LIMITATIONS: carrying, lifting, bending, standing, and locomotion level  PARTICIPATION LIMITATIONS: cleaning and community activity  PERSONAL FACTORS: Fitness, Time since onset of injury/illness/exacerbation, and 3+ comorbidities: dizziness, carotid stenosis, Claudication are also affecting patient's functional outcome.   REHAB POTENTIAL: Good  CLINICAL DECISION MAKING: Evolving/moderate complexity  EVALUATION COMPLEXITY: Moderate   GOALS: Goals reviewed with patient? Yes  SHORT TERM GOALS: Target date: 02/11/2024 Patient will be independent with initial HEP.  Baseline: Goal status: Ongoing  2.  Patient will report at least a 25% improvement in symptoms since initial evaluation.  Baseline:  Goal status: Ongoing  3.  Patient will participate in 6 minute walk test to establish a baseline.  Baseline:  Goal status: Met on 12/06/23 (979 ft)   LONG TERM GOALS: Target date: 03/09/2024  Patient  will be independent with advanced HEP to allow him for self progression after discharge.  Baseline:  Goal status: Ongoing  2.  Patient will increase Lower Extremity Functional Scale to at least 65% to demonstrate improvements in functional mobility.  Baseline: 52.5% Goal status: Ongoing (see above)  3.  Patient will report ability to walk at least 20 minutes without increased pain to allow patient to ambulate community distances.  Baseline:  Goal status: Ongoing  4.  Patient will improve modified CTSIB to 120/120 to demonstrate decreased risk of falling.  Baseline:  Goal status: Ongoing (see above)  5.  Patient will increase UE/LE functional strength to [facility] to allow him to return to the gym.  Baseline:  Goal status: Ongoing  6.  Patient to report that his dizziness has improved at least 80% and reports improved confidence with his balance.  Baseline:  Goal status:  NEW on 01/26/2024     PLAN:  PT FREQUENCY: 1-2x/week  PT DURATION: 8 weeks  PLANNED INTERVENTIONS: 97164- PT Re-evaluation, 97750- Physical Performance Testing, 97110-Therapeutic exercises, 97530- Therapeutic activity, [id number]- Neuromuscular re-education, 97535- Self Care, 02859- Manual therapy, [phone]- Gait training, [phone]- Canalith repositioning, [id number]- Aquatic Therapy, [phone]- Electrical stimulation (unattended), [phone]- Electrical stimulation (manual), [id number]- Vasopneumatic device, [id number]- Ultrasound, [id number]- Traction (mechanical), [id number]- Ionotophoresis 4mg /ml Dexamethasone , 79439 (1-2 muscles), 20561 (3+ muscles)- Dry Needling, Patient/Family education, Balance training, Stair training, Taping, Joint mobilization, Joint manipulation, Spinal manipulation, Spinal mobilization, Vestibular training, Cryotherapy, and Moist heat  PLAN FOR NEXT SESSION: Assess and progress HEP as indicated, strengthening, flexibility, manual/dry needling as indicated, ask  about dizziness and if it is improved   Arleene Harsh, SPT 02/02/24, 11:10  AM    I agree with the following treatment note after reviewing documentation. This session was performed under the supervision of a licensed clinician.  Jarrell Laming, PT, DPT 02/02/24, 11:10 AM  [facility] [address], [address] [place], [place] [address] Phone # [phone] Fax [phone]  "

## 2024-02-02 NOTE — Progress Notes (Signed)
 Hi Jonathan Savage, Your blood counts are stable without any signs of acute infection causing your white blood cell count to increase.  Your PSA is slightly elevated and expected after age 84 to be elevated to a certain extent.  Given the ongoing rectal/prostate pain, I would recommend continuing with the Bactrim  as directed and we will follow-up as scheduled next week.  Please go ahead and schedule an evaluation with urology given ongoing symptoms.

## 2024-02-04 LAB — URINE CULTURE

## 2024-02-06 NOTE — Progress Notes (Signed)
 Hi Jonathan Savage,  Your urine culture is negative for growth. How are you feeling with the Bactrim ? Per Dr. Nancyann note,you did have some internal hemorrhoids that were small seen during your colonoscopy. I would like you to continue the Bactrim  until follow up on Friday and also use sitz baths at least twice daily. Please let me know how your symptoms have been.

## 2024-02-07 ENCOUNTER — Ambulatory Visit: Admitting: Rehabilitative and Restorative Service Providers"

## 2024-02-08 ENCOUNTER — Encounter: Payer: Self-pay | Admitting: Urology

## 2024-02-08 ENCOUNTER — Ambulatory Visit: Admitting: Urology

## 2024-02-08 VITALS — BP 166/71 | HR 80 | Ht 66.0 in | Wt 147.0 lb

## 2024-02-08 DIAGNOSIS — Z905 Acquired absence of kidney: Secondary | ICD-10-CM

## 2024-02-08 DIAGNOSIS — R351 Nocturia: Secondary | ICD-10-CM

## 2024-02-08 DIAGNOSIS — N419 Inflammatory disease of prostate, unspecified: Secondary | ICD-10-CM | POA: Diagnosis not present

## 2024-02-08 DIAGNOSIS — R399 Unspecified symptoms and signs involving the genitourinary system: Secondary | ICD-10-CM

## 2024-02-08 LAB — URINALYSIS, ROUTINE W REFLEX MICROSCOPIC
Bilirubin, UA: NEGATIVE
Glucose, UA: NEGATIVE
Ketones, UA: NEGATIVE
Nitrite, UA: NEGATIVE
Protein,UA: NEGATIVE
RBC, UA: NEGATIVE
Specific Gravity, UA: 1.01 (ref 1.005–1.030)
Urobilinogen, Ur: 0.2 mg/dL (ref 0.2–1.0)
pH, UA: 6 (ref 5.0–7.5)

## 2024-02-08 LAB — MICROSCOPIC EXAMINATION

## 2024-02-08 LAB — BLADDER SCAN AMB NON-IMAGING

## 2024-02-08 MED ORDER — LEVOFLOXACIN 500 MG PO TABS: 500.0000 mg | ORAL_TABLET | Freq: Every day | ORAL | 0 refills | Status: AC

## 2024-02-08 NOTE — Progress Notes (Signed)
 "  Assessment: 1. Prostatitis, unspecified prostatitis type   2. Lower urinary tract symptoms   3. Solitary kidney, acquired      Plan: I personally reviewed the patient's chart including provider notes, lab results. Diagnosis and management of prostatitis discussed with the patient. Will discontinue Bactrim  as he is not tolerating this well. Begin Levaquin  500 mg daily x 21 days.  Prescription sent. Return to office in 6 weeks.  Chief Complaint:  Chief Complaint  Patient presents with   LUTS    History of Present Illness:  Jonathan Savage is a 84 y.o. male who is seen in consultation from [place], FNP for evaluation of lower urinary tract symptoms and rectal pressure. The rectal discomfort has been present for 2-3 months.  He reports persistent pressure in the rectal area which worsens throughout the day and when sitting for prolonged periods. He was recently seen by his PCP on 02/01/2024 for these symptoms.  He was apparently noted to have a enlarged and tender prostate on exam.  He was started on Bactrim  DS on 02/01/2024.  He has not taken the Bactrim  for several days due to GI upset which he associated with the antibiotic. He has some mild lower urinary tract symptoms with nocturia x 3.  He reports voiding with a good stream.  No dysuria or gross hematuria. 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  He also underwent a possible open simple prostatectomy for BPH.  These procedures were done in [name] . CT abdomen and pelvis without contrast from 10/25 showed a surgically absent right kidney, mild fullness of the left collecting system without stone.  Past Medical History:  Past Medical History:  Diagnosis Date   Carotid artery occlusion    Complication of anesthesia    pt states following last surgery he was awake for over 24 hours following  surgery   Hypertension    Stroke [facility])     Past Surgical History:  Past Surgical History:  Procedure Laterality Date   ENDARTERECTOMY Left 08/22/2019   Procedure: LEFT CAROTID ENDARTERECTOMY WITH BOVIE PATCH ANGIOPLASTY;  Surgeon: Serene Gaile ORN, MD;  Location: [facility] OR;  Service: Vascular;  Laterality: Left;   LAPAROSCOPIC NEPHRECTOMY Right    NECK SURGERY     PROSTATE SURGERY      Allergies:  Allergies[1]  Family History:  Family History  Problem Relation Age of Onset   Dementia Mother    Heart failure Father        Deteriration of the heart   Stroke Brother    Stomach cancer Neg Hx    Rectal cancer Neg Hx    Esophageal cancer Neg Hx    Colon cancer Neg Hx     Social History:  Social History[2]  Review of symptoms:  Constitutional:  Negative for unexplained weight loss, night sweats, fever, chills ENT:  Negative for nose bleeds, sinus pain, painful swallowing CV:  Negative for chest pain, shortness of breath, exercise intolerance, palpitations, loss of consciousness Resp:  Negative for cough, wheezing, shortness of breath GI:  Negative for nausea, vomiting, diarrhea, bloody stools GU:  Positives noted in HPI; otherwise negative for gross hematuria, dysuria, urinary incontinence Neuro:  Negative for seizures, poor balance, limb weakness, slurred speech Psych:  Negative for lack of energy, depression, anxiety Endocrine:  Negative for polydipsia, polyuria, symptoms of hypoglycemia (dizziness, hunger, sweating) Hematologic:  Negative  for anemia, purpura, petechia, prolonged or excessive bleeding, use of anticoagulants  Allergic:  Negative for difficulty breathing or choking as a result of exposure to anything; no shellfish allergy; no allergic response (rash/itch) to materials, foods  Physical exam: BP (!) 166/71   Pulse 80   Ht 5' 6 (1.676 m)   Wt 147 lb (66.7 kg)   BMI 23.73 kg/m  GENERAL APPEARANCE:  Well appearing, well developed, well nourished, NAD HEENT:  Atraumatic, Normocephalic, oropharynx clear. NECK: Supple without lymphadenopathy or thyromegaly. LUNGS: Clear to auscultation bilaterally. HEART: Regular Rate and Rhythm without murmurs, gallops, or rubs. ABDOMEN: Soft, non-tender, No Masses. EXTREMITIES: Moves all extremities well.  Without clubbing, cyanosis, or edema. NEUROLOGIC:  Alert and oriented x 3, normal gait, CN II-XII grossly intact.  MENTAL STATUS:  Appropriate. BACK:  Non-tender to palpation.  No CVAT SKIN:  Warm, dry and intact.   GU: Penis:  uncircumcised Meatus: Normal Scrotum: no erythema or edema Testis: normal without masses bilateral Epididymis: tenderness: left Prostate: 40 g, tender to palpation Rectum: Normal tone,  no masses or tenderness   Results: U/A: 0-5 WBC, 0-2 RBC  PVR =  0 ml     [1]  Allergies Allergen Reactions   Hydrochlorothiazide     Other reaction(s): Other (See Comments) Leg pain   [2]  Social History Tobacco Use   Smoking status: Former   Smokeless tobacco: Never  Vaping Use   Vaping status: Never Used  Substance Use Topics   Alcohol use: Never   Drug use: Never   "

## 2024-02-09 ENCOUNTER — Ambulatory Visit: Admitting: Rehabilitative and Restorative Service Providers"

## 2024-02-10 ENCOUNTER — Encounter (HOSPITAL_BASED_OUTPATIENT_CLINIC_OR_DEPARTMENT_OTHER): Payer: Self-pay | Admitting: Family Medicine

## 2024-02-10 ENCOUNTER — Ambulatory Visit (HOSPITAL_BASED_OUTPATIENT_CLINIC_OR_DEPARTMENT_OTHER): Admitting: Family Medicine

## 2024-02-10 VITALS — BP 149/71 | HR 84 | Ht 66.0 in | Wt 146.0 lb

## 2024-02-10 DIAGNOSIS — R1011 Right upper quadrant pain: Secondary | ICD-10-CM

## 2024-02-10 DIAGNOSIS — N411 Chronic prostatitis: Secondary | ICD-10-CM | POA: Insufficient documentation

## 2024-02-10 NOTE — Patient Instructions (Signed)
 [facility]:  [address], [place]  [phone]

## 2024-02-10 NOTE — Progress Notes (Signed)
 "    Subjective:   Jonathan Savage 11-16-1940 02/10/2024  Chief Complaint  Patient presents with   Medical Management of Chronic Issues    Pt's main concern for today's visit is the new medication he is currently prescribed. Wants to further discuss is with PCP prior to beginning med.    Discussed the use of AI scribe software for clinical note transcription with the patient, who gave verbal consent to proceed.  History of Present Illness Jonathan Savage is an 84 year old male with chronic prostatitis who presents with right-sided abdominal pain and concerns about medication side effects of levaquin  prescribed by Urology for treatment of chronic prostatitis.   He has been experiencing right-sided abdominal pain since Monday night, which is significant enough to wake him from sleep. The pain began as severe, waking him from sleep on Monday night, located on the side, and radiates to the back. It is accompanied by excessive gas and worsens when lying on his right side.He reports pain has improved since Monday and is only tender with palpation. He is concerned about the possibility of gallbladder issues, as he experiences pain in the right upper quadrant. He has a history of nephrectomy and is aware of the need for regular kidney function monitoring.  He has was seen by Urology in the past week due to prostate pain and confirmed dx of chronic prostatitis and was previously prescribed Bactrim  by PCP last week, which he discontinued due to gastrointestinal upset and bilateral arm pain. He has not started the prescribed Levaquin  due to concerns about potential side effects, including tendon rupture and muscle pain.    The following portions of the patient's history were reviewed and updated as appropriate: past medical history, past surgical history, family history, social history, allergies, medications, and problem list.   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   Numbness and tingling of both lower extremities 11/04/2022   Acute cough 10/25/2022   Lab test positive for detection of COVID-19 virus 10/25/2022   Erectile dysfunction 07/21/2022   Elevated serum creatinine 05/10/2022   CKD stage 3a, GFR 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) 05/10/2022   Prediabetes 10/29/2020   Carotid stenosis, asymptomatic 08/22/2019   Claudication in peripheral vascular disease 06/08/2019   Essential hypertension 06/05/2019   Hyperlipidemia 06/05/2019   Carotid bruit 06/05/2019   Past Medical History:  Diagnosis Date   Carotid artery occlusion    Complication of anesthesia    pt states following last surgery he was awake for over 24 hours following surgery   Hypertension    Stroke [facility])    Past Surgical History:  Procedure Laterality Date   ENDARTERECTOMY Left 08/22/2019   Procedure: LEFT CAROTID ENDARTERECTOMY WITH BOVIE PATCH ANGIOPLASTY;  Surgeon: Serene Gaile ORN, MD;  Location: [facility] OR;  Service: Vascular;  Laterality: Left;   LAPAROSCOPIC NEPHRECTOMY Right    NECK SURGERY     PROSTATE SURGERY     Family History  Problem Relation Age of Onset   Dementia Mother    Heart failure Father        Deteriration of the heart   Stroke Brother    Stomach cancer Neg Hx    Rectal cancer Neg Hx    Esophageal cancer Neg Hx    Colon cancer Neg Hx    Outpatient Medications Prior to Visit  Medication  Sig Dispense Refill   amLODipine  (NORVASC ) 2.5 MG tablet TAKE 1 TABLET (2.5 MG TOTAL) BY MOUTH IN THE MORNING AND AT BEDTIME 180 tablet 1   aspirin  EC 81 MG tablet Take 81 mg by mouth daily.     hydrALAZINE  (APRESOLINE ) 10 MG tablet TAKE 1 TABLET (10 MG TOTAL) BY MOUTH IN THE MORNING AND AT BEDTIME 180 tablet 1   irbesartan  (AVAPRO ) 150 MG tablet Take 1 tablet (150 mg total) by mouth daily. Alone with 75mg  OF Irbesartan  90 tablet 3    irbesartan  (AVAPRO ) 75 MG tablet Take 1 tablet (75 mg total) by mouth daily. 90 tablet 3   Omega 3-6-9 Fatty Acids (OMEGA 3-6-9 COMPLEX PO) Take by mouth.     rosuvastatin  (CRESTOR ) 10 MG tablet TAKE 1 TABLET BY MOUTH EVERY DAY 90 tablet 3   vitamin B-12 (CYANOCOBALAMIN) 1000 MCG tablet Take 1,000 mcg by mouth daily.     vitamin C (ASCORBIC ACID) 500 MG tablet Take 500 mg by mouth daily.     XDEMVY 0.25 % SOLN INSTILL 1 DROP INTO BOTH EYES TWICE DAILY FOR 6 WEEKS     levofloxacin  (LEVAQUIN ) 500 MG tablet Take 1 tablet (500 mg total) by mouth daily for 21 days. (Patient not taking: Reported on 02/10/2024) 21 tablet 0   No facility-administered medications prior to visit.   Allergies[1]   ROS: A complete ROS was performed with pertinent positives/negatives noted in the HPI. The remainder of the ROS are negative.    Objective:   Today's Vitals   02/10/24 1014  BP: (!) 149/71  Pulse: 84  SpO2: 99%  Weight: 146 lb (66.2 kg)  Height: 5' 6 (1.676 m)    Physical Exam   GENERAL: Well-appearing, in NAD. Well nourished.  SKIN: Pink, warm and dry.  Head: Normocephalic. NECK: Trachea midline. Full ROM w/o pain or tenderness.  RESPIRATORY: Chest wall symmetrical. Respirations even and non-labored. Breath sounds clear to auscultation bilaterally.  CARDIAC: S1, S2 present, regular rate and rhythm without murmur or gallops. Peripheral pulses 2+ bilaterally.  GI: Abdomen soft, mild tenderness to RUQ. No rigidity, distention. Normoactive bowel sounds. No rebound tenderness. No hepatomegaly or splenomegaly. No CVA tenderness.  MSK: Muscle tone and strength appropriate for age.  NEUROLOGIC: No motor or sensory deficits. Steady, even gait. C2-C12 intact.  PSYCH/MENTAL STATUS: Alert, oriented x 3. Cooperative, appropriate mood and affect.       Assessment & Plan:  1. RUQ pain (Primary) Will obtain US  ABD for concern of gallbladder stones versus cholecystitis. Will follow up with patient pending  results. Disussed dietary changes and red flag signs/symptoms.  - US  ABDOMEN LIMITED RUQ (LIVER/GB); Future  2. Chronic prostatitis Urology chart reviewed with patient and PCP agrees with treatment course of Levaquin . Discussed possible side effects and benefits of medication. Pt will start course and reach out to PCP if unable to tolerate. Recommend good hydration, continue probiotic and follow up with Urology as directed.   No orders of the defined types were placed in this encounter.  Lab Orders  No laboratory test(s) ordered today   No images are attached to the encounter or orders placed in the encounter.  Return in about 4 months (around 06/09/2024) for Chronic Follow up .    Patient to reach out to office if new, worrisome, or unresolved symptoms arise or if no improvement in patient's condition. Patient verbalized understanding and is agreeable to treatment plan. All questions answered to patient's satisfaction.    Sims Laday Olivia  Nickie Warwick, FNP    [1]  Allergies Allergen Reactions   Hydrochlorothiazide     Other reaction(s): Other (See Comments) Leg pain    "

## 2024-02-11 ENCOUNTER — Ambulatory Visit (HOSPITAL_BASED_OUTPATIENT_CLINIC_OR_DEPARTMENT_OTHER)

## 2024-02-13 ENCOUNTER — Ambulatory Visit: Admitting: Rehabilitative and Restorative Service Providers"

## 2024-02-16 ENCOUNTER — Ambulatory Visit: Admitting: Rehabilitative and Restorative Service Providers"

## 2024-02-20 ENCOUNTER — Ambulatory Visit: Admitting: Rehabilitative and Restorative Service Providers"

## 2024-02-23 ENCOUNTER — Ambulatory Visit: Admitting: Rehabilitative and Restorative Service Providers"

## 2024-02-27 ENCOUNTER — Ambulatory Visit: Admitting: Rehabilitative and Restorative Service Providers"

## 2024-02-29 ENCOUNTER — Ambulatory Visit: Admitting: Rehabilitative and Restorative Service Providers"

## 2024-03-05 ENCOUNTER — Ambulatory Visit: Admitting: Rehabilitative and Restorative Service Providers"

## 2024-03-07 ENCOUNTER — Ambulatory Visit: Admitting: Rehabilitative and Restorative Service Providers"

## 2024-03-22 ENCOUNTER — Ambulatory Visit: Admitting: Urology

## 2024-05-29 ENCOUNTER — Encounter (HOSPITAL_BASED_OUTPATIENT_CLINIC_OR_DEPARTMENT_OTHER)

## 2024-06-11 ENCOUNTER — Ambulatory Visit (HOSPITAL_BASED_OUTPATIENT_CLINIC_OR_DEPARTMENT_OTHER): Admitting: Family Medicine
